# Patient Record
Sex: Male | Born: 1945 | Race: Black or African American | Hispanic: No | Smoking: Current every day smoker
Health system: Southern US, Community
[De-identification: ages and names within clinical notes are randomized; demographics above are authoritative.]

## PROBLEM LIST (undated history)

## (undated) DIAGNOSIS — E785 Hyperlipidemia, unspecified: Secondary | ICD-10-CM

## (undated) DIAGNOSIS — I10 Essential (primary) hypertension: Secondary | ICD-10-CM

## (undated) DIAGNOSIS — I509 Heart failure, unspecified: Secondary | ICD-10-CM

## (undated) DIAGNOSIS — Z992 Dependence on renal dialysis: Secondary | ICD-10-CM

## (undated) DIAGNOSIS — I4892 Unspecified atrial flutter: Secondary | ICD-10-CM

## (undated) DIAGNOSIS — M1990 Unspecified osteoarthritis, unspecified site: Secondary | ICD-10-CM

## (undated) DIAGNOSIS — N186 End stage renal disease: Secondary | ICD-10-CM

## (undated) HISTORY — PX: AV FISTULA PLACEMENT: SHX1204

## (undated) HISTORY — PX: BACK SURGERY: SHX140

---

## 2004-01-29 ENCOUNTER — Other Ambulatory Visit: Payer: Self-pay

## 2004-02-17 ENCOUNTER — Other Ambulatory Visit: Payer: Self-pay

## 2004-11-02 ENCOUNTER — Ambulatory Visit: Payer: Self-pay | Admitting: Nephrology

## 2005-01-25 ENCOUNTER — Ambulatory Visit: Payer: Self-pay | Admitting: Nephrology

## 2005-03-25 ENCOUNTER — Ambulatory Visit: Payer: Self-pay | Admitting: Nephrology

## 2005-10-27 ENCOUNTER — Emergency Department: Payer: Self-pay | Admitting: Emergency Medicine

## 2006-06-26 ENCOUNTER — Ambulatory Visit: Payer: Self-pay | Admitting: Nephrology

## 2006-07-02 ENCOUNTER — Ambulatory Visit: Payer: Self-pay | Admitting: Nephrology

## 2006-08-08 ENCOUNTER — Ambulatory Visit: Payer: Self-pay | Admitting: Nephrology

## 2006-08-11 ENCOUNTER — Ambulatory Visit: Payer: Self-pay | Admitting: Nephrology

## 2006-09-04 ENCOUNTER — Ambulatory Visit: Payer: Self-pay | Admitting: Vascular Surgery

## 2006-09-04 ENCOUNTER — Other Ambulatory Visit: Payer: Self-pay

## 2006-09-11 ENCOUNTER — Ambulatory Visit: Payer: Self-pay | Admitting: Vascular Surgery

## 2006-10-17 ENCOUNTER — Ambulatory Visit: Payer: Self-pay | Admitting: Vascular Surgery

## 2006-12-03 ENCOUNTER — Other Ambulatory Visit: Payer: Self-pay

## 2006-12-03 ENCOUNTER — Ambulatory Visit: Payer: Self-pay | Admitting: Vascular Surgery

## 2006-12-04 ENCOUNTER — Ambulatory Visit: Payer: Self-pay | Admitting: Vascular Surgery

## 2007-02-18 ENCOUNTER — Ambulatory Visit: Payer: Self-pay | Admitting: Vascular Surgery

## 2007-02-26 ENCOUNTER — Ambulatory Visit: Payer: Self-pay | Admitting: Vascular Surgery

## 2007-05-04 ENCOUNTER — Ambulatory Visit: Payer: Self-pay | Admitting: Vascular Surgery

## 2007-09-01 ENCOUNTER — Other Ambulatory Visit: Payer: Self-pay

## 2007-09-01 ENCOUNTER — Ambulatory Visit: Payer: Self-pay | Admitting: Ophthalmology

## 2007-09-08 ENCOUNTER — Ambulatory Visit: Payer: Self-pay | Admitting: Ophthalmology

## 2007-10-27 ENCOUNTER — Ambulatory Visit: Payer: Self-pay | Admitting: Ophthalmology

## 2007-11-10 ENCOUNTER — Ambulatory Visit: Payer: Self-pay | Admitting: Ophthalmology

## 2009-05-14 ENCOUNTER — Inpatient Hospital Stay: Payer: Self-pay | Admitting: Internal Medicine

## 2009-06-05 ENCOUNTER — Inpatient Hospital Stay: Payer: Self-pay | Admitting: Internal Medicine

## 2010-02-15 ENCOUNTER — Inpatient Hospital Stay: Payer: Self-pay | Admitting: Internal Medicine

## 2010-02-15 ENCOUNTER — Ambulatory Visit: Payer: Self-pay | Admitting: Cardiovascular Disease

## 2010-03-20 ENCOUNTER — Observation Stay: Payer: Self-pay | Admitting: Internal Medicine

## 2010-03-29 ENCOUNTER — Emergency Department: Payer: Self-pay | Admitting: Emergency Medicine

## 2010-07-20 ENCOUNTER — Observation Stay: Payer: Self-pay | Admitting: Internal Medicine

## 2010-08-06 ENCOUNTER — Emergency Department: Payer: Self-pay | Admitting: Emergency Medicine

## 2010-11-15 ENCOUNTER — Inpatient Hospital Stay: Payer: Self-pay | Admitting: Internal Medicine

## 2011-06-05 ENCOUNTER — Ambulatory Visit: Payer: Self-pay | Admitting: Vascular Surgery

## 2011-08-24 ENCOUNTER — Inpatient Hospital Stay: Payer: Self-pay | Admitting: Specialist

## 2011-09-05 ENCOUNTER — Ambulatory Visit: Payer: Self-pay | Admitting: Internal Medicine

## 2011-11-18 ENCOUNTER — Emergency Department: Payer: Self-pay | Admitting: Emergency Medicine

## 2012-02-17 ENCOUNTER — Emergency Department: Payer: Self-pay | Admitting: *Deleted

## 2012-02-17 LAB — CBC
HCT: 37.4 % — ABNORMAL LOW (ref 40.0–52.0)
HGB: 12.4 g/dL — ABNORMAL LOW (ref 13.0–18.0)
MCH: 31.4 pg (ref 26.0–34.0)
MCHC: 33.1 g/dL (ref 32.0–36.0)
MCV: 95 fL (ref 80–100)
RBC: 3.94 10*6/uL — ABNORMAL LOW (ref 4.40–5.90)
RDW: 15.8 % — ABNORMAL HIGH (ref 11.5–14.5)

## 2012-02-17 LAB — COMPREHENSIVE METABOLIC PANEL
Albumin: 3.9 g/dL (ref 3.4–5.0)
Alkaline Phosphatase: 65 U/L (ref 50–136)
BUN: 40 mg/dL — ABNORMAL HIGH (ref 7–18)
Bilirubin,Total: 0.6 mg/dL (ref 0.2–1.0)
Calcium, Total: 8.3 mg/dL — ABNORMAL LOW (ref 8.5–10.1)
Creatinine: 9.36 mg/dL — ABNORMAL HIGH (ref 0.60–1.30)
EGFR (African American): 7 — ABNORMAL LOW
EGFR (Non-African Amer.): 6 — ABNORMAL LOW
Glucose: 70 mg/dL (ref 65–99)
Osmolality: 286 (ref 275–301)
Potassium: 4.7 mmol/L (ref 3.5–5.1)
SGPT (ALT): 26 U/L
Sodium: 139 mmol/L (ref 136–145)

## 2012-02-17 LAB — RAPID INFLUENZA A&B ANTIGENS

## 2012-02-25 ENCOUNTER — Ambulatory Visit: Payer: Self-pay | Admitting: Vascular Surgery

## 2012-02-25 LAB — POTASSIUM: Potassium: 5.1 mmol/L (ref 3.5–5.1)

## 2013-07-07 ENCOUNTER — Ambulatory Visit: Payer: Self-pay | Admitting: Nephrology

## 2015-02-13 ENCOUNTER — Inpatient Hospital Stay: Payer: Self-pay | Admitting: Internal Medicine

## 2015-02-13 DIAGNOSIS — I361 Nonrheumatic tricuspid (valve) insufficiency: Secondary | ICD-10-CM

## 2015-04-16 NOTE — Op Note (Signed)
PATIENT NAME:  Brian Fleming, Brian Fleming MR#:  [id number] DATE OF BIRTH:  Jun 14, 1946  DATE OF PROCEDURE:  02/25/2012  PREOPERATIVE DIAGNOSES:  1. Poorly functioning dialysis access with difficulty in cannulation.  2. End-stage renal disease requiring hemodialysis.    POSTOPERATIVE DIAGNOSES: 1. Poorly functioning dialysis access with difficulty in cannulation.  2. End-stage renal disease requiring hemodialysis.   PROCEDURE PERFORMED: Contrast injection left arm radiocephalic fistula.   PROCEDURE PERFORMED BY: Katha Cabal, MD    SEDATION: Versed 2 mg plus fentanyl 50 mcg administered IV. Continuous ECG, pulse oximetry, and cardiopulmonary monitoring was performed throughout the entire procedure by the interventional radiology nurse. Total sedation time was 45 minutes.   ACCESS: 5 French micro sheath left wrist fistula near the arterial anastomosis, antegrade direction.   CONTRAST USED: Isovue 20 mL.   FLUORO TIME: 0.3 minutes.   INDICATIONS: Mr. Cooling is a 69 year old gentleman who presents to [place] referred from the dialysis center. They have had difficulties with cannulation. Risks and benefits for contrast injection were reviewed. All questions were answered. The patient has agreed to proceed.   PROCEDURE: The patient is taken to Special Procedures and placed in the supine position. After adequate sedation is achieved, he is positioned with his left arm extended palm upward and prepped and draped in a sterile fashion. 1% lidocaine is infiltrated in the soft tissues near the anastomosis and in an antegrade direction a micropuncture needle is used to access the fistula. Microwire followed by micro sheath is inserted. Stopcock is placed. Images of the fistula and the proximal venous anatomy as well as the central venous anatomy are obtained with hand injection. Compression is used to visualize the arterial anastomosis. After images are reviewed, the sheath is pulled, light pressure is held, and  there are no immediate complications.   INTERPRETATION: Images of the fistula demonstrate the fistula itself is widely patent. Arterial anastomosis and the visualized portions of the brachial aorta is widely patent. Areas of the buttonholes appear to be matured and widely patent. More proximal venous anatomy and central venous anatomy does not demonstrate any significant strictures or stenoses.   SUMMARY: Adequate AV fistula for continued dialysis.  ____________________________ Katha Cabal, MD ggs:drc D: 02/26/2012 09:08:19 ET T: 02/26/2012 09:44:29 ET JOB#: [id number]  cc: Katha Cabal, MD, <Dictator> Mikeal Hawthorne. Brynda Greathouse, MD Katha Cabal MD ELECTRONICALLY SIGNED 02/28/2012 10:48

## 2015-04-23 NOTE — Discharge Summary (Signed)
PATIENT NAME:  Brian Fleming, Brian Fleming MR#:  [id number] DATE OF BIRTH:  1946/11/14  DATE OF ADMISSION:  02/13/2015 DATE OF DISCHARGE:  02/14/2015  ADMITTING PHYSICIAN: Gladstone Lighter, MD  DISCHARGING PHYSICIAN: Gladstone Lighter, MD  PRIMARY CARE PHYSICIAN: Nicky Pugh, MD  PRIMARY NEPHROLOGIST: Dr. Johnney Ou at [facility].   CONSULTATIONS IN HOSPITAL:  Nephrology by Dr. Murlean Iba.   DISCHARGE DIAGNOSES: 1.  Acute hypoxic respiratory failure.  2.  Pulmonary edema.  3.  Congestive heart failure, likely diastolic dysfunction.  4.  Hypertension.  5.  End-stage renal disease on Monday, Wednesday, Friday hemodialysis.  6.  Gastroesophageal reflex disease.   DISCHARGE HOME MEDICATIONS:  1.  Nephro-Vite 1 tablet p.o. daily.  2.  Renvela 800 mg tablets 1 tablet twice a day as needed with snacks.  3.  Renvela 800 mg tablets 3 tablets 3 times a day with meals.  4.  Sensipar 90 mg p.o. daily.  5.  Norvasc 10 mg p.o. daily.  6.  Ramipril 10 mg p.o. daily.  7.  Omeprazole 20 mg 2 capsules p.o. daily.  8.  Tramadol 50 mg p.o. b.i.d. p.r.n. for pain.  9.  Hydroxyzine 25 mg p.o. b.i.d. p.r.n. for itching.  10.  Benzonatate capsules 200 mg b.i.d. p.r.n. for cough.   DISCHARGE DIET: Renal diet.   DISCHARGE ACTIVITY: As tolerated.  FOLLOWUP INSTRUCTIONS: 1.  Follow up for dialysis per schedule on 02/15/2015.  2.  PCP to evaluate for oxygen in the near future.  3.  PCP follow-up in 1 to 2 weeks.   DIAGNOSTIC DATA: Prior to discharge: WBC 4.3, hemoglobin 10, hematocrit 31.4, platelet count 111,000.   Sodium 140, potassium 4.2, chloride 98, bicarb 31, BUN 50, creatinine 11.3, glucose 92 and calcium 8.6. Troponins barely elevated at 0.09.   Echo Doppler showing LV ejection fraction is 55% to 60%, impaired diastolic function.   Blood cultures negative.   Chest x-ray on admission: Findings consistent with CHF.   BRIEF HOSPITAL COURSE: [name] is a 69 year old African American male  with end-stage renal disease on Monday, Wednesday and Friday hemodialysis, and hypertension, who presented to the hospital on a Monday secondary to worsening shortness of breath. The patient states his dialysis session on Friday was normal. Over the weekend his breathing got worse, however noncompliant with diet and fluid intake. Chest x-ray was consistent with pulmonary edema.   Acute pulmonary edema secondary to noncompliance, diastolic dysfunction based on echo and also dialysis patient. He was dialyzed the day of admission, on Monday, and also on Tuesday to get more fluid off of him. His breathing has improved. He required 2 liters of O2 on admission, none at the time of discharge. However, the patient did mention that during each dialysis session, even as an outpatient, he is placed on oxygen, so he is advised to follow up with his PCP to see if he would qualify for any home O2 in the near future. His other home medications are being continued without any changes. His course has been otherwise stable.   DISCHARGE CONDITION: Stable.   DISCHARGE DISPOSITION: Home.   TIME SPENT ON DISCHARGE: 40 minutes.   ____________________________ Gladstone Lighter, MD rk:sb D: 02/16/2015 16:24:10 ET T: 02/17/2015 11:28:03 ET JOB#: [id number]  cc: Gladstone Lighter, MD, <Dictator> Mikeal Hawthorne. Brynda Greathouse, MD Justin Mend, MD Gladstone Lighter MD ELECTRONICALLY SIGNED 03/09/2015 17:43

## 2015-04-23 NOTE — H&P (Signed)
PATIENT NAME:  Brian Fleming, Brian Fleming MR#:  [id number] DATE OF BIRTH:  30-Apr-1946  DATE OF ADMISSION:  02/13/2015  ADMITTING PHYSICIAN: Gladstone Lighter, M.D.   PRIMARY CARE PHYSICIAN:  Nicky Pugh, MD  PRIMARY NEPHROLOGIST: Jannifer Hick, MD at [facility].   CHIEF COMPLAINT: Difficulty breathing.   HISTORY OF PRESENT ILLNESS: Brian Fleming is a 69 year old African American male with past medical history significant for hypertension, remote history of alcohol abuse, end-stage renal disease on hemodialysis for almost 11 years now. Presents to the hospital secondary to difficulty breathing going on for 2 days but worse last night. The patient says he is on a Monday, Wednesday, Friday dialysis and had his regular dialysis treatment on this past Friday. Since Saturday morning he has been having trouble breathing. No chest pain at the time. He said he might have been drinking too much soda at home. He denies any increased salt intake. He felt by yesterday his symptoms got worse. He could not sleep for 2 days in a row.  Last night he could not sleep because of his breathing getting worse and he was also having some chest tightness and presented to the hospital. The patient is not on any home oxygen but it seems they use oxygen for him during dialysis. Currently he is hypoxic with saturations 88% on 3 liters oxygen. He is due for dialysis today anyways and nephrology has been notified. His chest x-ray indicated pulmonary edema and he is being admitted for the same.   PAST MEDICAL HISTORY: 1.  End-stage renal disease on hemodialysis for 11 years.  2.  Hypertension.  3.  Gastroesophageal reflex disease.   PAST SURGICAL HISTORY:  1.  Hemorrhoidectomy.  2.  Bilateral cataract surgery.   ALLERGIES: No known drug allergies.   HOME MEDICATIONS:  1.  Nephro-Vite 1 tablet p.o. daily.  2.  Renvela 800 mg 3 tablets p.o. 3 times a day with meals and 1 tablet with snacks once a day.  3.  Omeprazole 40 mg p.o.  daily.  4.  Ramipril 10 mg p.o. daily.  5.  Hydroxyzine 25 mg p.o. b.i.d. p.r.n.  6.  Sensipar 90 mg p.o. daily.  7.  Norvasc 10 mg p.o. at bedtime.  8.  Tramadol 50 mg p.o. b.i.d. p.r.n.   SOCIAL HISTORY: He lives at home by himself.  He is ambulatory at baseline without any assistive devices. He is not on home oxygen. He used to drink a lot of alcohol about half a gallon and a pint of gin but quit more than 15 years ago. No current alcohol use. He continues to smoke but 1 pack lasts for almost a week.    FAMILY HISTORY: Father died of natural causes at old age. Mother died from suicide when she was in her 3s.  REVIEW OF SYSTEMS: CONSTITUTIONAL: No fever, fatigue, or weakness.  EYES: No blurred vision, double vision, or inflammation.  ENT: No tinnitus, ear pain, hearing loss, epistaxis, or discharge.  RESPIRATORY: Positive for cough, difficulty breathing, and wheezing. No chronic obstructive pulmonary disease.  CARDIOVASCULAR: Some chest tightness. No arrhythmia, palpitations, or syncope.  GASTROINTESTINAL: No nausea, vomiting, diarrhea, abdominal pain, hematemesis, or melena.  GENITOURINARY: The patient is oliguric at baseline. No frequency or incontinence.  ENDOCRINE: No polyuria, nocturia, thyroid problems, heat or cold intolerance.  HEMATOLOGY: No anemia, easy bruising, or bleeding.  SKIN: No acne, rash, or lesions.  MUSCULOSKELETAL: No neck, back, shoulder pain, arthritis, or gout.  NEUROLOGIC: No numbness, weakness, CVA, transient ischemic attack,  or seizures.  PSYCHOLOGICAL: No anxiety, insomnia, or depression.   PHYSICAL EXAMINATION:  VITAL SIGNS: Temperature 98.5 degrees Fahrenheit, pulse 91, respirations 27, blood pressure 178/90, pulse of 88% on 3 liters oxygen.  GENERAL: Well-built, well-nourished man lying in bed, not in any acute distress.  HEENT: Normocephalic, atraumatic. Pupils equal, round, and reacting to light. Anicteric sclerae, extraocular movements intact.   OROPHARYNX: Clear without erythema, mass, or exudates.  NECK: Supple. No thyromegaly, JVD or carotid bruits. No lymphadenopathy.  LUNGS: Diffuse course rhonchi/crackles heard throughout the lungs. No use of accessory muscles for breathing. No wheezing.  CARDIOVASCULAR: S1, S2, regular rate and rhythm. No murmurs, rubs, or gallops.  ABDOMEN: Soft, nontender, nondistended. No hepatosplenomegaly. Normal bowel sounds.  EXTREMITIES: 2+ pedal edema present. No clubbing or cyanosis. Dorsalis pedis pulses palpable bilaterally.  SKIN: No acne, rash, or lesions.  LYMPHATICS: No cervical lymphadenopathy.  NEUROLOGIC: Cranial nerves intact. No sequelae.  No motor or sensory deficit.  PSYCHOLOGICAL: The patient is awake, alert, and oriented x3.   LABORATORY DATA: WBC 7.1, hemoglobin 10.3, hematocrit 39.4, platelet count 126,000.  Sodium 141, potassium 4.2, chloride 98, bicarbonate 30, BUN 17, creatinine 13.66, glucose 100, and calcium of 8.1. ALT 13, AST 20, alkaline phosphatase 122, total bilirubin 0.4, albumin of 3.2, troponin 0.06. Chest x-ray showing cardiomegaly and an element of pulmonary edema. No pneumothorax. No acute osseous abnormalities noted. CK, CK-MB within normal limits.  EKG showing normal sinus rhythm, heart rate of 93, no acute ST-T wave abnormalities.   ASSESSMENT AND PLAN: A 69 year old male with hypertension, endstage renal disease on Monday, Wednesday, Friday hemodialysis, comes in hypoxic respiratory failure secondary to congestive heart failure exacerbation and acute pulmonary edema.  1.  Acute hypoxic respiratory failure secondary to pulmonary edema and congestive heart failure exacerbation. He seems to be on 3 liters oxygen now.  He does not use oxygen at home though they use oxygen for him during dialysis. He might need to be qualified for oxygen at home. We will dialyze him and check his saturations when he is off the oxygen. He will go home and follow-up with [facility] for home O2 needs.  Chest x-ray showing pulmonary edema as mentioned above. We will get dialysis today. Also an echocardiogram has been ordered.  2.  End-stage renal disease on Monday, Wednesday, and Friday hemodialysis.  Last hemodialysis on Friday.  3.  Hemodialysis today per schedule. Nephrology has been noticed for the need for urgent dialysis. Continue his supplements.  4.  Hypertension. Continue home medications.  5.  Deep vein thrombosis prophylaxis with subcutaneous heparin.   CODE STATUS: Full code.   TIME SPENT ON ADMISSION: 50 minutes.   ____________________________ Gladstone Lighter, MD rk:mc D: 02/13/2015 07:55:01 ET T: 02/13/2015 08:25:03 ET JOB#: [id number]  cc: Gladstone Lighter, MD, <Dictator> Mikeal Hawthorne. Brynda Greathouse, MD Justin Mend, MD  Gladstone Lighter MD ELECTRONICALLY SIGNED 03/09/2015 17:12

## 2015-08-20 ENCOUNTER — Emergency Department: Payer: Medicare Other

## 2015-08-20 ENCOUNTER — Inpatient Hospital Stay
Admission: EM | Admit: 2015-08-20 | Discharge: 2015-08-22 | DRG: 291 | Disposition: A | Discharge status: Home / Self Care | Payer: Medicare Other | Attending: Internal Medicine | Admitting: Internal Medicine

## 2015-08-20 ENCOUNTER — Encounter: Payer: Self-pay | Admitting: Emergency Medicine

## 2015-08-20 DIAGNOSIS — I5033 Acute on chronic diastolic (congestive) heart failure: Principal | ICD-10-CM | POA: Diagnosis present

## 2015-08-20 DIAGNOSIS — M1990 Unspecified osteoarthritis, unspecified site: Secondary | ICD-10-CM | POA: Diagnosis present

## 2015-08-20 DIAGNOSIS — F17210 Nicotine dependence, cigarettes, uncomplicated: Secondary | ICD-10-CM | POA: Diagnosis present

## 2015-08-20 DIAGNOSIS — Z992 Dependence on renal dialysis: Secondary | ICD-10-CM

## 2015-08-20 DIAGNOSIS — Z79899 Other long term (current) drug therapy: Secondary | ICD-10-CM | POA: Diagnosis not present

## 2015-08-20 DIAGNOSIS — I509 Heart failure, unspecified: Secondary | ICD-10-CM

## 2015-08-20 DIAGNOSIS — N186 End stage renal disease: Secondary | ICD-10-CM | POA: Diagnosis present

## 2015-08-20 DIAGNOSIS — J810 Acute pulmonary edema: Secondary | ICD-10-CM | POA: Diagnosis present

## 2015-08-20 DIAGNOSIS — Z9889 Other specified postprocedural states: Secondary | ICD-10-CM

## 2015-08-20 DIAGNOSIS — I120 Hypertensive chronic kidney disease with stage 5 chronic kidney disease or end stage renal disease: Secondary | ICD-10-CM | POA: Diagnosis present

## 2015-08-20 DIAGNOSIS — J9601 Acute respiratory failure with hypoxia: Secondary | ICD-10-CM | POA: Diagnosis present

## 2015-08-20 DIAGNOSIS — N2581 Secondary hyperparathyroidism of renal origin: Secondary | ICD-10-CM | POA: Diagnosis present

## 2015-08-20 DIAGNOSIS — R0902 Hypoxemia: Secondary | ICD-10-CM

## 2015-08-20 DIAGNOSIS — I959 Hypotension, unspecified: Secondary | ICD-10-CM | POA: Diagnosis present

## 2015-08-20 DIAGNOSIS — K5900 Constipation, unspecified: Secondary | ICD-10-CM | POA: Diagnosis present

## 2015-08-20 DIAGNOSIS — D631 Anemia in chronic kidney disease: Secondary | ICD-10-CM | POA: Diagnosis present

## 2015-08-20 DIAGNOSIS — E876 Hypokalemia: Secondary | ICD-10-CM | POA: Diagnosis present

## 2015-08-20 DIAGNOSIS — E785 Hyperlipidemia, unspecified: Secondary | ICD-10-CM | POA: Diagnosis present

## 2015-08-20 HISTORY — DX: Dependence on renal dialysis: Z99.2

## 2015-08-20 HISTORY — DX: Unspecified osteoarthritis, unspecified site: M19.90

## 2015-08-20 HISTORY — DX: Heart failure, unspecified: I50.9

## 2015-08-20 HISTORY — DX: Dependence on renal dialysis: N18.6

## 2015-08-20 HISTORY — DX: Hyperlipidemia, unspecified: E78.5

## 2015-08-20 HISTORY — DX: Essential (primary) hypertension: I10

## 2015-08-20 LAB — CBC WITH DIFFERENTIAL/PLATELET
Basophils Absolute: 0 10*3/uL (ref 0–0.1)
Basophils Relative: 1 %
EOS ABS: 0.1 10*3/uL (ref 0–0.7)
EOS PCT: 3 %
HCT: 31.8 % — ABNORMAL LOW (ref 40.0–52.0)
Hemoglobin: 10.5 g/dL — ABNORMAL LOW (ref 13.0–18.0)
LYMPHS ABS: 0.7 10*3/uL — AB (ref 1.0–3.6)
LYMPHS PCT: 17 %
MCH: 29.8 pg (ref 26.0–34.0)
MCHC: 32.8 g/dL (ref 32.0–36.0)
MCV: 90.8 fL (ref 80.0–100.0)
MONO ABS: 0.3 10*3/uL (ref 0.2–1.0)
Monocytes Relative: 6 %
Neutro Abs: 3 10*3/uL (ref 1.4–6.5)
Neutrophils Relative %: 73 %
PLATELETS: 159 10*3/uL (ref 150–440)
RBC: 3.5 MIL/uL — AB (ref 4.40–5.90)
RDW: 15.4 % — AB (ref 11.5–14.5)
WBC: 4.2 10*3/uL (ref 3.8–10.6)

## 2015-08-20 LAB — COMPREHENSIVE METABOLIC PANEL
ALT: 8 U/L — AB (ref 17–63)
AST: 14 U/L — ABNORMAL LOW (ref 15–41)
Albumin: 3.8 g/dL (ref 3.5–5.0)
Alkaline Phosphatase: 88 U/L (ref 38–126)
Anion gap: 13 (ref 5–15)
BUN: 27 mg/dL — AB (ref 6–20)
CALCIUM: 10.1 mg/dL (ref 8.9–10.3)
CO2: 30 mmol/L (ref 22–32)
Chloride: 99 mmol/L — ABNORMAL LOW (ref 101–111)
Creatinine, Ser: 10.33 mg/dL — ABNORMAL HIGH (ref 0.61–1.24)
GFR calc non Af Amer: 5 mL/min — ABNORMAL LOW (ref >60)
GFR, EST AFRICAN AMERICAN: 5 mL/min — AB (ref >60)
GLUCOSE: 89 mg/dL (ref 65–99)
Potassium: 3.2 mmol/L — ABNORMAL LOW (ref 3.5–5.1)
Sodium: 142 mmol/L (ref 135–145)
Total Bilirubin: 0.8 mg/dL (ref 0.3–1.2)
Total Protein: 7.3 g/dL (ref 6.5–8.1)

## 2015-08-20 LAB — TROPONIN I: Troponin I: 0.04 ng/mL — ABNORMAL HIGH (ref <0.031)

## 2015-08-20 LAB — GLUCOSE, CAPILLARY: Glucose-Capillary: 105 mg/dL — ABNORMAL HIGH (ref 65–99)

## 2015-08-20 MED ORDER — ACETAMINOPHEN 650 MG RE SUPP: 650.0000 mg | Freq: Four times a day (QID) | RECTAL | Status: DC | PRN

## 2015-08-20 MED ORDER — AMLODIPINE BESYLATE 10 MG PO TABS
10.0000 mg | ORAL_TABLET | Freq: Every day | ORAL | Status: DC
  Administered 2015-08-20 – 2015-08-21 (×2): 10 mg via ORAL
  Filled 2015-08-20 (×2): qty 1

## 2015-08-20 MED ORDER — FUROSEMIDE 10 MG/ML IJ SOLN
80.0000 mg | Freq: Once | INTRAMUSCULAR | Status: AC
  Administered 2015-08-20: 80 mg via INTRAVENOUS
  Filled 2015-08-20: qty 8

## 2015-08-20 MED ORDER — ONDANSETRON HCL 4 MG PO TABS: 4.0000 mg | ORAL_TABLET | Freq: Four times a day (QID) | ORAL | Status: DC | PRN

## 2015-08-20 MED ORDER — HEPARIN SODIUM (PORCINE) 5000 UNIT/ML IJ SOLN
5000.0000 [IU] | Freq: Three times a day (TID) | INTRAMUSCULAR | Status: DC
  Administered 2015-08-20 – 2015-08-22 (×5): 5000 [IU] via SUBCUTANEOUS
  Filled 2015-08-20 (×5): qty 1

## 2015-08-20 MED ORDER — ONDANSETRON HCL 4 MG/2ML IJ SOLN: 4.0000 mg | Freq: Four times a day (QID) | INTRAMUSCULAR | Status: DC | PRN

## 2015-08-20 MED ORDER — CINACALCET HCL 30 MG PO TABS
60.0000 mg | ORAL_TABLET | Freq: Every day | ORAL | Status: DC
  Administered 2015-08-21 – 2015-08-22 (×2): 60 mg via ORAL
  Filled 2015-08-20 (×2): qty 2

## 2015-08-20 MED ORDER — SEVELAMER CARBONATE 800 MG PO TABS
2400.0000 mg | ORAL_TABLET | Freq: Three times a day (TID) | ORAL | Status: DC
  Administered 2015-08-21 – 2015-08-22 (×4): 2400 mg via ORAL
  Filled 2015-08-20 (×4): qty 3

## 2015-08-20 MED ORDER — EPOETIN ALFA 10000 UNIT/ML IJ SOLN
4000.0000 [IU] | Freq: Once | INTRAMUSCULAR | Status: AC
  Administered 2015-08-20: 4000 [IU] via INTRAVENOUS

## 2015-08-20 MED ORDER — ACETAMINOPHEN 325 MG PO TABS: 650.0000 mg | ORAL_TABLET | Freq: Four times a day (QID) | ORAL | Status: DC | PRN

## 2015-08-20 MED ORDER — TRAMADOL HCL 50 MG PO TABS: 50.0000 mg | ORAL_TABLET | Freq: Four times a day (QID) | ORAL | Status: DC | PRN

## 2015-08-20 NOTE — Progress Notes (Signed)
Pre-hd tx 

## 2015-08-20 NOTE — ED Provider Notes (Signed)
[facility] Emergency Department Provider Note  Time seen: 11:04 AM  I have reviewed the triage vital signs and the nursing notes.   HISTORY  Chief Complaint Shortness of Breath    HPI Brian Fleming is a 69 y.o. male with a past medical history of end-stage renal disease on hemodialysis Monday, Wednesday, Friday presents the emergency department with shortness of breath. According to the patient he last received dialysis on Friday, but the machine malfunctioned and he only got half of his treatment. He states today he started feeling considerably short of breath so he came to the emergency department. Denies any chest pain or discomfort. Does not wear oxygen at home. On arrival to the emergency department the patient has a 76% oxygen saturation on room air. Patient denies any cough, congestion, fever.Describes his shortness of breath is moderate, worse with exertion.     Past Medical History  Diagnosis Date  . Renal disorder     There are no active problems to display for this patient.   Past Surgical History  Procedure Laterality Date  . Av fistula placement      No current outpatient prescriptions on file.  Allergies Review of patient's allergies indicates not on file.  No family history on file.  Social History Social History  Substance Use Topics  . Smoking status: Not on file  . Smokeless tobacco: Not on file  . Alcohol Use: Not on file    Review of Systems Constitutional: Negative for fever. Cardiovascular: Negative for chest pain. Respiratory: Positive shortness of breath. Gastrointestinal: Negative for abdominal pain, vomiting and diarrhea Neurological: Negative for headache 10-point ROS otherwise negative.  ____________________________________________   PHYSICAL EXAM:  VITAL SIGNS: ED Triage Vitals  Enc Vitals Group     BP --      Pulse Rate 08/20/15 1058 94     Resp 08/20/15 1058 16     Temp --      Temp src --       SpO2 08/20/15 1058 76 %     Weight 08/20/15 1058 229 lb (103.874 kg)     Height 08/20/15 1058 5\' 8"  (1.727 m)     Head Cir --      Peak Flow --      Pain Score 08/20/15 1058 0     Pain Loc --      Pain Edu? --      Excl. in [place]? --     Constitutional: Alert and oriented. Well appearing and in no distress. No respiratory distress. Eyes: Normal exam ENT   Mouth/Throat: Mucous membranes are moist. Cardiovascular: Normal rate, regular rhythm. No murmur Respiratory: Normal respiratory effort without tachypnea nor retractions. Breath sounds are clear and equal bilaterally. No rhonchi auscultated. Gastrointestinal: Soft and nontender. No distention.   Musculoskeletal: Nontender with normal range of motion in all extremities.  Neurologic:  Normal speech and language. No gross focal neurologic deficits  Skin:  Skin is warm, dry and intact.  Psychiatric: Mood and affect are normal. Speech and behavior are normal. Patient exhibits appropriate insight and judgment.  ____________________________________________    EKG  EKG reviewed and interpreted by myself shows atrial flutter at 96 bpm, narrow QRS, normal axis, nonspecific ST changes present. Including some mild lateral ST depression, no ST elevations noted.  ____________________________________________    RADIOLOGY  Interstitial edema chest x-ray.  ____________________________________________    INITIAL IMPRESSION / ASSESSMENT AND PLAN / ED COURSE  Pertinent labs & imaging results that were  available during my care of the patient were reviewed by me and considered in my medical decision making (see chart for details).  Patient with shortness of breath, hypoxic to 76% on arrival to the emergency department. We'll check labs, chest x-ray and closely monitor in the emergency department. Patient satting well on 5 L O2. No distress, no respiratory distress. Calm and cooperative.  Interstitial edema chest x-ray, labs otherwise  close to patient's baseline, slight troponin elevation 0.04. We will admit the patient for hypoxia likely requiring dialysis.  ____________________________________________   FINAL CLINICAL IMPRESSION(S) / ED DIAGNOSES  Dyspnea Hypoxia   Harvest Dark, MD 08/20/15 1353

## 2015-08-20 NOTE — ED Notes (Signed)
Pt arrived via EMS from home, states on Friday during dialysis the machine malfunctioned and was not able to complete the entire treatment.  Usual amount off is 500, but only took off 200. Pt reports starting to feel short of breath yesterday morning. Non-dependent on oxygen at home.

## 2015-08-20 NOTE — ED Notes (Signed)
Attempt x 3 to establish IV access and draw blood work.  Unable.  Dr. Kerman Passey alerted, will evaluate patient.

## 2015-08-20 NOTE — Progress Notes (Signed)
Post hd tx 

## 2015-08-20 NOTE — Progress Notes (Signed)
Hd tx start  

## 2015-08-20 NOTE — Progress Notes (Signed)
Subjective:  Patient presents from home for shortness of breath. He is known to our service from previous admissions. He reports that he did not miss any dialysis treatments. His last dialysis treatment was Friday  he was noted to be hypoxic in the emergency room with 76% on room air. He was placed on oxygen supplementation. He denies any fevers or chills. He vomited on Friday.     Objective:   Marland Kitchen Vital signs in last 24 hours:  Temp:  [98.6 F (37 C)] 98.6 F (37 C) (08/28 1554) Pulse Rate:  [77-94] 80 (08/28 1554) Resp:  [11-24] 20 (08/28 1554) BP: (171-203)/(82-100) 203/94 mmHg (08/28 1554) SpO2:  [76 %-100 %] 91 % (08/28 1554) Weight:  [103.874 kg (229 lb)] 103.874 kg (229 lb) (08/28 1058)  Weight change:  Filed Weights   08/20/15 1058  Weight: 103.874 kg (229 lb)    Intake/Output:   No intake or output data in the 24 hours ending 08/20/15 1603   Physical Exam: General:  no acute distress, laying in the bed   HEENT  anicteric, moist mucous membranes   Neck  supple, no masses   Pulm/lungs  bilateral diffuse crackles   CVS/Heart  regular rhythm no rub or gallop   Abdomen:   soft nontender   Extremities:  trace peripheral edema   Neurologic:  alert, oriented, speech normal   Skin:  no acute rashes   Access:  left forearm AV fistula        Basic Metabolic Panel:   Recent Labs Lab 08/20/15 1247  NA 142  K 3.2*  CL 99*  CO2 30  GLUCOSE 89  BUN 27*  CREATININE 10.33*  CALCIUM 10.1     CBC:  Recent Labs Lab 08/20/15 1247  WBC 4.2  NEUTROABS 3.0  HGB 10.5*  HCT 31.8*  MCV 90.8  PLT 159      Microbiology:  No results found for this or any previous visit (from the past 720 hour(s)).  Coagulation Studies: No results for input(s): LABPROT, INR in the last 72 hours.  Urinalysis: No results for input(s): COLORURINE, LABSPEC, PHURINE, GLUCOSEU, HGBUR, BILIRUBINUR, KETONESUR, PROTEINUR, UROBILINOGEN, NITRITE, LEUKOCYTESUR in the last 72  hours.  Invalid input(s): APPERANCEUR    Imaging: Dg Chest Portable 1 View  08/20/2015   CLINICAL DATA:  Shortness of breath starting yesterday.  EXAM: PORTABLE CHEST - 1 VIEW  COMPARISON:  February 13, 2015  FINDINGS: The heart size and mediastinal contours are stable. The heart size is enlarged. There is mild interstitial pulmonary edema. There are small bilateral pleural effusions. There is no focal pneumonia appear The visualized skeletal structures are unremarkable.  IMPRESSION: Mild interstitial pulmonary edema with small bilateral pleural effusions.   Electronically Signed   By: Abelardo Diesel M.D.   On: 08/20/2015 11:29     Medications:     . amLODipine  10 mg Oral QHS  . [START ON 08/21/2015] cinacalcet  60 mg Oral QPC breakfast  . furosemide  80 mg Intravenous Once  . heparin  5,000 Units Subcutaneous 3 times per day  . sevelamer carbonate  2,400 mg Oral TID WC   acetaminophen **OR** acetaminophen, ondansetron **OR** ondansetron (ZOFRAN) IV, traMADol  Assessment/ Plan:  69 y.o. African-American male with end-stage renal disease, hypertension, hyperlipidemia, degenerative disc disease presents for acute shortness of breath and is found to be hypoxic. He is admitted for urgent dialysis  1. End-stage renal disease. [place]. Monday Wednesday Friday. [facility] physicians. Patient is  acutely short of breath and is requiring oxygen supplementation We will arrange for urgent dialysis tonight. I have contacted the dialysis nurse on call. 2. Acute pulmonary edema Urgent dialysis tonight Ultrafiltration as tolerated 3. Anemia of chronic kidney disease. Current hemoglobin 10.5 We will start low-dose Procrit 4. Hypertension Likely volume driven Reassess after dialysis 5. Secondary hyperparathyroidism Monitor phosphorus during this admission.   LOS: 0 Sahithi Ordoyne 8/28/20164:03 PM

## 2015-08-20 NOTE — ED Notes (Signed)
Respirations regular and non labored.  Decreased O2 to 2L/Rake.  Continue to monitor.

## 2015-08-20 NOTE — Progress Notes (Signed)
HD tx end  

## 2015-08-20 NOTE — H&P (Signed)
Brian Fleming at [place] NAME: Brian Fleming    MR#:  [id number]  DATE OF BIRTH:  Sep 20, 1946  DATE OF ADMISSION:  08/20/2015  PRIMARY CARE PHYSICIAN: Marden Noble, MD   REQUESTING/REFERRING PHYSICIAN: Dr. Harvest Dark  CHIEF COMPLAINT:   Chief Complaint  Patient presents with  . Shortness of Breath    HISTORY OF PRESENT ILLNESS:  Brian Fleming  is a 69 y.o. male with a known history of end-stage renal disease on hemodialysis, hypertension, history of CHF, hyperlipidemia, degenerative disc disease, who presents to the hospital due to shortness of breath and noted to be hypoxic. Patient had his scheduled dialysis on Friday but as per the patient the only removed 200 cc of fluid and normally they remove 500 cc.  Patient's dialysis was cut short as the machine wasn't functioning properly. Patient then woke up Saturday morning feeling a bit short of breath but improved as the day progressed. This morning he woke up again with worsening shortness of breath and therefore came to the ER for further evaluation. Patient was noted to be hypoxic with O2 sats in the mid 70s on room air and had chest x-ray findings suggestive of pulmonary edema and bilateral pleural effusions. He was also noted to be slightly hypotensive but systolic blood pressures in 200s to 190s. Hospitalist services were contacted for further treatment and evaluation. Patient denies any recent increased salt intake, chest pain, nausea, vomiting, fever, chills or any other associated symptoms presently.  PAST MEDICAL HISTORY:   Past Medical History  Diagnosis Date  . End stage renal disease on dialysis   . Hypertension   . CHF (congestive heart failure)   . Hyperlipidemia   . DJD (degenerative joint disease)     PAST SURGICAL HISTORY:   Past Surgical History  Procedure Laterality Date  . Av fistula placement    . Back surgery      SOCIAL HISTORY:   Social  History  Substance Use Topics  . Smoking status: Current Every Day Smoker -- 0.25 packs/day    Types: Cigarettes  . Smokeless tobacco: Not on file  . Alcohol Use: No    FAMILY HISTORY:   Family History  Problem Relation Age of Onset  . Family history unknown: Yes    DRUG ALLERGIES:  No Known Allergies  REVIEW OF SYSTEMS:   Review of Systems  Constitutional: Negative for fever and weight loss.  HENT: Negative for congestion, nosebleeds and tinnitus.   Eyes: Negative for blurred vision, double vision and redness.  Respiratory: Positive for cough and shortness of breath. Negative for hemoptysis and sputum production.   Cardiovascular: Negative for chest pain, orthopnea, leg swelling and PND.  Gastrointestinal: Negative for nausea, vomiting, abdominal pain, diarrhea and melena.  Genitourinary: Negative for dysuria, urgency and hematuria.  Musculoskeletal: Negative for joint pain and falls.  Neurological: Negative for dizziness, tingling, sensory change, focal weakness, seizures, weakness and headaches.  Endo/Heme/Allergies: Negative for polydipsia. Does not bruise/bleed easily.  Psychiatric/Behavioral: Negative for depression and memory loss. The patient is not nervous/anxious.     MEDICATIONS AT HOME:   Prior to Admission medications   Medication Sig Start Date End Date Taking? Authorizing Provider  amLODipine (NORVASC) 10 MG tablet Take 10 mg by mouth at bedtime. 04/29/14  Yes Historical Provider, MD  cinacalcet (SENSIPAR) 60 MG tablet Take 60 mg by mouth daily.   Yes Historical Provider, MD  sevelamer carbonate (RENVELA) 800 MG tablet Take  2,400 mg by mouth See admin instructions. Take 3 tablets (2400mg ) orally 3 times a day with meals and take 1 tablet orally with a snack.   Yes Historical Provider, MD  traMADol (ULTRAM) 50 MG tablet Take by mouth every 8 (eight) hours as needed for moderate pain or severe pain.   Yes Historical Provider, MD      VITAL SIGNS:  Blood  pressure 191/88, pulse 77, resp. rate 16, height 5\' 8"  (1.727 m), weight 103.874 kg (229 lb), SpO2 100 %.  PHYSICAL EXAMINATION:  Physical Exam  GENERAL:  69 y.o.-year-old patient lying in the bed with no acute distress.  EYES: Pupils equal, round, reactive to light and accommodation. No scleral icterus. Extraocular muscles intact.  HEENT: Head atraumatic, normocephalic. Oropharynx and nasopharynx clear. No oropharyngeal erythema, moist oral mucosa  NECK:  Supple, no jugular venous distention. No thyroid enlargement, no tenderness.  LUNGS: Normal breath sounds bilaterally, no wheezing, rhonchi, + bibasilar rales. No use of accessory muscles of respiration.  CARDIOVASCULAR: S1, S2 RRR. No murmurs, rubs, gallops, clicks.  ABDOMEN: Soft, nontender, nondistended. Bowel sounds present. No organomegaly or mass.  EXTREMITIES: No pedal edema, cyanosis, or clubbing. + 2 pedal & radial pulses b/l.   NEUROLOGIC: Cranial nerves II through XII are intact. No focal Motor or sensory deficits appreciated b/l PSYCHIATRIC: The patient is alert and oriented x 3. Good affect.  SKIN: No obvious rash, lesion, or ulcer.   Left upper extremity AV fistula with good bruit and thrill.  LABORATORY PANEL:   CBC  Recent Labs Lab 08/20/15 1247  WBC 4.2  HGB 10.5*  HCT 31.8*  PLT 159   ------------------------------------------------------------------------------------------------------------------  Chemistries   Recent Labs Lab 08/20/15 1247  NA 142  K 3.2*  CL 99*  CO2 30  GLUCOSE 89  BUN 27*  CREATININE 10.33*  CALCIUM 10.1  AST 14*  ALT 8*  ALKPHOS 88  BILITOT 0.8   ------------------------------------------------------------------------------------------------------------------  Cardiac Enzymes  Recent Labs Lab 08/20/15 1247  TROPONINI 0.04*   ------------------------------------------------------------------------------------------------------------------  RADIOLOGY:  Dg Chest  Portable 1 View  08/20/2015   CLINICAL DATA:  Shortness of breath starting yesterday.  EXAM: PORTABLE CHEST - 1 VIEW  COMPARISON:  February 13, 2015  FINDINGS: The heart size and mediastinal contours are stable. The heart size is enlarged. There is mild interstitial pulmonary edema. There are small bilateral pleural effusions. There is no focal pneumonia appear The visualized skeletal structures are unremarkable.  IMPRESSION: Mild interstitial pulmonary edema with small bilateral pleural effusions.   Electronically Signed   By: Abelardo Diesel M.D.   On: 08/20/2015 11:29     IMPRESSION AND PLAN:   69 year old male with past medical history of hypertension, end-stage renal disease on hemodialysis, secondary hyperparathyroidism, CHF, DJD, who presents to the hospital due to shortness of breath and noted to be in acute respiratory failure with hypoxia.   #1 acute respiratory failure with hypoxia-this is likely secondary to CHF and volume overload. -Patient will get hemodialysis and will get extra fluid removed. -Continue O2 supplementation and we'll follow clinically.  #2 CHF-likely the cause of patient's shortness of breath and hypoxic respiratory failure. -I will go ahead and give the patient 1 dose of IV Lasix and patient will get hemodialysis today. -We'll follow clinically, wean O2 as tolerated.  #3 hypertension-continue Norvasc. Blood pressure is a bit uncontrolled but should improve after hemodialysis. -If needed will start on IV hydralazine as needed  #4 secondary hyperparathyroidism-continue Sensipar, Renvela.  #5 end-stage  renal disease on hemodialysis-patient normally gets dialysis on Monday Wednesday Friday. [facility] consult nephrology and patient to have an extra dialysis today.   All the records are reviewed and case discussed with ED provider. Management plans discussed with the patient, family and they are in agreement.  CODE STATUS: Full  TOTAL TIME TAKING CARE OF THIS  PATIENT: 45 minutes.    Henreitta Leber M.D on 08/20/2015 at 2:59 PM  Between 7am to 6pm - Pager - [phone]  After 6pm go to [email or web address] - password [email or web address] [facility]  Office  [phone]  CC: Primary care physician; Marden Noble, MD

## 2015-08-21 LAB — BASIC METABOLIC PANEL
Anion gap: 11 (ref 5–15)
BUN: 17 mg/dL (ref 6–20)
CHLORIDE: 98 mmol/L — AB (ref 101–111)
CO2: 32 mmol/L (ref 22–32)
Calcium: 9.4 mg/dL (ref 8.9–10.3)
Creatinine, Ser: 7.04 mg/dL — ABNORMAL HIGH (ref 0.61–1.24)
GFR calc non Af Amer: 7 mL/min — ABNORMAL LOW (ref >60)
GFR, EST AFRICAN AMERICAN: 8 mL/min — AB (ref >60)
Glucose, Bld: 99 mg/dL (ref 65–99)
POTASSIUM: 3.2 mmol/L — AB (ref 3.5–5.1)
SODIUM: 141 mmol/L (ref 135–145)

## 2015-08-21 LAB — CBC
HEMATOCRIT: 31.2 % — AB (ref 40.0–52.0)
HEMOGLOBIN: 10.5 g/dL — AB (ref 13.0–18.0)
MCH: 30.6 pg (ref 26.0–34.0)
MCHC: 33.6 g/dL (ref 32.0–36.0)
MCV: 91 fL (ref 80.0–100.0)
Platelets: 135 10*3/uL — ABNORMAL LOW (ref 150–440)
RBC: 3.42 MIL/uL — AB (ref 4.40–5.90)
RDW: 15.5 % — ABNORMAL HIGH (ref 11.5–14.5)
WBC: 3.9 10*3/uL (ref 3.8–10.6)

## 2015-08-21 MED ORDER — EPOETIN ALFA 10000 UNIT/ML IJ SOLN: 4000.0000 [IU] | INTRAMUSCULAR | Status: DC

## 2015-08-21 MED ORDER — CALCIUM CARBONATE ANTACID 500 MG PO CHEW
400.0000 mg | CHEWABLE_TABLET | Freq: Four times a day (QID) | ORAL | Status: DC | PRN
  Administered 2015-08-21: 400 mg via ORAL
  Filled 2015-08-21: qty 2

## 2015-08-21 MED ORDER — POLYETHYLENE GLYCOL 3350 17 G PO PACK
17.0000 g | PACK | Freq: Every day | ORAL | Status: DC
  Administered 2015-08-22: 17 g via ORAL
  Filled 2015-08-21: qty 1

## 2015-08-21 NOTE — Care Management (Signed)
Notified Dialysis Coordinator of admission.  It is reported patient had dialysis on Friday but session was cut short due to equipment malfunction. He presented to ED 8/28 for shortness of breath and admitted with heart failure.  His 02 is not chronic.  Says that if he has had fluid around his heart before- no one has told him. He does have CHF listed in history and physical in the past medical history.  Uses [facility] to get to dialysis and has a friend that provides transportation when patient has errands and grocery shopping.  He denies having problems accessing medical care or paying for medications.  Lives alone

## 2015-08-21 NOTE — Progress Notes (Signed)
Initial Nutrition Assessment   INTERVENTION:   Meals and Snacks: Cater to patient preferences Medical Food Supplement Therapy: will recommend on follow if intake poor Education: Discussed low sodium nutrition therapy with pt who reports not using salt on a regular basis. Will follow-up with more education on follow as pt asking for hot dog when RD left room and pt headed to HD.    NUTRITION DIAGNOSIS:   Inadequate oral intake related to poor appetite as evidenced by per patient/family report.  GOAL:   Patient will meet greater than or equal to 90% of their needs  MONITOR:    (Energy Intake, Electrolyte and Renal Profile, Anthropometrics)  REASON FOR ASSESSMENT:   Diagnosis    ASSESSMENT:   Pt admitted with SOB with h/o CHF and ESRD on HD. Per MD note, pt unable to finish HD session on Friday d/t malfunctioning equipment.  Past Medical History  Diagnosis Date  . End stage renal disease on dialysis   . Hypertension   . CHF (congestive heart failure)   . Hyperlipidemia   . DJD (degenerative joint disease)     Diet Order:  Diet renal with fluid restriction Fluid restriction:: 1200 mL Fluid; Room service appropriate?: Yes; Fluid consistency:: Thin    Current Nutrition: Pt reports eating 100% of hamburger for lunch. Recorded po intake 100% of meals since admission, pt reports eating toast and chicken for breakfast and noodles for dinner last night.   Food/Nutrition-Related History: Pt reports poor po intake for the past 2 weeks PTA feeling that he would vomit if he ate. Pt reports not drinking supplement PTA.    Medications: Sensipar, Miralax, Renvela  Electrolyte/Renal Profile and Glucose Profile:   Recent Labs Lab 08/20/15 1247 08/21/15 0459  NA 142 141  K 3.2* 3.2*  CL 99* 98*  CO2 30 32  BUN 27* 17  CREATININE 10.33* 7.04*  CALCIUM 10.1 9.4  GLUCOSE 89 99   Protein Profile:   Recent Labs Lab 08/20/15 1247  ALBUMIN 3.8    Gastrointestinal  Profile: Last BM:  08/19/2015   Nutrition-Focused Physical Exam Findings: Nutrition-Focused physical exam completed. Findings are WDL for fat depletion, muscle depletion, and edema.    Weight Change: Pt reports weight loss but does not know how much or his usual dry weight.    Skin:  Reviewed, no issues   Height:   Ht Readings from Last 1 Encounters:  08/20/15 5\' 8"  (1.727 m)    Weight:   Wt Readings from Last 1 Encounters:  08/20/15 205 lb 4 oz (93.1 kg)    Ideal Body Weight:  70 kg  BMI:  Body mass index is 31.22 kg/(m^2).  Estimated Nutritional Needs:   Kcal:  BEE: 1450kcals, TEE: (IF 1.2-1.4)(AF 1.2) [id number]  Protein:  84-105g protein (1.2-1.5g/kg)  Fluid:  UOP+1L  EDUCATION NEEDS:    Will review again on follow-up   [place], RD, LDN Pager [phone]

## 2015-08-21 NOTE — Progress Notes (Signed)
Spoke with dr. Vianne Bulls to make aware patient does not have an order for telemetry  And patients LBM was on 08/16/15, patient currently has nothing ordered. Per md she will place order for telemetry and assess patient. No new orders received [employer]

## 2015-08-21 NOTE — Care Management Note (Addendum)
Patient is active at [facility] on 1st shift.  Admission records have been sent and I will update with additional discharge records when that occurs.  Iran Sizer  Dialysis Liaison   [phone]

## 2015-08-21 NOTE — Progress Notes (Signed)
[facility] [facility] at [place] NAME: Brian Fleming    MR#:  [id number]  DATE OF BIRTH:  Dec 20, 1946  SUBJECTIVE: Admitted for shortness of breath and the acute hypoxia and O2 sats in mid 70s, uncontrolled hypertension. Patient received emergency hemodialysis yesterday. He feels better he says no shortness of breath no chest pain no cough. Last bowel movement was last Saturday.   CHIEF COMPLAINT:   Chief Complaint  Patient presents with  . Shortness of Breath    REVIEW OF SYSTEMS:    Review of Systems  Constitutional: Negative for fever and chills.  HENT: Negative for hearing loss.   Eyes: Negative for blurred vision, double vision and photophobia.  Respiratory: Negative for cough, hemoptysis and shortness of breath.   Cardiovascular: Negative for palpitations, orthopnea and leg swelling.  Gastrointestinal: Negative for vomiting, abdominal pain and diarrhea.  Genitourinary: Negative for dysuria and urgency.  Musculoskeletal: Negative for myalgias and neck pain.  Skin: Negative for rash.  Neurological: Negative for dizziness, focal weakness, seizures, weakness and headaches.  Psychiatric/Behavioral: Negative for memory loss. The patient does not have insomnia.     Nutrition:  Tolerating Diet: Tolerating PT:      DRUG ALLERGIES:  No Known Allergies  VITALS:  Blood pressure 175/90, pulse 80, temperature 97.9 F (36.6 C), temperature source Oral, resp. rate 19, height 5\' 8"  (1.727 m), weight 93.1 kg (205 lb 4 oz), SpO2 100 %.  PHYSICAL EXAMINATION:   Physical Exam  GENERAL:  69 y.o.-year-old patient lying in the bed with no acute distress.  EYES: Pupils equal, round, reactive to light and accommodation. No scleral icterus. Extraocular muscles intact.  HEENT: Head atraumatic, normocephalic. Oropharynx and nasopharynx clear.  NECK:  Supple, no jugular venous distention. No thyroid enlargement, no tenderness.  LUNGS: Normal breath  sounds bilaterally, no wheezing, rales,rhonchi or crepitation. No use of accessory muscles of respiration.  CARDIOVASCULAR: S1, S2 normal. No murmurs, rubs, or gallops.  ABDOMEN: Soft, nontender, nondistended. Bowel sounds present. No organomegaly or mass.  EXTREMITIES: No pedal edema, cyanosis, or clubbing.  NEUROLOGIC: Cranial nerves II through XII are intact. Muscle strength 5/5 in all extremities. Sensation intact. Gait not checked.  PSYCHIATRIC: The patient is alert and oriented x 3.  SKIN: No obvious rash, lesion, or ulcer.    LABORATORY PANEL:   CBC  Recent Labs Lab 08/21/15 0459  WBC 3.9  HGB 10.5*  HCT 31.2*  PLT 135*   ------------------------------------------------------------------------------------------------------------------  Chemistries   Recent Labs Lab 08/20/15 1247 08/21/15 0459  NA 142 141  K 3.2* 3.2*  CL 99* 98*  CO2 30 32  GLUCOSE 89 99  BUN 27* 17  CREATININE 10.33* 7.04*  CALCIUM 10.1 9.4  AST 14*  --   ALT 8*  --   ALKPHOS 88  --   BILITOT 0.8  --    ------------------------------------------------------------------------------------------------------------------  Cardiac Enzymes  Recent Labs Lab 08/20/15 1247  TROPONINI 0.04*   ------------------------------------------------------------------------------------------------------------------  RADIOLOGY:  Dg Chest Portable 1 View  08/20/2015   CLINICAL DATA:  Shortness of breath starting yesterday.  EXAM: PORTABLE CHEST - 1 VIEW  COMPARISON:  February 13, 2015  FINDINGS: The heart size and mediastinal contours are stable. The heart size is enlarged. There is mild interstitial pulmonary edema. There are small bilateral pleural effusions. There is no focal pneumonia appear The visualized skeletal structures are unremarkable.  IMPRESSION: Mild interstitial pulmonary edema with small bilateral pleural effusions.   Electronically Signed  By: Abelardo Diesel M.D.   On: 08/20/2015 11:29      ASSESSMENT AND PLAN:   Active Problems:   CHF (congestive heart failure)   1. Acute respiratory failure secondary to hypoxia: Secondary to inadequate hemodialysis. Had urgent HD last night,scheduled for his routine HD today,Hypoxia resolved.  #2 ESRD on hemodialysis Monday Wednesday Friday continue that. #3 uncontrolled hypertension and continue amlodipine and hydralazine for BP control and, monitored on telemetry. #4 constipation use of MiraLAX as needed patient last BM was Saturday.  Mild hypokalemia;K bath with HD today  All the records are reviewed and case discussed with Care Management/Social Workerr. Management plans discussed with the patient, family and they are in agreement.  CODE STATUS: full  TOTAL TIME TAKING CARE OF THIS PATIENT: 55 minutes.   POSSIBLE D/C IN 1-2 DAYS, DEPENDING ON CLINICAL CONDITION.   Epifanio Lesches M.D on 08/21/2015 at 11:19 AM  Between 7am to 6pm - Pager - [phone]  After 6pm go to [email or web address] - password [email or web address] [facility]  Office  [phone]  CC: Primary care physician; Marden Noble, MD

## 2015-08-21 NOTE — Progress Notes (Signed)
Subjective:  Had HD yesterday. Breathing comfortably this AM. Denies active shortness of breath. Due for HD again today per usual schedule.  Objective:  Vital signs in last 24 hours:  Temp:  [97.8 F (36.6 C)-98.7 F (37.1 C)] 97.9 F (36.6 C) (08/29 1111) Pulse Rate:  [77-95] 80 (08/29 1047) Resp:  [13-28] 19 (08/29 1047) BP: (156-214)/(81-100) 175/90 mmHg (08/29 1047) SpO2:  [91 %-100 %] 100 % (08/29 1047) Weight:  [93.1 kg (205 lb 4 oz)-96 kg (211 lb 10.3 oz)] 93.1 kg (205 lb 4 oz) (08/28 2156)  Weight change:  Filed Weights   08/20/15 1730 08/20/15 2123 08/20/15 2156  Weight: 96 kg (211 lb 10.3 oz) 93.2 kg (205 lb 7.5 oz) 93.1 kg (205 lb 4 oz)    Intake/Output:    Intake/Output Summary (Last 24 hours) at 08/21/15 1233 Last data filed at 08/21/15 0900  Gross per 24 hour  Intake    480 ml  Output   3000 ml  Net  -2520 ml     Physical Exam: General:  no acute distress, laying in the bed   HEENT  anicteric, moist mucous membranes   Neck  supple, no masses   Pulm/lungs  mild basilar rales normal effort  CVS/Heart  regular rhythm no rub or gallop   Abdomen:   soft nontender BS present  Extremities:  trace peripheral edema   Neurologic:  alert, oriented, speech normal   Skin:  no acute rashes   Access:  left forearm AV fistula        Basic Metabolic Panel:   Recent Labs Lab 08/20/15 1247 08/21/15 0459  NA 142 141  K 3.2* 3.2*  CL 99* 98*  CO2 30 32  GLUCOSE 89 99  BUN 27* 17  CREATININE 10.33* 7.04*  CALCIUM 10.1 9.4     CBC:  Recent Labs Lab 08/20/15 1247 08/21/15 0459  WBC 4.2 3.9  NEUTROABS 3.0  --   HGB 10.5* 10.5*  HCT 31.8* 31.2*  MCV 90.8 91.0  PLT 159 135*      Microbiology:  No results found for this or any previous visit (from the past 720 hour(s)).  Coagulation Studies: No results for input(s): LABPROT, INR in the last 72 hours.  Urinalysis: No results for input(s): COLORURINE, LABSPEC, PHURINE, GLUCOSEU, HGBUR,  BILIRUBINUR, KETONESUR, PROTEINUR, UROBILINOGEN, NITRITE, LEUKOCYTESUR in the last 72 hours.  Invalid input(s): APPERANCEUR    Imaging: Dg Chest Portable 1 View  08/20/2015   CLINICAL DATA:  Shortness of breath starting yesterday.  EXAM: PORTABLE CHEST - 1 VIEW  COMPARISON:  February 13, 2015  FINDINGS: The heart size and mediastinal contours are stable. The heart size is enlarged. There is mild interstitial pulmonary edema. There are small bilateral pleural effusions. There is no focal pneumonia appear The visualized skeletal structures are unremarkable.  IMPRESSION: Mild interstitial pulmonary edema with small bilateral pleural effusions.   Electronically Signed   By: Abelardo Diesel M.D.   On: 08/20/2015 11:29     Medications:     . amLODipine  10 mg Oral QHS  . cinacalcet  60 mg Oral QPC breakfast  . heparin  5,000 Units Subcutaneous 3 times per day  . polyethylene glycol  17 g Oral Daily  . sevelamer carbonate  2,400 mg Oral TID WC   acetaminophen **OR** acetaminophen, ondansetron **OR** ondansetron (ZOFRAN) IV, traMADol  Assessment/ Plan:  69 y.o. African-American male with end-stage renal disease, hypertension, hyperlipidemia, degenerative disc disease presents for acute shortness of  breath and is found to be hypoxic. He is admitted for urgent dialysis  1. End-stage renal disease. [employer]. Monday Wednesday Friday. [facility] physicians. Patient had HD yesterday, quite improved, will plan for HD again today per usual schedule.   2. Acute pulmonary edema Had urgent HD yesterday, tolerated well, will plan for additional HD as above today.  3. Anemia of chronic kidney disease. Hgb stable at 10.5, add epogen 4000 units IV with HD.   4. Hypertension BP still high today at 175/90, on amlodipine 10mg  po daily, reasses post HD again today.  5. Secondary hyperparathyroidism Continue renvela 2400mg  po TID/WM.   LOS: 1 Brian Fleming 8/29/201612:33 PM

## 2015-08-22 LAB — HEPATITIS B SURFACE ANTIGEN: HEP B S AG: NEGATIVE

## 2015-08-22 LAB — PLATELET COUNT: PLATELETS: 109 10*3/uL — AB (ref 150–440)

## 2015-08-22 MED ORDER — CALCIUM CARBONATE ANTACID 500 MG PO CHEW: 400.0000 mg | CHEWABLE_TABLET | Freq: Three times a day (TID) | ORAL | Status: DC

## 2015-08-22 MED ORDER — HYDRALAZINE HCL 25 MG PO TABS: 25.0000 mg | ORAL_TABLET | Freq: Three times a day (TID) | ORAL | Status: DC

## 2015-08-22 NOTE — Care Management Important Message (Signed)
Important Message  Patient Details  Name: Brian Fleming MRN: [id number] Date of Birth: 11/01/1946   Medicare Important Message Given:  Yes-second notification given    [name] 08/22/2015, 9:38 AM

## 2015-08-22 NOTE — Care Management (Signed)
Patient to discharge home today.  Will not require home 02.  Dialysis coordinator informed

## 2015-08-22 NOTE — Progress Notes (Signed)
Discharge instructions along with home medication list and follow up gone over with patient and girlfriend. Both verbalized that they understood instructions. Made patient aware prescriptions were electronically submitted. Iv and telemetry removed. Patient on RA. No distress noted. Patient to be discharged home. Will be taken off the floor via wheelchair by volunteer staff [employer]

## 2015-08-26 NOTE — Discharge Summary (Signed)
Brian Fleming, is a 69 y.o. male  DOB September 09, 1946  MRN [id number].  Admission date:  08/20/2015  Admitting Physician  Henreitta Leber, MD  Discharge Date:  08/22/2015   Primary MD  Marden Noble, MD  Recommendations for primary care physician for things to follow:   Follow-up primary doctor in 1 week   Admission Diagnosis  Acute pulmonary edema [J81.0] Hypoxia [R09.02]   Discharge Diagnosis  Acute pulmonary edema [J81.0] Hypoxia [R09.02]   Active Problems:   CHF (congestive heart failure)      Past Medical History  Diagnosis Date  . End stage renal disease on dialysis   . Hypertension   . CHF (congestive heart failure)   . Hyperlipidemia   . DJD (degenerative joint disease)     Past Surgical History  Procedure Laterality Date  . Av fistula placement    . Back surgery         History of present illness and  Hospital Course:     Kindly see H&P for history of present illness and admission details, please review complete Labs, Consult reports and Test reports for all details in brief  HPI  from the history and physical done on the day of admission  69 year old male with history of ESRD on hemodialysis, hypertension, hyperlipidemia came because of shortness of breath. Patient progressed on this Monday ,Wednesday ,Friday but the the Friday, August 26 he did not have further dialysis because machine was not functioning properly. Patient noted to have worsening shortness of breath on Saturday came to the emergency room and found to have hypoxia with O2 sat 70% on room air,Chest x-ray showing congestive heart failure. Patient appears also elevated systolic was [id number]. Admitted to hospitalist service secondary to acute hypoxic respiratory failure and malignant hypertension.    Hospital Course   #1 acute hypoxic  respiratory failure secondary to pulmonary edema in the contest of incomplete hemodialysis. Patient was seen by nephrology, had urgent hemodialysis on that today on August 28. And monitored on telemetry. Patient had regular hemodialysis again on Monday. Discharge home on now Tuesday, August 30. And the patient advised to rresume  his daily hemodialysis again on Wednesday. Oxygen saturations improved and he was 98% on room air when we discharged him.  2. Malignant hypertension improved with dialysis. Regime his home medications amlodipine, hydrallazine. #3 ESRD N hemanalysis Monday Wednesday Friday.    Discharge Condition:    Follow UP  Follow-up Information    Follow up with Marden Noble, MD. Go on 09/07/2015.   Specialty:  Internal Medicine   Why:  Time: 2:45 p.m.   Contact information:   [place] [address] [phone]       Follow up with [facility] for HD.   Why:  Please follow up at the dialysis center as scheduled        Discharge Instructions  and  Discharge Medications       Medication List    TAKE these medications        amLODipine 10 MG tablet  Commonly known as:  NORVASC  Take 10 mg by mouth at bedtime.     calcium carbonate 500 MG chewable tablet  Commonly known as:  TUMS - dosed in mg elemental calcium  Chew 2 tablets (400 mg of elemental calcium total) by mouth 3 (three) times daily.     cinacalcet 60 MG tablet  Commonly known as:  SENSIPAR  Take 60 mg by mouth  daily.     hydrALAZINE 25 MG tablet  Commonly known as:  APRESOLINE  Take 1 tablet (25 mg total) by mouth 3 (three) times daily.     sevelamer carbonate 800 MG tablet  Commonly known as:  RENVELA  Take 2,400 mg by mouth See admin instructions. Take 3 tablets (2400mg ) orally 3 times a day with meals and take 1 tablet orally with a snack.     traMADol 50 MG tablet  Commonly known as:  ULTRAM  Take by mouth every 8 (eight) hours as needed for moderate pain or severe pain.           Diet and Activity recommendation: See Discharge Instructions above   Consults obtained - nephrology   Major procedures and Radiology Reports - PLEASE review detailed and final reports for all details, in brief -      Dg Chest Portable 1 View  08/20/2015   CLINICAL DATA:  Shortness of breath starting yesterday.  EXAM: PORTABLE CHEST - 1 VIEW  COMPARISON:  February 13, 2015  FINDINGS: The heart size and mediastinal contours are stable. The heart size is enlarged. There is mild interstitial pulmonary edema. There are small bilateral pleural effusions. There is no focal pneumonia appear The visualized skeletal structures are unremarkable.  IMPRESSION: Mild interstitial pulmonary edema with small bilateral pleural effusions.   Electronically Signed   By: Abelardo Diesel M.D.   On: 08/20/2015 11:29    Micro Results     No results found for this or any previous visit (from the past 240 hour(s)).     Today   Subjective:   Brian Fleming today has no headache,no chest abdominal pain,no new weakness tingling or numbness, feels much better wants to go home today.   Objective:   Blood pressure 166/82, pulse 82, temperature 98.4 F (36.9 C), temperature source Oral, resp. rate 18, height 5\' 8"  (1.727 m), weight 90.3 kg (199 lb 1.2 oz), SpO2 99 %.  No intake or output data in the 24 hours ending 08/26/15 1210  Exam Awake Alert, Oriented x 3, No new F.N deficits, Normal affect [place].AT,PERRAL Supple Neck,No JVD, No cervical lymphadenopathy appriciated.  Symmetrical Chest wall movement, Good air movement bilaterally, CTAB RRR,No Gallops,Rubs or new Murmurs, No Parasternal Heave +ve B.Sounds, Abd Soft, Non tender, No organomegaly appriciated, No rebound -guarding or rigidity. No Cyanosis, Clubbing or edema, No new Rash or bruise  Data Review   CBC w Diff:  Lab Results  Component Value Date   WBC 3.9 08/21/2015   WBC 5.0 02/17/2012   HGB 10.5* 08/21/2015   HGB 12.4*  02/17/2012   HCT 31.2* 08/21/2015   HCT 37.4* 02/17/2012   PLT 109* 08/22/2015   PLT 71* 02/17/2012   LYMPHOPCT 17 08/20/2015   MONOPCT 6 08/20/2015   EOSPCT 3 08/20/2015   BASOPCT 1 08/20/2015    CMP:  Lab Results  Component Value Date   NA 141 08/21/2015   NA 139 02/17/2012   K 3.2* 08/21/2015   K 5.1 02/25/2012   CL 98* 08/21/2015   CL 98 02/17/2012   CO2 32 08/21/2015   CO2 22 02/17/2012   BUN 17 08/21/2015   BUN 40* 02/17/2012   CREATININE 7.04* 08/21/2015   CREATININE 9.36* 02/17/2012   PROT 7.3 08/20/2015   PROT 8.2 02/17/2012   ALBUMIN 3.8 08/20/2015   ALBUMIN 3.9 02/17/2012   BILITOT 0.8 08/20/2015   BILITOT 0.6 02/17/2012   ALKPHOS 88 08/20/2015   ALKPHOS 65 02/17/2012  AST 14* 08/20/2015   AST 31 02/17/2012   ALT 8* 08/20/2015   ALT 26 02/17/2012  .   Total Time in preparing paper work, data evaluation and todays exam - 37 minutes  Etha Stambaugh M.D on 08/22/2015 at 12:10 PM

## 2016-04-11 ENCOUNTER — Emergency Department: Payer: Medicare Other

## 2016-04-11 ENCOUNTER — Encounter: Payer: Self-pay | Admitting: Emergency Medicine

## 2016-04-11 ENCOUNTER — Observation Stay
Admission: EM | Admit: 2016-04-11 | Discharge: 2016-04-12 | Disposition: A | Discharge status: Home / Self Care | Payer: Medicare Other | Attending: Internal Medicine | Admitting: Internal Medicine

## 2016-04-11 DIAGNOSIS — R262 Difficulty in walking, not elsewhere classified: Secondary | ICD-10-CM | POA: Diagnosis not present

## 2016-04-11 DIAGNOSIS — N186 End stage renal disease: Secondary | ICD-10-CM | POA: Diagnosis not present

## 2016-04-11 DIAGNOSIS — Z79899 Other long term (current) drug therapy: Secondary | ICD-10-CM | POA: Diagnosis not present

## 2016-04-11 DIAGNOSIS — F17210 Nicotine dependence, cigarettes, uncomplicated: Secondary | ICD-10-CM | POA: Insufficient documentation

## 2016-04-11 DIAGNOSIS — I639 Cerebral infarction, unspecified: Secondary | ICD-10-CM

## 2016-04-11 DIAGNOSIS — J069 Acute upper respiratory infection, unspecified: Secondary | ICD-10-CM | POA: Diagnosis present

## 2016-04-11 DIAGNOSIS — R531 Weakness: Principal | ICD-10-CM

## 2016-04-11 DIAGNOSIS — Z9889 Other specified postprocedural states: Secondary | ICD-10-CM | POA: Diagnosis not present

## 2016-04-11 DIAGNOSIS — Z8249 Family history of ischemic heart disease and other diseases of the circulatory system: Secondary | ICD-10-CM | POA: Insufficient documentation

## 2016-04-11 DIAGNOSIS — R55 Syncope and collapse: Secondary | ICD-10-CM | POA: Diagnosis not present

## 2016-04-11 DIAGNOSIS — R471 Dysarthria and anarthria: Secondary | ICD-10-CM

## 2016-04-11 DIAGNOSIS — R4781 Slurred speech: Secondary | ICD-10-CM | POA: Diagnosis not present

## 2016-04-11 DIAGNOSIS — E785 Hyperlipidemia, unspecified: Secondary | ICD-10-CM | POA: Insufficient documentation

## 2016-04-11 DIAGNOSIS — W19XXXA Unspecified fall, initial encounter: Secondary | ICD-10-CM | POA: Insufficient documentation

## 2016-04-11 DIAGNOSIS — Z992 Dependence on renal dialysis: Secondary | ICD-10-CM | POA: Insufficient documentation

## 2016-04-11 DIAGNOSIS — I509 Heart failure, unspecified: Secondary | ICD-10-CM | POA: Insufficient documentation

## 2016-04-11 DIAGNOSIS — N2581 Secondary hyperparathyroidism of renal origin: Secondary | ICD-10-CM | POA: Insufficient documentation

## 2016-04-11 DIAGNOSIS — I120 Hypertensive chronic kidney disease with stage 5 chronic kidney disease or end stage renal disease: Secondary | ICD-10-CM | POA: Insufficient documentation

## 2016-04-11 DIAGNOSIS — I6523 Occlusion and stenosis of bilateral carotid arteries: Secondary | ICD-10-CM | POA: Insufficient documentation

## 2016-04-11 DIAGNOSIS — R739 Hyperglycemia, unspecified: Secondary | ICD-10-CM | POA: Insufficient documentation

## 2016-04-11 DIAGNOSIS — D631 Anemia in chronic kidney disease: Secondary | ICD-10-CM | POA: Diagnosis not present

## 2016-04-11 DIAGNOSIS — R569 Unspecified convulsions: Secondary | ICD-10-CM | POA: Insufficient documentation

## 2016-04-11 LAB — CBC WITH DIFFERENTIAL/PLATELET
BASOS ABS: 0 10*3/uL (ref 0–0.1)
Basophils Relative: 1 %
EOS PCT: 5 %
Eosinophils Absolute: 0.2 10*3/uL (ref 0–0.7)
HEMATOCRIT: 37.3 % — AB (ref 40.0–52.0)
Hemoglobin: 12.4 g/dL — ABNORMAL LOW (ref 13.0–18.0)
LYMPHS ABS: 1.1 10*3/uL (ref 1.0–3.6)
LYMPHS PCT: 29 %
MCH: 31.2 pg (ref 26.0–34.0)
MCHC: 33.1 g/dL (ref 32.0–36.0)
MCV: 94.2 fL (ref 80.0–100.0)
Monocytes Absolute: 0.6 10*3/uL (ref 0.2–1.0)
Monocytes Relative: 16 %
NEUTROS ABS: 2 10*3/uL (ref 1.4–6.5)
Neutrophils Relative %: 49 %
PLATELETS: 134 10*3/uL — AB (ref 150–440)
RBC: 3.96 MIL/uL — AB (ref 4.40–5.90)
RDW: 13.8 % (ref 11.5–14.5)
WBC: 4 10*3/uL (ref 3.8–10.6)

## 2016-04-11 LAB — COMPREHENSIVE METABOLIC PANEL
ALBUMIN: 3.5 g/dL (ref 3.5–5.0)
ALT: 15 U/L — AB (ref 17–63)
AST: 22 U/L (ref 15–41)
Alkaline Phosphatase: 91 U/L (ref 38–126)
Anion gap: 14 (ref 5–15)
BUN: 45 mg/dL — AB (ref 6–20)
CHLORIDE: 93 mmol/L — AB (ref 101–111)
CO2: 29 mmol/L (ref 22–32)
CREATININE: 9.82 mg/dL — AB (ref 0.61–1.24)
Calcium: 8.3 mg/dL — ABNORMAL LOW (ref 8.9–10.3)
GFR calc Af Amer: 6 mL/min — ABNORMAL LOW (ref >60)
GFR calc non Af Amer: 5 mL/min — ABNORMAL LOW (ref >60)
GLUCOSE: 87 mg/dL (ref 65–99)
Potassium: 4.4 mmol/L (ref 3.5–5.1)
SODIUM: 136 mmol/L (ref 135–145)
Total Bilirubin: 0.6 mg/dL (ref 0.3–1.2)
Total Protein: 7.5 g/dL (ref 6.5–8.1)

## 2016-04-11 LAB — LIPID PANEL
CHOL/HDL RATIO: 6 ratio
Cholesterol: 157 mg/dL (ref 0–200)
HDL: 26 mg/dL — AB (ref >40)
LDL CALC: 110 mg/dL — AB (ref 0–99)
Triglycerides: 105 mg/dL (ref <150)
VLDL: 21 mg/dL (ref 0–40)

## 2016-04-11 LAB — TSH: TSH: 1.04 u[IU]/mL (ref 0.350–4.500)

## 2016-04-11 LAB — TROPONIN I: TROPONIN I: 0.04 ng/mL — AB (ref <0.031)

## 2016-04-11 MED ORDER — CALCIUM CARBONATE ANTACID 500 MG PO CHEW
400.0000 mg | CHEWABLE_TABLET | Freq: Three times a day (TID) | ORAL | Status: DC
  Administered 2016-04-11 – 2016-04-12 (×2): 400 mg via ORAL
  Filled 2016-04-11 (×2): qty 2

## 2016-04-11 MED ORDER — HYDRALAZINE HCL 25 MG PO TABS
25.0000 mg | ORAL_TABLET | Freq: Three times a day (TID) | ORAL | Status: DC
  Administered 2016-04-11 – 2016-04-12 (×2): 25 mg via ORAL
  Filled 2016-04-11 (×2): qty 1

## 2016-04-11 MED ORDER — MORPHINE SULFATE (PF) 2 MG/ML IV SOLN
2.0000 mg | Freq: Once | INTRAVENOUS | Status: AC
  Administered 2016-04-11: 2 mg via INTRAVENOUS

## 2016-04-11 MED ORDER — AMLODIPINE BESYLATE 10 MG PO TABS
10.0000 mg | ORAL_TABLET | Freq: Every day | ORAL | Status: DC
  Administered 2016-04-11: 10 mg via ORAL
  Filled 2016-04-11: qty 1

## 2016-04-11 MED ORDER — TRAMADOL HCL 50 MG PO TABS
100.0000 mg | ORAL_TABLET | Freq: Four times a day (QID) | ORAL | Status: DC
  Administered 2016-04-11 – 2016-04-12 (×3): 100 mg via ORAL
  Filled 2016-04-11 (×3): qty 2

## 2016-04-11 MED ORDER — HYDROXYZINE HCL 10 MG PO TABS
10.0000 mg | ORAL_TABLET | Freq: Every day | ORAL | Status: DC
  Administered 2016-04-12: 10 mg via ORAL
  Filled 2016-04-11: qty 1

## 2016-04-11 MED ORDER — HEPARIN SODIUM (PORCINE) 5000 UNIT/ML IJ SOLN
5000.0000 [IU] | Freq: Three times a day (TID) | INTRAMUSCULAR | Status: DC
  Administered 2016-04-11 – 2016-04-12 (×3): 5000 [IU] via SUBCUTANEOUS
  Filled 2016-04-11 (×3): qty 1

## 2016-04-11 MED ORDER — MORPHINE SULFATE (PF) 2 MG/ML IV SOLN
INTRAVENOUS | Status: AC
  Administered 2016-04-11: 2 mg via INTRAVENOUS
  Filled 2016-04-11: qty 1

## 2016-04-11 MED ORDER — GABAPENTIN 100 MG PO CAPS
100.0000 mg | ORAL_CAPSULE | Freq: Four times a day (QID) | ORAL | Status: DC
  Administered 2016-04-11 – 2016-04-12 (×3): 100 mg via ORAL
  Filled 2016-04-11 (×3): qty 1

## 2016-04-11 NOTE — ED Notes (Signed)
Pt arrived via from home. EMS got called out for stroke like symptoms by his caregiver. EMS did a stroke screen which was negative. Per caregiver and pt he was unable to walk this morning; pt reports that his legs were too weak to walk this morning. Caregiver reported to EMS that he had an episode of shaking and slurred speech 2 weeks ago. Unclear whether or not pt was seen for that. Vital signs WNL for EMS: BP 148/83, pulse 81, 100% on RA, FSBS 116. Pt has hx of diabetes and is dialysis pt with fistula in left arm, last dialysis treatment was yesterday.

## 2016-04-11 NOTE — H&P (Signed)
[facility] NAME: Brian Fleming    MR#:  [id number]  DATE OF BIRTH:  1946/12/22  DATE OF ADMISSION:  04/11/2016  PRIMARY CARE PHYSICIAN: Marden Noble, MD   REQUESTING/REFERRING PHYSICIAN: Clearnce Hasten  CHIEF COMPLAINT:   Chief Complaint  Patient presents with  . Weakness    HISTORY OF PRESENT ILLNESS: Brian Fleming  is a 70 y.o. male with a known history of ESRD on HD, Htn, CHF, HLP, Seizures for last 2 weeks- Started on some medication by primary care physician. Since then he is feeling overall weak. Today morning he woke up and wanted to get out of the bed he could barely do it and then he fell down while trying to walk his legs were feeling very weak, so he called EMS and came to emergency room. He denies any cough or fever, but he said that he had some chills. He does not produce any urine as he is on dialysis for almost 20 years now.  PAST MEDICAL HISTORY:   Past Medical History  Diagnosis Date  . End stage renal disease on dialysis ([place])   . Hypertension   . CHF (congestive heart failure) ([place])   . Hyperlipidemia   . DJD (degenerative joint disease)   . Seizures ([place])     PAST SURGICAL HISTORY: Past Surgical History  Procedure Laterality Date  . Av fistula placement    . Back surgery      SOCIAL HISTORY:  Social History  Substance Use Topics  . Smoking status: Current Every Day Smoker -- 0.25 packs/day    Types: Cigarettes  . Smokeless tobacco: Not on file  . Alcohol Use: No    FAMILY HISTORY:  Family History  Problem Relation Age of Onset  . Hypertension Brother     DRUG ALLERGIES: No Known Allergies  REVIEW OF SYSTEMS:   CONSTITUTIONAL: No fever, Positive for fatigue and generalized weakness.  EYES: No blurred or double vision.  EARS, NOSE, AND THROAT: No tinnitus or ear pain.  RESPIRATORY: No cough, shortness of breath, wheezing or hemoptysis.  CARDIOVASCULAR: No chest pain, orthopnea,  edema.  GASTROINTESTINAL: No nausea, vomiting, diarrhea or abdominal pain.  GENITOURINARY: No dysuria, hematuria.  ENDOCRINE: No polyuria, nocturia,  HEMATOLOGY: No anemia, easy bruising or bleeding SKIN: No rash or lesion. MUSCULOSKELETAL: No joint pain or arthritis.   NEUROLOGIC: No tingling, numbness, weakness.  PSYCHIATRY: No anxiety or depression.   MEDICATIONS AT HOME:  Prior to Admission medications   Medication Sig Start Date End Date Taking? Authorizing Provider  amLODipine (NORVASC) 10 MG tablet Take 10 mg by mouth at bedtime. 04/29/14  Yes Historical Provider, MD  calcium carbonate (TUMS - DOSED IN MG ELEMENTAL CALCIUM) 500 MG chewable tablet Chew 2 tablets (400 mg of elemental calcium total) by mouth 3 (three) times daily. 08/22/15  Yes Epifanio Lesches, MD  gabapentin (NEURONTIN) 100 MG capsule Take 100 mg by mouth 4 (four) times daily.   Yes Historical Provider, MD  hydrALAZINE (APRESOLINE) 25 MG tablet Take 1 tablet (25 mg total) by mouth 3 (three) times daily. 08/22/15  Yes Epifanio Lesches, MD  hydrOXYzine (ATARAX/VISTARIL) 10 MG tablet Take 10 mg by mouth daily.   Yes Historical Provider, MD  traMADol (ULTRAM) 50 MG tablet Take 100 mg by mouth 4 (four) times daily.    Yes Historical Provider, MD      PHYSICAL EXAMINATION:   VITAL SIGNS: Blood pressure 185/99, pulse 77, temperature 98.3  F (36.8 C), temperature source Oral, resp. rate 20, height 5\' 8"  (1.727 m), weight 93.441 kg (206 lb), SpO2 97 %.  GENERAL:  70 y.o.-year-old patient lying in the bed with no acute distress.  EYES: Pupils equal, round, reactive to light and accommodation. No scleral icterus. Extraocular muscles intact.  HEENT: Head atraumatic, normocephalic. Oropharynx and nasopharynx clear.  NECK:  Supple, no jugular venous distention. No thyroid enlargement, no tenderness.  LUNGS: Normal breath sounds bilaterally, no wheezing, rales,rhonchi or crepitation. No use of accessory muscles of  respiration.  CARDIOVASCULAR: S1, S2 normal. No murmurs, rubs, or gallops.  ABDOMEN: Soft, nontender, nondistended. Bowel sounds present. No organomegaly or mass.  EXTREMITIES: No pedal edema, cyanosis, or clubbing.  NEUROLOGIC: Cranial nerves II through XII are intact. Muscle strength 4/5 in Upper extremities and 3-4 out of 5 in lower extremity. Sensation intact. Gait not checked.  PSYCHIATRIC: The patient is alert and oriented x 3.  SKIN: No obvious rash, lesion, or ulcer.   LABORATORY PANEL:   CBC  Recent Labs Lab 04/11/16 1553  WBC 4.0  HGB 12.4*  HCT 37.3*  PLT 134*  MCV 94.2  MCH 31.2  MCHC 33.1  RDW 13.8  LYMPHSABS 1.1  MONOABS 0.6  EOSABS 0.2  BASOSABS 0.0   ------------------------------------------------------------------------------------------------------------------  Chemistries   Recent Labs Lab 04/11/16 1553  NA 136  K 4.4  CL 93*  CO2 29  GLUCOSE 87  BUN 45*  CREATININE 9.82*  CALCIUM 8.3*  AST 22  ALT 15*  ALKPHOS 91  BILITOT 0.6   ------------------------------------------------------------------------------------------------------------------ estimated creatinine clearance is 8.1 mL/min (by C-G formula based on Cr of 9.82). ------------------------------------------------------------------------------------------------------------------  Recent Labs  04/11/16 1553  TSH 1.040     Coagulation profile No results for input(s): INR, PROTIME in the last 168 hours. ------------------------------------------------------------------------------------------------------------------- No results for input(s): DDIMER in the last 72 hours. -------------------------------------------------------------------------------------------------------------------  Cardiac Enzymes  Recent Labs Lab 04/11/16 1553  TROPONINI 0.04*   ------------------------------------------------------------------------------------------------------------------ Invalid  input(s): POCBNP  ---------------------------------------------------------------------------------------------------------------  Urinalysis No results found for: COLORURINE, APPEARANCEUR, LABSPEC, PHURINE, GLUCOSEU, HGBUR, BILIRUBINUR, KETONESUR, PROTEINUR, UROBILINOGEN, NITRITE, LEUKOCYTESUR   RADIOLOGY: Dg Chest 2 View  04/11/2016  CLINICAL DATA:  Shaking and slurred speech 2 weeks ago. The patient became unable to walk today. Initial encounter. EXAM: CHEST  2 VIEW COMPARISON:  PA and lateral chest 02/13/2015. FINDINGS: There is cardiomegaly without edema. The lungs are clear. No pneumothorax or pleural effusion. No focal bony abnormality. IMPRESSION: Cardiomegaly without acute disease. Electronically Signed   By: Inge Rise M.D.   On: 04/11/2016 16:46   Ct Head Wo Contrast  04/11/2016  CLINICAL DATA:  Weakness, stroke symptoms, difficulty ambulating, slurred speech EXAM: CT HEAD WITHOUT CONTRAST TECHNIQUE: Contiguous axial images were obtained from the base of the skull through the vertex without contrast. COMPARISON:  None available FINDINGS: Limited with some motion artifact. Diffuse brain atrophy evident with minor chronic white matter microvascular ischemic changes about the ventricles. No acute intracranial hemorrhage, mass lesion, definite infarction, midline shift, herniation, or extra-axial fluid collection. No focal mass effect or edema. Normal gray-white matter differentiation. Cisterns are patent. Cerebellar atrophy as well. Atherosclerosis of the intracranial vessels. No skull abnormality. Mastoids and sinuses remain clear. IMPRESSION: Brain atrophy and chronic white matter microvascular changes. No acute process by noncontrast imaging. Electronically Signed   By: Jerilynn Mages.  Shick M.D.   On: 04/11/2016 16:36    EKG: Orders placed or performed during the hospital encounter of 08/20/15  . EKG 12-Lead  .  EKG 12-Lead  . EKG    IMPRESSION AND PLAN:  * Generalized weakness   CT of  the head is negative, chest x-ray is clear.   Patient does not have any fever or elevated white cell count.   I'm not very sure about the reason for his generalized weakness.   Possibly it may be a stroke.   Patient is also seen for last 2 weeks he is having some partial seizures frequently, and he is started on medication by his primary care physician for that.   I will get an MRI of the brain and get neurology consult for further help.   He will need physical therapy evaluation.  * End-stage renal disease on hemodialysis   : Nephrology consult for his routine hemodialysis.  * Hypertension   Continue home medications.  * Smoking  Counseled to quit smoking for 4 minutes.     All the records are reviewed and case discussed with ED provider. Management plans discussed with the patient, family and they are in agreement.  CODE STATUS:. Code  Code Status History    Date Active Date Inactive Code Status Order ID Comments User Context   08/20/2015  3:53 PM 08/22/2015  4:21 PM Full Code [id number]  Henreitta Leber, MD Inpatient       TOTAL TIME TAKING CARE OF THIS PATIENT: 50  minutes.    Vaughan Basta M.D on 04/11/2016   Between 7am to 6pm - Pager - [phone]  After 6pm go to [email or web address] - password [email or web address] [facility]  Office  [phone]  CC: Primary care physician; Marden Noble, MD   Note: This dictation was prepared with Dragon dictation along with smaller phrase technology. Any transcriptional errors that result from this process are unintentional.

## 2016-04-11 NOTE — ED Provider Notes (Signed)
[facility] Emergency Department Provider Note  ____________________________________________  Time seen: Seen upon arrival to the emergency department  I have reviewed the triage vital signs and the nursing notes.   HISTORY  Chief Complaint Weakness   HPI Brian Fleming is a 70 y.o. male with a history of end-stage renal disease on dialysis as well as CHF who is presenting to the emergency department today with diffuse weakness and inability to walk that started this morning. EMS reports that the patient's caregiver called them today because the patient awoke this morning and was unable to walk. The patient says that he feels weak in his legs but also all over his body. Apparently there is also an episode about a week and half ago with questionable seizure activity. None of this was noted today. However, ever since his episode the patient has had an abnormality to his speech which has worsened over the past week. Per EMS she was able to walk last night but woke up this morning at about 9 AM with this weakness and inability tablet. The patient is denying any pain. Says he does not make any urine. Says that his last dialysis session was yesterday and had a full session. Says that he has had a cough and runny nose lately. Denies any fever. Denies any nausea or vomiting or abdominal pain.   Past Medical History  Diagnosis Date  . End stage renal disease on dialysis ([place])   . Hypertension   . CHF (congestive heart failure) ([place])   . Hyperlipidemia   . DJD (degenerative joint disease)     Patient Active Problem List   Diagnosis Date Noted  . CHF (congestive heart failure) ([place]) 08/20/2015    Past Surgical History  Procedure Laterality Date  . Av fistula placement    . Back surgery      Current Outpatient Rx  Name  Route  Sig  Dispense  Refill  . amLODipine (NORVASC) 10 MG tablet   Oral   Take 10 mg by mouth at bedtime.         . calcium carbonate  (TUMS - DOSED IN MG ELEMENTAL CALCIUM) 500 MG chewable tablet   Oral   Chew 2 tablets (400 mg of elemental calcium total) by mouth 3 (three) times daily.   40 tablet   0   . gabapentin (NEURONTIN) 100 MG capsule   Oral   Take 100 mg by mouth 4 (four) times daily.         . hydrALAZINE (APRESOLINE) 25 MG tablet   Oral   Take 1 tablet (25 mg total) by mouth 3 (three) times daily.   60 tablet   0   . hydrOXYzine (ATARAX/VISTARIL) 10 MG tablet   Oral   Take 10 mg by mouth daily.         . traMADol (ULTRAM) 50 MG tablet   Oral   Take 100 mg by mouth 4 (four) times daily.          . cinacalcet (SENSIPAR) 60 MG tablet   Oral   Take 60 mg by mouth daily.         . sevelamer carbonate (RENVELA) 800 MG tablet   Oral   Take 2,400 mg by mouth See admin instructions. Take 3 tablets (2400mg ) orally 3 times a day with meals and take 1 tablet orally with a snack.           Allergies Review of patient's allergies indicates no known allergies.  Family History  Problem Relation Age of Onset  . Family history unknown: Yes    Social History Social History  Substance Use Topics  . Smoking status: Current Every Day Smoker -- 0.25 packs/day    Types: Cigarettes  . Smokeless tobacco: None  . Alcohol Use: No    Review of Systems Constitutional: No fever/chills Eyes: No visual changes. ENT: No sore throat. Cardiovascular: Denies chest pain. Respiratory: Denies shortness of breath. Gastrointestinal: No abdominal pain.  No nausea, no vomiting.  No diarrhea.  No constipation. Genitourinary: Negative for dysuria. Musculoskeletal: Negative for back pain. Skin: Negative for rash. Neurological: Negative for headaches, focal weakness or numbness.  10-point ROS otherwise negative.  ____________________________________________   PHYSICAL EXAM:  VITAL SIGNS: ED Triage Vitals  Enc Vitals Group     BP 04/11/16 1544 146/94 mmHg     Pulse Rate 04/11/16 1544 79     Resp  04/11/16 1544 19     Temp 04/11/16 1544 98.3 F (36.8 C)     Temp Source 04/11/16 1544 Oral     SpO2 04/11/16 1544 96 %     Weight 04/11/16 1544 206 lb (93.441 kg)     Height 04/11/16 1544 5\' 8"  (1.727 m)     Head Cir --      Peak Flow --      Pain Score 04/11/16 1546 0     Pain Loc --      Pain Edu? --      Excl. in [place]? --     Constitutional: Alert and oriented. Well appearing and in no acute distress. Eyes: Conjunctivae are normal. PERRL. EOMI. Head: Atraumatic. Nose: No congestion/rhinnorhea. Mouth/Throat: Mucous membranes are moist.  Nonerythematous oropharynx. Neck: No stridor.   Cardiovascular: Normal rate, regular rhythm. Grossly normal heart sounds.  Good peripheral circulation. Palpable thrill to left upper extremity dialysis access. Respiratory: Normal respiratory effort.  No retractions. Lungs CTAB. Gastrointestinal: Soft and nontender. No distention. No abdominal bruits. No CVA tenderness. Musculoskeletal: No lower extremity tenderness nor edema.  No joint effusions. Neurologic:  Normal speech and language. No focal neurologic deficits are appreciated however is a 4 out of 5 strength throughout. No facial asymmetry. Skin:  Skin is warm, dry and intact. No rash noted. Psychiatric: Mood and affect are normal. Speech and behavior are normal.  ____________________________________________   LABS (all labs ordered are listed, but only abnormal results are displayed)  Labs Reviewed  CBC WITH DIFFERENTIAL/PLATELET - Abnormal; Notable for the following:    RBC 3.96 (*)    Hemoglobin 12.4 (*)    HCT 37.3 (*)    Platelets 134 (*)    All other components within normal limits  COMPREHENSIVE METABOLIC PANEL - Abnormal; Notable for the following:    Chloride 93 (*)    BUN 45 (*)    Creatinine, Ser 9.82 (*)    Calcium 8.3 (*)    ALT 15 (*)    GFR calc non Af Amer 5 (*)    GFR calc Af Amer 6 (*)    All other components within normal limits  TROPONIN I - Abnormal; Notable  for the following:    Troponin I 0.04 (*)    All other components within normal limits  TSH   ____________________________________________  EKG  ED ECG REPORT I, Doran Stabler, the attending physician, personally viewed and interpreted this ECG.   Date: 04/11/2016  EKG Time: 1537  Rate: 80  Rhythm: Normal sinus rhythm  Axis: Normal  axis  Intervals:Prolonged QT interval.  ST&T Change: No ST segment elevation or depression. T-wave inversions in 2, 3 and aVF as well as biphasic T waves in V5 and V6. Similar morphology to multiple EKGs on the record.  ____________________________________________  RADIOLOGY      DG Chest 2 View (Final result) Result time: 04/11/16 16:46:44   Final result by Rad Results In Interface (04/11/16 16:46:44)   Narrative:   CLINICAL DATA: Shaking and slurred speech 2 weeks ago. The patient became unable to walk today. Initial encounter.  EXAM: CHEST 2 VIEW  COMPARISON: PA and lateral chest 02/13/2015.  FINDINGS: There is cardiomegaly without edema. The lungs are clear. No pneumothorax or pleural effusion. No focal bony abnormality.  IMPRESSION: Cardiomegaly without acute disease.   Electronically Signed By: Inge Rise M.D. On: 04/11/2016 16:46          CT Head Wo Contrast (Final result) Result time: 04/11/16 16:36:45   Final result by Rad Results In Interface (04/11/16 16:36:45)   Narrative:   CLINICAL DATA: Weakness, stroke symptoms, difficulty ambulating, slurred speech  EXAM: CT HEAD WITHOUT CONTRAST  TECHNIQUE: Contiguous axial images were obtained from the base of the skull through the vertex without contrast.  COMPARISON: None available  FINDINGS: Limited with some motion artifact. Diffuse brain atrophy evident with minor chronic white matter microvascular ischemic changes about the ventricles. No acute intracranial hemorrhage, mass lesion, definite infarction, midline shift, herniation, or  extra-axial fluid collection. No focal mass effect or edema. Normal gray-white matter differentiation. Cisterns are patent. Cerebellar atrophy as well. Atherosclerosis of the intracranial vessels. No skull abnormality. Mastoids and sinuses remain clear.  IMPRESSION: Brain atrophy and chronic white matter microvascular changes.  No acute process by noncontrast imaging.   Electronically Signed By: Jerilynn Mages. Shick M.D. On: 04/11/2016 16:36       ____________________________________________   PROCEDURES   ____________________________________________   INITIAL IMPRESSION / ASSESSMENT AND PLAN / ED COURSE  Pertinent labs & imaging results that were available during my care of the patient were reviewed by me and considered in my medical decision making (see chart for details).  ----------------------------------------- 6:32 PM on 04/11/2016 -----------------------------------------  Patient resting comfortably without any acute distress. His nurses now the bedside and says the patient only has nursing support for half the day and is alone by himself at night. She says that the family is not in the picture and she does not even have the contact information for his children. She says the patient has only been a little walk a few steps and then has become wobbly and seems like he will fall. She is also saying the patient has had a cough with runny nose all week and thinks that the patient may be getting weaker as the week is going on. The nurses also saying that she thinks the patient's speech is slurred starting this morning. I suspect the patient is deconditioned but it seems that the patient is not safe for discharge to home because of lack of support. We will admit to the hospital. Signed out to Dr. Anselm Jungling. Initially this plan and the patient as well as his nurses the bedside and they're understanding and willing to comply. ____________________________________________   FINAL  CLINICAL IMPRESSION(S) / ED DIAGNOSES  Weakness. Dysarthria. URI.    Orbie Pyo, MD 04/11/16 [phone]

## 2016-04-12 ENCOUNTER — Observation Stay: Payer: Medicare Other

## 2016-04-12 ENCOUNTER — Observation Stay: Admit: 2016-04-12 | Payer: Medicare Other

## 2016-04-12 DIAGNOSIS — R531 Weakness: Secondary | ICD-10-CM | POA: Diagnosis not present

## 2016-04-12 LAB — BASIC METABOLIC PANEL
ANION GAP: 12 (ref 5–15)
BUN: 51 mg/dL — ABNORMAL HIGH (ref 6–20)
CALCIUM: 7.8 mg/dL — AB (ref 8.9–10.3)
CHLORIDE: 95 mmol/L — AB (ref 101–111)
CO2: 30 mmol/L (ref 22–32)
Creatinine, Ser: 10.71 mg/dL — ABNORMAL HIGH (ref 0.61–1.24)
GFR calc non Af Amer: 4 mL/min — ABNORMAL LOW (ref >60)
GFR, EST AFRICAN AMERICAN: 5 mL/min — AB (ref >60)
GLUCOSE: 87 mg/dL (ref 65–99)
POTASSIUM: 4.1 mmol/L (ref 3.5–5.1)
Sodium: 137 mmol/L (ref 135–145)

## 2016-04-12 LAB — CBC
HEMATOCRIT: 34.6 % — AB (ref 40.0–52.0)
HEMOGLOBIN: 11.4 g/dL — AB (ref 13.0–18.0)
MCH: 30.7 pg (ref 26.0–34.0)
MCHC: 33 g/dL (ref 32.0–36.0)
MCV: 93.1 fL (ref 80.0–100.0)
Platelets: 133 10*3/uL — ABNORMAL LOW (ref 150–440)
RBC: 3.72 MIL/uL — AB (ref 4.40–5.90)
RDW: 14 % (ref 11.5–14.5)
WBC: 3.5 10*3/uL — ABNORMAL LOW (ref 3.8–10.6)

## 2016-04-12 LAB — MRSA PCR SCREENING: MRSA BY PCR: NEGATIVE

## 2016-04-12 MED ORDER — ATORVASTATIN CALCIUM 20 MG PO TABS: 40.0000 mg | ORAL_TABLET | Freq: Every day | ORAL | Status: DC

## 2016-04-12 MED ORDER — ASPIRIN 81 MG PO CHEW
81.0000 mg | CHEWABLE_TABLET | Freq: Every day | ORAL | Status: DC
  Administered 2016-04-12: 81 mg via ORAL
  Filled 2016-04-12: qty 1

## 2016-04-12 MED ORDER — ASPIRIN EC 81 MG PO TBEC: 81.0000 mg | DELAYED_RELEASE_TABLET | Freq: Every day | ORAL | Status: DC

## 2016-04-12 MED ORDER — SEVELAMER CARBONATE 800 MG PO TABS
2400.0000 mg | ORAL_TABLET | Freq: Three times a day (TID) | ORAL | Status: DC
  Filled 2016-04-12 (×2): qty 3

## 2016-04-12 MED ORDER — ATORVASTATIN CALCIUM 40 MG PO TABS: 40.0000 mg | ORAL_TABLET | Freq: Every day | ORAL | Status: DC

## 2016-04-12 NOTE — Care Management (Signed)
Admitted to [facility] under observation status. Ex Wife is Lisabeth Pick [phone]). Established dialysis patient at [facility] on [address], Monday-Wednesday and Friday. Uses [facility] for transportation. Dr. Brynda Greathouse is listed as primary care physician., Caregiver in the home. Gone to dialysis. Physical therapy evaluation completed. No follow-up needs.  Shelbie Ammons RN MSN CCM Care Management [phone]

## 2016-04-12 NOTE — Progress Notes (Signed)
[facility]  ROUNDING NOTE   Subjective:   Admitted for Weakness [R53.1] URI (upper respiratory infection) [J06.9] Dysarthria [R47.1]   Patient complains of presyncope.   Seen and examined on hemodialysis. Tolerating treatment well.   Objective:  Vital signs in last 24 hours:  Temp:  [97.5 F (36.4 C)-98.3 F (36.8 C)] 97.7 F (36.5 C) (04/21 1115) Pulse Rate:  [75-83] 80 (04/21 1330) Resp:  [12-21] 16 (04/21 1115) BP: (130-189)/(64-104) 162/100 mmHg (04/21 1115) SpO2:  [92 %-100 %] 99 % (04/21 1115) Weight:  [93.441 kg (206 lb)-96.616 kg (213 lb)] 96.616 kg (213 lb) (04/21 0430)  Weight change:  Filed Weights   04/11/16 1544 04/12/16 0430  Weight: 93.441 kg (206 lb) 96.616 kg (213 lb)    Intake/Output:     Intake/Output this shift:  Total I/O In: 240 [P.O.:240] Out: -   Physical Exam: General: NAD  Head: Normocephalic, atraumatic. Moist oral mucosal membranes  Eyes: Anicteric, PERRL  Neck: Supple, trachea midline  Lungs:  Clear to auscultation  Heart: Regular rate and rhythm  Abdomen:  Soft, nontender,   Extremities: no peripheral edema.  Neurologic: Nonfocal, moving all four extremities  Skin: No lesions  Access: Left radiocephalic AVF    Basic Metabolic Panel:  Recent Labs Lab 04/11/16 1553 04/12/16 0525  NA 136 137  K 4.4 4.1  CL 93* 95*  CO2 29 30  GLUCOSE 87 87  BUN 45* 51*  CREATININE 9.82* 10.71*  CALCIUM 8.3* 7.8*    Liver Function Tests:  Recent Labs Lab 04/11/16 1553  AST 22  ALT 15*  ALKPHOS 91  BILITOT 0.6  PROT 7.5  ALBUMIN 3.5   No results for input(s): LIPASE, AMYLASE in the last 168 hours. No results for input(s): AMMONIA in the last 168 hours.  CBC:  Recent Labs Lab 04/11/16 1553 04/12/16 0525  WBC 4.0 3.5*  NEUTROABS 2.0  --   HGB 12.4* 11.4*  HCT 37.3* 34.6*  MCV 94.2 93.1  PLT 134* 133*    Cardiac Enzymes:  Recent Labs Lab 04/11/16 1553  TROPONINI 0.04*    BNP: Invalid  input(s): POCBNP  CBG: No results for input(s): GLUCAP in the last 168 hours.  Microbiology: Results for orders placed or performed during the hospital encounter of 04/11/16  MRSA PCR Screening     Status: None   Collection Time: 04/12/16  5:32 AM  Result Value Ref Range Status   MRSA by PCR NEGATIVE NEGATIVE Final    Comment:        The GeneXpert MRSA Assay (FDA approved for NASAL specimens only), is one component of a comprehensive MRSA colonization surveillance program. It is not intended to diagnose MRSA infection nor to guide or monitor treatment for MRSA infections.     Coagulation Studies: No results for input(s): LABPROT, INR in the last 72 hours.  Urinalysis: No results for input(s): COLORURINE, LABSPEC, PHURINE, GLUCOSEU, HGBUR, BILIRUBINUR, KETONESUR, PROTEINUR, UROBILINOGEN, NITRITE, LEUKOCYTESUR in the last 72 hours.  Invalid input(s): APPERANCEUR    Imaging: Dg Chest 2 View  04/11/2016  CLINICAL DATA:  Shaking and slurred speech 2 weeks ago. The patient became unable to walk today. Initial encounter. EXAM: CHEST  2 VIEW COMPARISON:  PA and lateral chest 02/13/2015. FINDINGS: There is cardiomegaly without edema. The lungs are clear. No pneumothorax or pleural effusion. No focal bony abnormality. IMPRESSION: Cardiomegaly without acute disease. Electronically Signed   By: Inge Rise M.D.   On: 04/11/2016 16:46   Ct Head  Wo Contrast  04/11/2016  CLINICAL DATA:  Weakness, stroke symptoms, difficulty ambulating, slurred speech EXAM: CT HEAD WITHOUT CONTRAST TECHNIQUE: Contiguous axial images were obtained from the base of the skull through the vertex without contrast. COMPARISON:  None available FINDINGS: Limited with some motion artifact. Diffuse brain atrophy evident with minor chronic white matter microvascular ischemic changes about the ventricles. No acute intracranial hemorrhage, mass lesion, definite infarction, midline shift, herniation, or extra-axial  fluid collection. No focal mass effect or edema. Normal gray-white matter differentiation. Cisterns are patent. Cerebellar atrophy as well. Atherosclerosis of the intracranial vessels. No skull abnormality. Mastoids and sinuses remain clear. IMPRESSION: Brain atrophy and chronic white matter microvascular changes. No acute process by noncontrast imaging. Electronically Signed   By: Jerilynn Mages.  Shick M.D.   On: 04/11/2016 16:36   Mr Brain Wo Contrast  04/12/2016  CLINICAL DATA:  Weakness. Seizures for the past 2 weeks. End-stage renal disease. EXAM: MRI HEAD WITHOUT CONTRAST TECHNIQUE: Multiplanar, multiecho pulse sequences of the brain and surrounding structures were obtained without intravenous contrast. COMPARISON:  Head CT from yesterday FINDINGS: Calvarium and upper cervical spine: There is advanced degenerative change at the atlantodental joint with upper canal narrowing that is overestimated when compared to axial CT images. There is borderline basilar impression. Orbits: Bilateral cataract resection. Sinuses and Mastoids: Clear. Brain: No acute abnormality such as acute infarct, hemorrhage, hydrocephalus, or mass lesion. No evidence of large vessel occlusion. 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 Remote small focus of cortical gliosis in the posterior left frontal lobe best seen on FLAIR imaging. No other cortical finding to explain seizure history. Widening of the sylvian fissures. Brain volume is overall within limits for age. IMPRESSION: 1. No acute finding. 2. Small focus of left posterior frontal cortical gliosis. No other explanation for history of seizures. 3. Remote bilateral cerebellar small vessel infarcts. Electronically Signed   By: Monte Fantasia M.D.   On: 04/12/2016 10:58   US Carotid Bilateral  04/12/2016  CLINICAL DATA:  Stroke EXAM: BILATERAL CAROTID DUPLEX ULTRASOUND TECHNIQUE: Pearline Cables scale imaging, color Doppler and duplex ultrasound were  performed of bilateral carotid and vertebral arteries in the neck. COMPARISON:  None. FINDINGS: Criteria: Quantification of carotid stenosis is based on velocity parameters that correlate the residual internal carotid diameter with NASCET-based stenosis levels, using the diameter of the distal internal carotid lumen as the denominator for stenosis measurement. The following velocity measurements were obtained: RIGHT ICA:  59 cm/sec CCA:  72 cm/sec SYSTOLIC ICA/CCA RATIO:  0.8 DIASTOLIC ICA/CCA RATIO:  0.9 ECA:  82 cm/sec LEFT ICA:  73 cm/sec CCA:  58 cm/sec SYSTOLIC ICA/CCA RATIO:  1.3 DIASTOLIC ICA/CCA RATIO:  4.5 ECA:  60 cm/sec RIGHT CAROTID ARTERY: Mild calcified plaque in the bulb. Low resistance internal carotid Doppler pattern. RIGHT VERTEBRAL ARTERY:  Antegrade with a normal Doppler pattern. LEFT CAROTID ARTERY: Moderate calcified plaque in the left bulb with some shadowing. Low resistance internal carotid Doppler pattern. LEFT VERTEBRAL ARTERY:  Antegrade with a normal Doppler pattern. IMPRESSION: Less than 50% stenosis in the right and left internal carotid arteries. Calcified plaque is noted in both bulbs. Electronically Signed   By: Marybelle Killings M.D.   On: 04/12/2016 10:14     Medications:     . amLODipine  10 mg Oral QHS  . aspirin  81 mg Oral Daily  . atorvastatin  40 mg Oral q1800  . calcium carbonate  400 mg of elemental calcium Oral TID  .  gabapentin  100 mg Oral QID  . heparin  5,000 Units Subcutaneous Q8H  . hydrALAZINE  25 mg Oral TID  . hydrOXYzine  10 mg Oral Daily  . traMADol  100 mg Oral QID     Assessment/ Plan:  [name] is a 70 y.o. black male with end-stage renal disease, hypertension, hyperlipidemia, degenerative disc disease admitted with Weakness [R53.1] URI (upper respiratory infection) [J06.9] Dysarthria [R47.1]  [facility]rology MWF [address]  1. End-stage renal disease on hemodialysis with left radiocephalic AVF: seen and examined on  hemodialysis. Tolerating treatment well. UF goal of 1.5 litres.   2. Anemia of chronic kidney disease: hemoglobin 11.4 - no indication for epo  3. Hypertension: elevated blood pressure during admission.  - bp meds given before treatment: hydralazine, amlodipine  4. Secondary hyperparathyroidism - restart sevelamr 2400mg  po TID/WM.   LOSJuleen China, Lurena Nida 4/21/20171:34 PM

## 2016-04-12 NOTE — Progress Notes (Signed)
   04/12/16 1420  Clinical Encounter Type  Visited With Patient not available  Visit Type Follow-up  Referral From Nurse  Consult/Referral To Chaplain  Spiritual Encounters  Spiritual Needs Literature  Stress Factors  Patient Stress Factors Exhausted;Health changes  Family Stress Factors Not reviewed  Visited patient who appeared asleep. Did not disturb him. Will return later. Chap. Ric Rosenberg G. [place]

## 2016-04-12 NOTE — Discharge Instructions (Signed)
Heart healthy and renal diet. Activity as tolerated.

## 2016-04-12 NOTE — Evaluation (Signed)
Physical Therapy Evaluation Patient Details Name: Brian Fleming MRN: [id number] DOB: Feb 21, 1946 Today's Date: 04/12/2016   History of Present Illness  70 y.o. male with a known history of ESRD on HD, Htn, CHF, HLP, Seizures for last 2 weeks- Started on some medication by primary care physician. Since then he is feeling overall weak. Today morning he woke up and wanted to get out of the bed he could barely do it and then he fell down while trying to walk his legs were feeling very weak, so he called EMS and came to emergency room.Pt here with generalized weakness, when asked about seizure like activity he essentially denied this.  Clinical Impression  Pt is able to ambulate with limp, but reports being baseline, around the nurses' station w/o AD.  He reports that he is feeling better and closer to his normal with mobility/ambulation.   He does well and feels confident going home with his aide.      Follow Up Recommendations No PT follow up    Equipment Recommendations       Recommendations for Other Services       Precautions / Restrictions Precautions Precautions: Fall Restrictions Weight Bearing Restrictions: No      Mobility  Bed Mobility Overal bed mobility: Independent                Transfers Overall transfer level: Independent               General transfer comment: Pt intially a little unsteady on getting up but quickly gets his balance and shows relative safety.  Ambulation/Gait Ambulation/Gait assistance: Modified independent (Device/Increase time) Ambulation Distance (Feet): 200 Feet Assistive device: None       General Gait Details: Pt with a limp on L foot (2/2 bunion) but was able to maintain consistent speed and show no real safey issues with prolonged ambulation.   Stairs            Wheelchair Mobility    Modified Rankin (Stroke Patients Only)       Balance Overall balance assessment: Modified Independent                                            Pertinent Vitals/Pain Pain Assessment:  (reports bunion pain)    Home Living Family/patient expects to be discharged to:: Private residence Living Arrangements: Alone Available Help at Discharge: Personal care attendant (QD)           Home Equipment: Gilford Rile - 2 wheels;Cane - single point      Prior Function Level of Independence: Independent               Hand Dominance        Extremity/Trunk Assessment   Upper Extremity Assessment: Overall WFL for tasks assessed           Lower Extremity Assessment: Overall WFL for tasks assessed         Communication   Communication: No difficulties  Cognition Arousal/Alertness: Awake/alert Behavior During Therapy: WFL for tasks assessed/performed Overall Cognitive Status: Within Functional Limits for tasks assessed                      General Comments      Exercises        Assessment/Plan    PT Assessment Patent does not need any further PT services  PT Diagnosis Difficulty walking   PT Problem List    PT Treatment Interventions     PT Goals (Current goals can be found in the Care Plan section) Acute Rehab PT Goals Patient Stated Goal: go home PT Goal Formulation: With patient Time For Goal Achievement: 04/26/16 Potential to Achieve Goals: Good    Frequency     Barriers to discharge        Co-evaluation               End of Session Equipment Utilized During Treatment: Gait belt Activity Tolerance: Patient tolerated treatment well Patient left: with nursing/sitter in room;in bed Nurse Communication: Mobility status    Functional Assessment Tool Used: clinical judgement Functional Limitation: Mobility: Walking and moving around Mobility: Walking and Moving Around Current Status [id number]): At least 1 percent but less than 20 percent impaired, limited or restricted Mobility: Walking and Moving Around Goal Status [phone]): At least 1 percent  but less than 20 percent impaired, limited or restricted Mobility: Walking and Moving Around Discharge Status [phone]): At least 1 percent but less than 20 percent impaired, limited or restricted    Time: 0849-0905 PT Time Calculation (min) (ACUTE ONLY): 16 min   Charges:   PT Evaluation $PT Eval Low Complexity: 1 Procedure     PT G Codes:   PT G-Codes **NOT FOR INPATIENT CLASS** Functional Assessment Tool Used: clinical judgement Functional Limitation: Mobility: Walking and moving around Mobility: Walking and Moving Around Current Status [id number]): At least 1 percent but less than 20 percent impaired, limited or restricted Mobility: Walking and Moving Around Goal Status [phone]): At least 1 percent but less than 20 percent impaired, limited or restricted Mobility: Walking and Moving Around Discharge Status [phone]): At least 1 percent but less than 20 percent impaired, limited or restricted   Wayne Both, PT, DPT [phone]  Brian Fleming 04/12/2016, 10:48 AM

## 2016-04-12 NOTE — Discharge Summary (Signed)
[facility] NAME: Brian Fleming    MR#:  [id number]  DATE OF BIRTH:  1946/08/29  DATE OF ADMISSION:  04/11/2016 ADMITTING PHYSICIAN: Vaughan Basta, MD  DATE OF DISCHARGE:  04/12/2016 PRIMARY CARE PHYSICIAN: Marden Noble, MD    ADMISSION DIAGNOSIS:  Weakness [R53.1] URI (upper respiratory infection) [J06.9] Dysarthria [R47.1]   DISCHARGE DIAGNOSIS:   Generalized weakness SECONDARY DIAGNOSIS:   Past Medical History  Diagnosis Date  . End stage renal disease on dialysis ([place])   . Hypertension   . CHF (congestive heart failure) ([place])   . Hyperlipidemia   . DJD (degenerative joint disease)   . Seizures [facility])     HOSPITAL COURSE:  * Generalized weakness  CT of the head is negative, chest x-ray is clear.  Patient does not have any fever or elevated white cell count.  I'm not very sure about the reason for his generalized weakness.  Possibly it may be a stroke.  Patient is also seen for last 2 weeks he is having some partial seizures frequently, and he is started on medication by his primary care physician for that.  No acute infarct per MRI of the brain. No need of home PT per physical therapy evaluation. Started aspirin and Lipitor.  * End-stage renal disease on hemodialysis  Continue his routine hemodialysis.  * Hypertension  Continue home medications.  Hyperglycemia. Start aspirin and Lipitor.  * Smoking Counseled to quit smoking.  DISCHARGE CONDITIONS:   Stable, discharge to home today.  CONSULTS OBTAINED:  Treatment Team:  Alexis Goodell, MD Lavonia Dana, MD  DRUG ALLERGIES:  No Known Allergies  DISCHARGE MEDICATIONS:   Current Discharge Medication List    START taking these medications   Details  aspirin EC 81 MG tablet Take 1 tablet (81 mg total) by mouth daily. Qty: 30 tablet, Refills: 2    atorvastatin (LIPITOR) 40 MG tablet Take 1 tablet (40 mg total) by  mouth daily at 6 PM. Qty: 30 tablet, Refills: 0      CONTINUE these medications which have NOT CHANGED   Details  amLODipine (NORVASC) 10 MG tablet Take 10 mg by mouth at bedtime.    calcium carbonate (TUMS - DOSED IN MG ELEMENTAL CALCIUM) 500 MG chewable tablet Chew 2 tablets (400 mg of elemental calcium total) by mouth 3 (three) times daily. Qty: 40 tablet, Refills: 0    gabapentin (NEURONTIN) 100 MG capsule Take 100 mg by mouth 4 (four) times daily.    hydrALAZINE (APRESOLINE) 25 MG tablet Take 1 tablet (25 mg total) by mouth 3 (three) times daily. Qty: 60 tablet, Refills: 0    hydrOXYzine (ATARAX/VISTARIL) 10 MG tablet Take 10 mg by mouth daily.    traMADol (ULTRAM) 50 MG tablet Take 100 mg by mouth 4 (four) times daily.          DISCHARGE INSTRUCTIONS:    If you experience worsening of your admission symptoms, develop shortness of breath, life threatening emergency, suicidal or homicidal thoughts you must seek medical attention immediately by calling 911 or calling your MD immediately  if symptoms less severe.  You Must read complete instructions/literature along with all the possible adverse reactions/side effects for all the Medicines you take and that have been prescribed to you. Take any new Medicines after you have completely understood and accept all the possible adverse reactions/side effects.   Please note  You were cared for by a hospitalist during your hospital  stay. If you have any questions about your discharge medications or the care you received while you were in the hospital after you are discharged, you can call the unit and asked to speak with the hospitalist on call if the hospitalist that took care of you is not available. Once you are discharged, your primary care physician will handle any further medical issues. Please note that NO REFILLS for any discharge medications will be authorized once you are discharged, as it is imperative that you return to your  primary care physician (or establish a relationship with a primary care physician if you do not have one) for your aftercare needs so that they can reassess your need for medications and monitor your lab values.    Today   SUBJECTIVE   No complaint. On HD.   VITAL SIGNS:  Blood pressure 162/100, pulse 82, temperature 97.7 F (36.5 C), temperature source Oral, resp. rate 16, height 5\' 8"  (1.727 m), weight 96.616 kg (213 lb), SpO2 99 %.  I/O:   Intake/Output Summary (Last 24 hours) at 04/12/16 1424 Last data filed at 04/12/16 0758  Gross per 24 hour  Intake    240 ml  Output      0 ml  Net    240 ml    PHYSICAL EXAMINATION:  GENERAL:  70 y.o.-year-old patient lying in the bed with no acute distress.  EYES: Pupils equal, round, reactive to light and accommodation. No scleral icterus. Extraocular muscles intact.  HEENT: Head atraumatic, normocephalic. Oropharynx and nasopharynx clear.  NECK:  Supple, no jugular venous distention. No thyroid enlargement, no tenderness.  LUNGS: Normal breath sounds bilaterally, no wheezing, rales,rhonchi or crepitation. No use of accessory muscles of respiration.  CARDIOVASCULAR: S1, S2 normal. No murmurs, rubs, or gallops.  ABDOMEN: Soft, non-tender, non-distended. Bowel sounds present. No organomegaly or mass.  EXTREMITIES: No pedal edema, cyanosis, or clubbing.  NEUROLOGIC: Cranial nerves II through XII are intact. Muscle strength 5/5 in all extremities. Sensation intact. Gait not checked.  PSYCHIATRIC: The patient is alert and oriented x 3.  SKIN: No obvious rash, lesion, or ulcer.   DATA REVIEW:   CBC  Recent Labs Lab 04/12/16 0525  WBC 3.5*  HGB 11.4*  HCT 34.6*  PLT 133*    Chemistries   Recent Labs Lab 04/11/16 1553 04/12/16 0525  NA 136 137  K 4.4 4.1  CL 93* 95*  CO2 29 30  GLUCOSE 87 87  BUN 45* 51*  CREATININE 9.82* 10.71*  CALCIUM 8.3* 7.8*  AST 22  --   ALT 15*  --   ALKPHOS 91  --   BILITOT 0.6  --      Cardiac Enzymes  Recent Labs Lab 04/11/16 1553  TROPONINI 0.04*    Microbiology Results  Results for orders placed or performed during the hospital encounter of 04/11/16  MRSA PCR Screening     Status: None   Collection Time: 04/12/16  5:32 AM  Result Value Ref Range Status   MRSA by PCR NEGATIVE NEGATIVE Final    Comment:        The GeneXpert MRSA Assay (FDA approved for NASAL specimens only), is one component of a comprehensive MRSA colonization surveillance program. It is not intended to diagnose MRSA infection nor to guide or monitor treatment for MRSA infections.     RADIOLOGY:  Dg Chest 2 View  04/11/2016  CLINICAL DATA:  Shaking and slurred speech 2 weeks ago. The patient became unable to walk today. Initial  encounter. EXAM: CHEST  2 VIEW COMPARISON:  PA and lateral chest 02/13/2015. FINDINGS: There is cardiomegaly without edema. The lungs are clear. No pneumothorax or pleural effusion. No focal bony abnormality. IMPRESSION: Cardiomegaly without acute disease. Electronically Signed   By: Inge Rise M.D.   On: 04/11/2016 16:46   Ct Head Wo Contrast  04/11/2016  CLINICAL DATA:  Weakness, stroke symptoms, difficulty ambulating, slurred speech EXAM: CT HEAD WITHOUT CONTRAST TECHNIQUE: Contiguous axial images were obtained from the base of the skull through the vertex without contrast. COMPARISON:  None available FINDINGS: Limited with some motion artifact. Diffuse brain atrophy evident with minor chronic white matter microvascular ischemic changes about the ventricles. No acute intracranial hemorrhage, mass lesion, definite infarction, midline shift, herniation, or extra-axial fluid collection. No focal mass effect or edema. Normal gray-white matter differentiation. Cisterns are patent. Cerebellar atrophy as well. Atherosclerosis of the intracranial vessels. No skull abnormality. Mastoids and sinuses remain clear. IMPRESSION: Brain atrophy and chronic white matter  microvascular changes. No acute process by noncontrast imaging. Electronically Signed   By: Jerilynn Mages.  Shick M.D.   On: 04/11/2016 16:36   Mr Brain Wo Contrast  04/12/2016  CLINICAL DATA:  Weakness. Seizures for the past 2 weeks. End-stage renal disease. EXAM: MRI HEAD WITHOUT CONTRAST TECHNIQUE: Multiplanar, multiecho pulse sequences of the brain and surrounding structures were obtained without intravenous contrast. COMPARISON:  Head CT from yesterday FINDINGS: Calvarium and upper cervical spine: There is advanced degenerative change at the atlantodental joint with upper canal narrowing that is overestimated when compared to axial CT images. There is borderline basilar impression. Orbits: Bilateral cataract resection. Sinuses and Mastoids: Clear. Brain: No acute abnormality such as acute infarct, hemorrhage, hydrocephalus, or mass lesion. No evidence of large vessel occlusion. 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 Remote small focus of cortical gliosis in the posterior left frontal lobe best seen on FLAIR imaging. No other cortical finding to explain seizure history. Widening of the sylvian fissures. Brain volume is overall within limits for age. IMPRESSION: 1. No acute finding. 2. Small focus of left posterior frontal cortical gliosis. No other explanation for history of seizures. 3. Remote bilateral cerebellar small vessel infarcts. Electronically Signed   By: Monte Fantasia M.D.   On: 04/12/2016 10:58   US Carotid Bilateral  04/12/2016  CLINICAL DATA:  Stroke EXAM: BILATERAL CAROTID DUPLEX ULTRASOUND TECHNIQUE: Pearline Cables scale imaging, color Doppler and duplex ultrasound were performed of bilateral carotid and vertebral arteries in the neck. COMPARISON:  None. FINDINGS: Criteria: Quantification of carotid stenosis is based on velocity parameters that correlate the residual internal carotid diameter with NASCET-based stenosis levels, using the diameter of the distal  internal carotid lumen as the denominator for stenosis measurement. The following velocity measurements were obtained: RIGHT ICA:  59 cm/sec CCA:  72 cm/sec SYSTOLIC ICA/CCA RATIO:  0.8 DIASTOLIC ICA/CCA RATIO:  0.9 ECA:  82 cm/sec LEFT ICA:  73 cm/sec CCA:  58 cm/sec SYSTOLIC ICA/CCA RATIO:  1.3 DIASTOLIC ICA/CCA RATIO:  4.5 ECA:  60 cm/sec RIGHT CAROTID ARTERY: Mild calcified plaque in the bulb. Low resistance internal carotid Doppler pattern. RIGHT VERTEBRAL ARTERY:  Antegrade with a normal Doppler pattern. LEFT CAROTID ARTERY: Moderate calcified plaque in the left bulb with some shadowing. Low resistance internal carotid Doppler pattern. LEFT VERTEBRAL ARTERY:  Antegrade with a normal Doppler pattern. IMPRESSION: Less than 50% stenosis in the right and left internal carotid arteries. Calcified plaque is noted in both bulbs. Electronically Signed  By: Marybelle Killings M.D.   On: 04/12/2016 10:14        Management plans discussed with the patient, family and they are in agreement.  CODE STATUS:     Code Status Orders        Start     Ordered   04/11/16 2033  Full code   Continuous     04/11/16 2032    Code Status History    Date Active Date Inactive Code Status Order ID Comments User Context   08/20/2015  3:53 PM 08/22/2015  4:21 PM Full Code [id number]  Henreitta Leber, MD Inpatient      TOTAL TIME TAKING CARE OF THIS PATIENT: 25 minutes.    Demetrios Loll M.D on 04/12/2016 at 2:24 PM  Between 7am to 6pm - Pager - [phone]  After 6pm go to [email or web address] - password [email or web address] [facility]  Office  [phone]  CC: Primary care physician; Marden Noble, MD

## 2016-04-12 NOTE — Clinical Social Work Note (Signed)
Clinical Social Worker consulted for [employer]. CSW discussed pt with RN, RNCM, and Agricultural consultant. Pt is Medicare OBS and does not need PT follow up per PT. Pt has care givers in the home. Pt is ready for discharge discharge. CSW is signing off as no further needs identified.   Darden Dates, MSW, LCSW  Clinical Social Worker [phone]

## 2016-04-12 NOTE — Progress Notes (Signed)
PRE TX

## 2016-04-12 NOTE — Progress Notes (Signed)
   04/12/16 1300  Clinical Encounter Type  Visited With Family  Visit Type Follow-up  Referral From Nurse  Consult/Referral To Chaplain  Spiritual Encounters  Spiritual Needs Literature  Stress Factors  Family Stress Factors Major life changes  Advance Directives (For Healthcare)  Does patient have an advance directive? No  Would patient like information on creating an advanced directive? Yes - Educational materials given  Met w/family members while Sharron was out his room for unknown. Provided education for spouse and advised her to ask nursing staff to page the on-call chaplain when they were prepared to proceed. We would coordinate notary & witnesses. Chap. Danny G. Nobles, ext. [phone] 

## 2016-04-12 NOTE — Progress Notes (Signed)
   04/12/16 0930  Clinical Encounter Type  Visited With Patient not available  Visit Type Initial  Referral From Nurse  Consult/Referral To Chaplain  Spiritual Encounters  Spiritual Needs Literature  Stress Factors  Patient Stress Factors Health changes  Family Stress Factors Not reviewed  Advance Directives (For Healthcare)  Does patient have an advance directive? No  Would patient like information on creating an advanced directive? Yes - Educational materials given  Patient was not in his room. Left Advance Directive materials on his food tray with note to ask nurse staff to page on-call chaplain when he was ready to proceed. Chap. Sanay Belmar G. [place]

## 2016-04-12 NOTE — Progress Notes (Signed)
   04/12/16 1500  Clinical Encounter Type  Visited With Patient  Visit Type Follow-up  Referral From Nurse  Consult/Referral To Chaplain  Spiritual Encounters  Spiritual Needs Literature  Stress Factors  Patient Stress Factors Exhausted;Health changes  Family Stress Factors Major life changes  Advance Directives (For Healthcare)  Does patient have an advance directive? No  Would patient like information on creating an advanced directive? Yes - Educational materials given  Patient had returned to room. He appeared extremely fatigued. Advised that we would return when family members visit in order to proceed with Advance Directive if he so wished. Chap. Miquela Costabile G. [place]

## 2016-04-12 NOTE — Care Management Obs Status (Signed)
[facility] NOTIFICATION   Patient Details  Name: Brian Fleming MRN: [id number] Date of Birth: 07/28/1946   Medicare Observation Status Notification Given:  Yes    Shelbie Ammons, RN 04/12/2016, 12:49 PM

## 2016-12-12 ENCOUNTER — Encounter (INDEPENDENT_AMBULATORY_CARE_PROVIDER_SITE_OTHER): Payer: Self-pay

## 2016-12-13 ENCOUNTER — Other Ambulatory Visit (INDEPENDENT_AMBULATORY_CARE_PROVIDER_SITE_OTHER): Payer: Self-pay | Admitting: Vascular Surgery

## 2016-12-16 MED ORDER — DEXTROSE 5 % IV SOLN: 1.5000 g | INTRAVENOUS | Status: DC

## 2016-12-17 ENCOUNTER — Ambulatory Visit: Admission: RE | Admit: 2016-12-17 | Payer: Medicare Other | Source: Ambulatory Visit | Admitting: Vascular Surgery

## 2016-12-17 ENCOUNTER — Encounter: Admission: RE | Payer: Self-pay | Source: Ambulatory Visit

## 2016-12-17 SURGERY — A/V FISTULAGRAM
Anesthesia: Moderate Sedation | Laterality: Left

## 2016-12-17 MED ORDER — FAMOTIDINE 20 MG PO TABS: 40.0000 mg | ORAL_TABLET | ORAL | Status: DC | PRN

## 2016-12-17 MED ORDER — CEFAZOLIN IN D5W 1 GM/50ML IV SOLN: 1.0000 g | Freq: Once | INTRAVENOUS | Status: DC

## 2016-12-17 MED ORDER — HYDROMORPHONE HCL 1 MG/ML IJ SOLN: 1.0000 mg | Freq: Once | INTRAMUSCULAR | Status: DC

## 2016-12-17 MED ORDER — SODIUM CHLORIDE 0.9 % IV SOLN: INTRAVENOUS | Status: DC

## 2016-12-17 MED ORDER — ONDANSETRON HCL 4 MG/2ML IJ SOLN: 4.0000 mg | Freq: Four times a day (QID) | INTRAMUSCULAR | Status: DC | PRN

## 2016-12-17 MED ORDER — METHYLPREDNISOLONE SODIUM SUCC 125 MG IJ SOLR: 125.0000 mg | INTRAMUSCULAR | Status: DC | PRN

## 2016-12-20 ENCOUNTER — Encounter (INDEPENDENT_AMBULATORY_CARE_PROVIDER_SITE_OTHER): Payer: Self-pay

## 2016-12-24 ENCOUNTER — Telehealth (INDEPENDENT_AMBULATORY_CARE_PROVIDER_SITE_OTHER): Payer: Self-pay

## 2016-12-24 ENCOUNTER — Encounter: Admission: RE | Payer: Self-pay | Source: Ambulatory Visit

## 2016-12-24 ENCOUNTER — Ambulatory Visit: Admission: RE | Admit: 2016-12-24 | Payer: Medicare Other | Source: Ambulatory Visit | Admitting: Vascular Surgery

## 2016-12-24 SURGERY — A/V FISTULAGRAM
Anesthesia: Moderate Sedation | Laterality: Left

## 2016-12-24 NOTE — Telephone Encounter (Signed)
Becca from [facility] called to cancel the patient's procedure and she will call back to reschedule. This was done.

## 2016-12-24 NOTE — Telephone Encounter (Signed)
-----   Message from Pauline Good sent at 12/20/2016  4:33 PM EST ----- Regarding: Cx pt's procedure Per Selena Batten cancel pt's procedure and she will call back to r/s

## 2017-01-26 ENCOUNTER — Encounter: Payer: Self-pay | Admitting: Emergency Medicine

## 2017-01-26 ENCOUNTER — Emergency Department
Admission: EM | Admit: 2017-01-26 | Discharge: 2017-01-27 | Disposition: A | Discharge status: Home / Self Care | Payer: Medicare Other | Attending: Emergency Medicine | Admitting: Emergency Medicine

## 2017-01-26 DIAGNOSIS — Z7982 Long term (current) use of aspirin: Secondary | ICD-10-CM | POA: Diagnosis not present

## 2017-01-26 DIAGNOSIS — F17210 Nicotine dependence, cigarettes, uncomplicated: Secondary | ICD-10-CM | POA: Insufficient documentation

## 2017-01-26 DIAGNOSIS — Z79899 Other long term (current) drug therapy: Secondary | ICD-10-CM | POA: Diagnosis not present

## 2017-01-26 DIAGNOSIS — Z992 Dependence on renal dialysis: Secondary | ICD-10-CM | POA: Insufficient documentation

## 2017-01-26 DIAGNOSIS — N186 End stage renal disease: Secondary | ICD-10-CM | POA: Insufficient documentation

## 2017-01-26 DIAGNOSIS — K8020 Calculus of gallbladder without cholecystitis without obstruction: Secondary | ICD-10-CM | POA: Diagnosis not present

## 2017-01-26 DIAGNOSIS — I509 Heart failure, unspecified: Secondary | ICD-10-CM | POA: Insufficient documentation

## 2017-01-26 DIAGNOSIS — I132 Hypertensive heart and chronic kidney disease with heart failure and with stage 5 chronic kidney disease, or end stage renal disease: Secondary | ICD-10-CM | POA: Insufficient documentation

## 2017-01-26 DIAGNOSIS — R109 Unspecified abdominal pain: Secondary | ICD-10-CM

## 2017-01-26 LAB — CBC
HCT: 39.1 % — ABNORMAL LOW (ref 40.0–52.0)
Hemoglobin: 12.9 g/dL — ABNORMAL LOW (ref 13.0–18.0)
MCH: 30.8 pg (ref 26.0–34.0)
MCHC: 33 g/dL (ref 32.0–36.0)
MCV: 93.3 fL (ref 80.0–100.0)
PLATELETS: 131 10*3/uL — AB (ref 150–440)
RBC: 4.19 MIL/uL — AB (ref 4.40–5.90)
RDW: 14.8 % — AB (ref 11.5–14.5)
WBC: 4.5 10*3/uL (ref 3.8–10.6)

## 2017-01-26 NOTE — ED Notes (Addendum)
Pt receives dialysis M,W,F. Pt stating that his back started hurting last week that he used some arthritis cream that helped.

## 2017-01-26 NOTE — ED Triage Notes (Signed)
Pt was sitting at home watching the football game when he started with a sharp right lower back pain. Pt stating that the pain is shooting up his right flank. Pt denies any injuries. Pt stating pain 15 at this time. Pt is a dialysis pt that is anuric. 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 Pt coming from home. Pt had surgery on his back 3 years ago.

## 2017-01-27 ENCOUNTER — Emergency Department: Payer: Medicare Other

## 2017-01-27 ENCOUNTER — Encounter: Payer: Self-pay | Admitting: Radiology

## 2017-01-27 DIAGNOSIS — K8020 Calculus of gallbladder without cholecystitis without obstruction: Secondary | ICD-10-CM | POA: Diagnosis not present

## 2017-01-27 LAB — COMPREHENSIVE METABOLIC PANEL
ALK PHOS: 73 U/L (ref 38–126)
ALT: 21 U/L (ref 17–63)
ANION GAP: 16 — AB (ref 5–15)
AST: 24 U/L (ref 15–41)
Albumin: 3.6 g/dL (ref 3.5–5.0)
BUN: 67 mg/dL — ABNORMAL HIGH (ref 6–20)
CALCIUM: 9.7 mg/dL (ref 8.9–10.3)
CHLORIDE: 100 mmol/L — AB (ref 101–111)
CO2: 24 mmol/L (ref 22–32)
CREATININE: 12.98 mg/dL — AB (ref 0.61–1.24)
GFR, EST AFRICAN AMERICAN: 4 mL/min — AB (ref >60)
GFR, EST NON AFRICAN AMERICAN: 3 mL/min — AB (ref >60)
Glucose, Bld: 103 mg/dL — ABNORMAL HIGH (ref 65–99)
Potassium: 4.9 mmol/L (ref 3.5–5.1)
SODIUM: 140 mmol/L (ref 135–145)
Total Bilirubin: 1.3 mg/dL — ABNORMAL HIGH (ref 0.3–1.2)
Total Protein: 7.3 g/dL (ref 6.5–8.1)

## 2017-01-27 MED ORDER — IOPAMIDOL (ISOVUE-370) INJECTION 76%
100.0000 mL | Freq: Once | INTRAVENOUS | Status: AC | PRN
  Administered 2017-01-27: 100 mL via INTRAVENOUS

## 2017-01-27 MED ORDER — LIDOCAINE 5 % EX PTCH
1.0000 | MEDICATED_PATCH | CUTANEOUS | Status: DC
  Administered 2017-01-27: 1 via TRANSDERMAL
  Filled 2017-01-27: qty 1

## 2017-01-27 MED ORDER — ONDANSETRON HCL 4 MG/2ML IJ SOLN
4.0000 mg | Freq: Once | INTRAMUSCULAR | Status: AC
  Administered 2017-01-27: 4 mg via INTRAVENOUS
  Filled 2017-01-27: qty 2

## 2017-01-27 MED ORDER — ETODOLAC 200 MG PO CAPS: 200.0000 mg | ORAL_CAPSULE | Freq: Three times a day (TID) | ORAL | 0 refills | Status: DC

## 2017-01-27 MED ORDER — MORPHINE SULFATE (PF) 2 MG/ML IV SOLN
2.0000 mg | Freq: Once | INTRAVENOUS | Status: AC
  Administered 2017-01-27: 2 mg via INTRAVENOUS
  Filled 2017-01-27: qty 1

## 2017-01-27 MED ORDER — LIDOCAINE 5 % EX PTCH: 1.0000 | MEDICATED_PATCH | Freq: Two times a day (BID) | CUTANEOUS | 0 refills | Status: AC

## 2017-01-27 NOTE — ED Notes (Signed)
Patient transported to CT 

## 2017-01-27 NOTE — ED Notes (Signed)

## 2017-01-27 NOTE — ED Provider Notes (Signed)
[facility] Emergency Department Provider Note   ____________________________________________   First MD Initiated Contact with Patient 01/26/17 2329     (approximate)  I have reviewed the triage vital signs and the nursing notes.   HISTORY  Chief Complaint Back Pain    HPI Brian Fleming is a 71 y.o. male who comes into the hospital today with some right-sided back pain. He reports it is all the way up the side from his low back up to the ribs. He reports it started this evening. He took a shower and got dressed and he reports he was watching the football game when it started hurting. He reports he had this pain a very long time ago and had surgery on his back as his spine was pushing in his right side. The patient rates his pain 8 out of 10 in intensity. He didn't take any medications for pain at home. The patient denies any nausea or vomiting. He says he still thirsty. He has end-stage renal disease patient on dialysis Monday Wednesday and Friday. His last dialysis was on Friday. The patient denies any chest pain, shortness of breath, abdominal pain. He is here today for evaluation.   Past Medical History:  Diagnosis Date  . CHF (congestive heart failure) ([place])   . DJD (degenerative joint disease)   . End stage renal disease on dialysis ([place])   . Hyperlipidemia   . Hypertension     Patient Active Problem List   Diagnosis Date Noted  . Generalized weakness 04/11/2016  . CHF (congestive heart failure) ([place]) 08/20/2015    Past Surgical History:  Procedure Laterality Date  . AV FISTULA PLACEMENT    . BACK SURGERY      Prior to Admission medications   Medication Sig Start Date End Date Taking? Authorizing Provider  amLODipine (NORVASC) 10 MG tablet Take 10 mg by mouth at bedtime. 04/29/14   Historical Provider, MD  aspirin EC 81 MG tablet Take 1 tablet (81 mg total) by mouth daily. 04/12/16   Demetrios Loll, MD  atorvastatin (LIPITOR) 40 MG tablet  Take 1 tablet (40 mg total) by mouth daily at 6 PM. 04/12/16   Demetrios Loll, MD  calcium carbonate (TUMS - DOSED IN MG ELEMENTAL CALCIUM) 500 MG chewable tablet Chew 2 tablets (400 mg of elemental calcium total) by mouth 3 (three) times daily. 08/22/15   Epifanio Lesches, MD  etodolac (LODINE) 200 MG capsule Take 1 capsule (200 mg total) by mouth every 8 (eight) hours. 01/27/17   Loney Hering, MD  gabapentin (NEURONTIN) 100 MG capsule Take 100 mg by mouth 4 (four) times daily.    Historical Provider, MD  hydrALAZINE (APRESOLINE) 25 MG tablet Take 1 tablet (25 mg total) by mouth 3 (three) times daily. 08/22/15   Epifanio Lesches, MD  hydrOXYzine (ATARAX/VISTARIL) 10 MG tablet Take 10 mg by mouth daily.    Historical Provider, MD  lidocaine (LIDODERM) 5 % Place 1 patch onto the skin every 12 (twelve) hours. Remove & Discard patch within 12 hours or as directed by MD 01/27/17 01/27/18  Loney Hering, MD  traMADol (ULTRAM) 50 MG tablet Take 100 mg by mouth 4 (four) times daily.     Historical Provider, MD    Allergies Patient has no known allergies.  Family History  Problem Relation Age of Onset  . Hypertension Brother     Social History Social History  Substance Use Topics  . Smoking status: Current Every Day Smoker  Packs/day: 0.25    Types: Cigarettes  . Smokeless tobacco: Not on file  . Alcohol use No    Review of Systems Constitutional: No fever/chills Eyes: No visual changes. ENT: No sore throat. Cardiovascular: Denies chest pain. Respiratory: Denies shortness of breath. Gastrointestinal: No abdominal pain.  No nausea, no vomiting.  No diarrhea.  No constipation. Genitourinary: Negative for dysuria. Musculoskeletal: Right sided back pain  Skin: Negative for rash. Neurological: Negative for headaches, focal weakness or numbness.  10-point ROS otherwise negative.  ____________________________________________   PHYSICAL EXAM:  VITAL SIGNS: ED Triage Vitals  Enc  Vitals Group     BP 01/26/17 2243 (!) 230/113     Pulse Rate 01/26/17 2243 84     Resp 01/26/17 2243 20     Temp 01/26/17 2243 97.6 F (36.4 C)     Temp Source 01/26/17 2243 Oral     SpO2 01/26/17 2243 100 %     Weight 01/26/17 2245 222 lb (100.7 kg)     Height 01/26/17 2245 5\' 8"  (1.727 m)     Head Circumference --      Peak Flow --      Pain Score 01/26/17 2245 10     Pain Loc --      Pain Edu? --      Excl. in [place]? --     Constitutional: Alert and oriented. Well appearing and in Moderate distress. Eyes: Conjunctivae are normal. PERRL. EOMI. Head: Atraumatic. Nose: No congestion/rhinnorhea. Mouth/Throat: Mucous membranes are moist.  Oropharynx non-erythematous. Cardiovascular: Normal rate, regular rhythm. Grossly normal heart sounds.  Good peripheral circulation. Respiratory: Normal respiratory effort.  No retractions. Lungs CTAB. Gastrointestinal: Soft and nontender. No distention. Positive bowel sounds Musculoskeletal: No lower extremity tenderness nor edema.  Neurologic:  Normal speech and language.  Skin:  Skin is warm, dry and intact.  Psychiatric: Mood and affect are normal.   ____________________________________________   LABS (all labs ordered are listed, but only abnormal results are displayed)  Labs Reviewed  CBC - Abnormal; Notable for the following:       Result Value   RBC 4.19 (*)    Hemoglobin 12.9 (*)    HCT 39.1 (*)    RDW 14.8 (*)    Platelets 131 (*)    All other components within normal limits  COMPREHENSIVE METABOLIC PANEL - Abnormal; Notable for the following:    Chloride 100 (*)    Glucose, Bld 103 (*)    BUN 67 (*)    Creatinine, Ser 12.98 (*)    Total Bilirubin 1.3 (*)    GFR calc non Af Amer 3 (*)    GFR calc Af Amer 4 (*)    Anion gap 16 (*)    All other components within normal limits   ____________________________________________  EKG  ED ECG REPORT I, Loney Hering, the attending physician, personally viewed and  interpreted this ECG.   Date: 01/26/2017  EKG Time: 2247  Rate: 85  Rhythm: normal sinus rhythm  Axis: normal  Intervals:none  ST&T Change: none  ____________________________________________  RADIOLOGY  CT angio abd and pelvis ____________________________________________   PROCEDURES  Procedure(s) performed: None  Procedures  Critical Care performed: No  ____________________________________________   INITIAL IMPRESSION / ASSESSMENT AND PLAN / ED COURSE  Pertinent labs & imaging results that were available during my care of the patient were reviewed by me and considered in my medical decision making (see chart for details).  This is a 71 year old male who  comes into the hospital today with right-sided flank pain. The patient's blood pressure is elevated significantly here and he has a dialysis patient. He reports that he doesn't have this pain often so I am unable to state that this is chronic back pain. Given the patient's blood pressure and asthma history my concern is a possible aneurysm versus dissection. I will perform a CT angios on the patient's right flank. He received 2 mg of morphine as well as some Zofran. The patient's blood work is unremarkable. I will reassess the patient once I received his imaging results.  Clinical Course as of Jan 28 332  Mon Jan 27, 2017  2956 VASCULAR  Aortic atherosclerosis with diffuse calcifications throughout the aorta and major branch vessels. No evidence of aneurysm, dissection, or focal occlusion.  NON-VASCULAR  Polycystic renal disease. Bone sclerosis consistent with renal osteodystrophy. Cholelithiasis. Diverticulosis of the colon without evidence of diverticulitis. Prostate enlargement.   CT Angio Abd/Pel W and/or Wo Contrast [AW]    Clinical Course User Index [AW] Loney Hering, MD   The patient's CT scan is unremarkable. He will be discharged home to follow-up with his primary care physician. The patient  does have dialysis this morning. He should return with any worsening symptoms. His blood pressure is improved.  ____________________________________________   FINAL CLINICAL IMPRESSION(S) / ED DIAGNOSES  Final diagnoses:  Flank pain  Calculus of gallbladder without cholecystitis without obstruction      NEW MEDICATIONS STARTED DURING THIS VISIT:  New Prescriptions   ETODOLAC (LODINE) 200 MG CAPSULE    Take 1 capsule (200 mg total) by mouth every 8 (eight) hours.   LIDOCAINE (LIDODERM) 5 %    Place 1 patch onto the skin every 12 (twelve) hours. Remove & Discard patch within 12 hours or as directed by MD     Note:  This document was prepared using Dragon voice recognition software and may include unintentional dictation errors.    Loney Hering, MD 01/27/17 [phone]

## 2017-01-27 NOTE — ED Notes (Signed)
Pt repositioned for comfort and covered with warm blanket; had requested something to drink but understands waiting until CT results are back; side rails up x 2 with callbell in reach

## 2017-01-27 NOTE — ED Notes (Signed)
In to attempt 20 gauge IV for CT angio; unsuccessful; pt tolerated well; Iris, RN will attempt IV

## 2017-01-27 NOTE — ED Notes (Signed)
Pt back from CT

## 2017-02-22 ENCOUNTER — Emergency Department
Admission: EM | Admit: 2017-02-22 | Discharge: 2017-02-22 | Disposition: A | Discharge status: Home / Self Care | Payer: Medicare Other | Attending: Emergency Medicine | Admitting: Emergency Medicine

## 2017-02-22 ENCOUNTER — Emergency Department: Payer: Medicare Other

## 2017-02-22 DIAGNOSIS — F17210 Nicotine dependence, cigarettes, uncomplicated: Secondary | ICD-10-CM | POA: Diagnosis not present

## 2017-02-22 DIAGNOSIS — I132 Hypertensive heart and chronic kidney disease with heart failure and with stage 5 chronic kidney disease, or end stage renal disease: Secondary | ICD-10-CM | POA: Insufficient documentation

## 2017-02-22 DIAGNOSIS — Z7982 Long term (current) use of aspirin: Secondary | ICD-10-CM | POA: Diagnosis not present

## 2017-02-22 DIAGNOSIS — R51 Headache: Secondary | ICD-10-CM | POA: Insufficient documentation

## 2017-02-22 DIAGNOSIS — R197 Diarrhea, unspecified: Secondary | ICD-10-CM | POA: Diagnosis not present

## 2017-02-22 DIAGNOSIS — Z79899 Other long term (current) drug therapy: Secondary | ICD-10-CM | POA: Insufficient documentation

## 2017-02-22 DIAGNOSIS — N186 End stage renal disease: Secondary | ICD-10-CM | POA: Insufficient documentation

## 2017-02-22 DIAGNOSIS — Z992 Dependence on renal dialysis: Secondary | ICD-10-CM | POA: Diagnosis not present

## 2017-02-22 DIAGNOSIS — R519 Headache, unspecified: Secondary | ICD-10-CM

## 2017-02-22 DIAGNOSIS — I509 Heart failure, unspecified: Secondary | ICD-10-CM | POA: Diagnosis not present

## 2017-02-22 LAB — COMPREHENSIVE METABOLIC PANEL
ALT: 13 U/L — ABNORMAL LOW (ref 17–63)
AST: 15 U/L (ref 15–41)
Albumin: 3.4 g/dL — ABNORMAL LOW (ref 3.5–5.0)
Alkaline Phosphatase: 74 U/L (ref 38–126)
Anion gap: 16 — ABNORMAL HIGH (ref 5–15)
BUN: 53 mg/dL — ABNORMAL HIGH (ref 6–20)
CO2: 25 mmol/L (ref 22–32)
Calcium: 8.6 mg/dL — ABNORMAL LOW (ref 8.9–10.3)
Chloride: 96 mmol/L — ABNORMAL LOW (ref 101–111)
Creatinine, Ser: 8.89 mg/dL — ABNORMAL HIGH (ref 0.61–1.24)
GFR calc Af Amer: 6 mL/min — ABNORMAL LOW (ref >60)
GFR calc non Af Amer: 5 mL/min — ABNORMAL LOW (ref >60)
Glucose, Bld: 75 mg/dL (ref 65–99)
Potassium: 4.9 mmol/L (ref 3.5–5.1)
Sodium: 137 mmol/L (ref 135–145)
Total Bilirubin: 2.2 mg/dL — ABNORMAL HIGH (ref 0.3–1.2)
Total Protein: 8.2 g/dL — ABNORMAL HIGH (ref 6.5–8.1)

## 2017-02-22 LAB — CBC WITH DIFFERENTIAL/PLATELET
BASOS ABS: 0.1 10*3/uL (ref 0–0.1)
Basophils Relative: 1 %
Eosinophils Absolute: 0.5 10*3/uL (ref 0–0.7)
Eosinophils Relative: 6 %
HCT: 40.6 % (ref 40.0–52.0)
Hemoglobin: 13.4 g/dL (ref 13.0–18.0)
Lymphocytes Relative: 19 %
Lymphs Abs: 1.4 10*3/uL (ref 1.0–3.6)
MCH: 30.7 pg (ref 26.0–34.0)
MCHC: 32.8 g/dL (ref 32.0–36.0)
MCV: 93.4 fL (ref 80.0–100.0)
MONOS PCT: 12 %
Monocytes Absolute: 0.9 10*3/uL (ref 0.2–1.0)
NEUTROS PCT: 62 %
Neutro Abs: 4.7 10*3/uL (ref 1.4–6.5)
Platelets: 162 10*3/uL (ref 150–440)
RBC: 4.35 MIL/uL — AB (ref 4.40–5.90)
RDW: 14.9 % — ABNORMAL HIGH (ref 11.5–14.5)
WBC: 7.6 10*3/uL (ref 3.8–10.6)

## 2017-02-22 LAB — TROPONIN I
TROPONIN I: 0.05 ng/mL — AB (ref <0.03)
TROPONIN I: 0.05 ng/mL — AB (ref <0.03)

## 2017-02-22 LAB — LIPASE, BLOOD: Lipase: 20 U/L (ref 11–51)

## 2017-02-22 MED ORDER — SODIUM CHLORIDE 0.9 % IV BOLUS (SEPSIS)
500.0000 mL | Freq: Once | INTRAVENOUS | Status: AC
  Administered 2017-02-22: 500 mL via INTRAVENOUS

## 2017-02-22 NOTE — Discharge Instructions (Signed)
Please follow-up with your primary care physician as needed. Return to the emergency department for any new or worsening symptoms such as worsening headache, fevers, chills, or for any other concerns.  It was a pleasure to take care of you today, and thank you for coming to our emergency department.  If you have any questions or concerns before leaving please ask the nurse to grab me and I'm more than happy to go through your aftercare instructions again.  If you were prescribed any opioid pain medication today such as Norco, Vicodin, Percocet, morphine, hydrocodone, or oxycodone please make sure you do not drive when you are taking this medication as it can alter your ability to drive safely.  If you have any concerns once you are home that you are not improving or are in fact getting worse before you can make it to your follow-up appointment, please do not hesitate to call 911 and come back for further evaluation.  Darel Hong MD  Results for orders placed or performed during the hospital encounter of 02/22/17  Comprehensive metabolic panel  Result Value Ref Range   Sodium 137 135 - 145 mmol/L   Potassium 4.9 3.5 - 5.1 mmol/L   Chloride 96 (L) 101 - 111 mmol/L   CO2 25 22 - 32 mmol/L   Glucose, Bld 75 65 - 99 mg/dL   BUN 53 (H) 6 - 20 mg/dL   Creatinine, Ser 8.89 (H) 0.61 - 1.24 mg/dL   Calcium 8.6 (L) 8.9 - 10.3 mg/dL   Total Protein 8.2 (H) 6.5 - 8.1 g/dL   Albumin 3.4 (L) 3.5 - 5.0 g/dL   AST 15 15 - 41 U/L   ALT 13 (L) 17 - 63 U/L   Alkaline Phosphatase 74 38 - 126 U/L   Total Bilirubin 2.2 (H) 0.3 - 1.2 mg/dL   GFR calc non Af Amer 5 (L) >60 mL/min   GFR calc Af Amer 6 (L) >60 mL/min   Anion gap 16 (H) 5 - 15  CBC with Differential  Result Value Ref Range   WBC 7.6 3.8 - 10.6 K/uL   RBC 4.35 (L) 4.40 - 5.90 MIL/uL   Hemoglobin 13.4 13.0 - 18.0 g/dL   HCT 40.6 40.0 - 52.0 %   MCV 93.4 80.0 - 100.0 fL   MCH 30.7 26.0 - 34.0 pg   MCHC 32.8 32.0 - 36.0 g/dL   RDW 14.9 (H)  11.5 - 14.5 %   Platelets 162 150 - 440 K/uL   Neutrophils Relative % 62 %   Lymphocytes Relative 19 %   Monocytes Relative 12 %   Eosinophils Relative 6 %   Basophils Relative 1 %   Neutro Abs 4.7 1.4 - 6.5 K/uL   Lymphs Abs 1.4 1.0 - 3.6 K/uL   Monocytes Absolute 0.9 0.2 - 1.0 K/uL   Eosinophils Absolute 0.5 0 - 0.7 K/uL   Basophils Absolute 0.1 0 - 0.1 K/uL  Troponin I  Result Value Ref Range   Troponin I 0.05 (HH) <0.03 ng/mL  Lipase, blood  Result Value Ref Range   Lipase 20 11 - 51 U/L  Troponin I  Result Value Ref Range   Troponin I 0.05 (HH) <0.03 ng/mL   Dg Chest 2 View  Result Date: 02/22/2017 CLINICAL DATA:  Weakness. EXAM: CHEST  2 VIEW COMPARISON:  Radiographs of April 11, 2016. FINDINGS: The heart size and mediastinal contours are within normal limits. Both lungs are clear. No pneumothorax or pleural effusion is  noted. Atherosclerosis of thoracic aorta is noted. The visualized skeletal structures are unremarkable. IMPRESSION: No active cardiopulmonary disease.  Aortic atherosclerosis. Electronically Signed   By: Marijo Conception, M.D.   On: 02/22/2017 13:22   Ct Head Wo Contrast  Result Date: 02/22/2017 CLINICAL DATA:  Weakness.  Headache and nausea. EXAM: CT HEAD WITHOUT CONTRAST TECHNIQUE: Contiguous axial images were obtained from the base of the skull through the vertex without intravenous contrast. COMPARISON:  04/11/2016 FINDINGS: Brain: Stable cerebral atrophy. No evidence for acute hemorrhage, mass lesion, midline shift, hydrocephalus or large infarct. Vascular: Atherosclerotic calcifications. Skull: Normal. Negative for fracture or focal lesion. Sinuses/Orbits: Mild disease in posterior left ethmoid air cells. Other: Soft tissue calcifications throughout the scalp. IMPRESSION: No acute intracranial abnormality. Stable atrophy. Electronically Signed   By: Markus Daft M.D.   On: 02/22/2017 13:43   Ct Angio Abd/pel W And/or Wo Contrast  Result Date: 01/27/2017 CLINICAL  DATA:  Pt was sitting at home watching the football game when he started with a sharp right lower back pain. Pt stating that the pain is shooting up his right flank. Pt denies any injuries. Pt stating pain 15 at this time. Dialysis patient. EXAM: CTA ABDOMEN AND PELVIS wITHOUT AND WITH CONTRAST TECHNIQUE: Multidetector CT imaging of the abdomen and pelvis was performed using the standard protocol during bolus administration of intravenous contrast. Multiplanar reconstructed images and MIPs were obtained and reviewed to evaluate the vascular anatomy. CONTRAST:  100 mL Isovue 370 COMPARISON:  07/20/2010 FINDINGS: VASCULAR Aorta: Normal caliber aorta without aneurysm, dissection, vasculitis or significant stenosis. Diffuse aortic calcification. Celiac: Patent without evidence of aneurysm, dissection, vasculitis or significant stenosis. Diffuse calcification. SMA: Patent without evidence of aneurysm, dissection, vasculitis or significant stenosis. Diffuse calcification. Renals: Both renal arteries are patent without evidence of aneurysm, dissection, vasculitis, fibromuscular dysplasia or significant stenosis. Vascular calcifications are present. IMA: Patent without evidence of aneurysm, dissection, vasculitis or significant stenosis. Diffuse calcification. Inflow: Patent without evidence of aneurysm, dissection, vasculitis or significant stenosis. Diffuse calcification. Proximal Outflow: Bilateral common femoral and visualized portions of the superficial and profunda femoral arteries are patent without evidence of aneurysm, dissection, vasculitis or significant stenosis. Diffuse calcification. Veins: No obvious venous abnormality within the limitations of this arterial phase study. Review of the MIP images confirms the above findings. NON-VASCULAR Lower chest: Mild dependent changes in the lung bases. Mild cardiac enlargement. Coronary artery calcifications. Hepatobiliary: Cholelithiasis. No gallbladder wall thickening  or inflammation. No bile duct dilatation. Homogeneous appearance of the liver. Pancreas: Unremarkable. No pancreatic ductal dilatation or surrounding inflammatory changes. Spleen: Normal in size without focal abnormality. Adrenals/Urinary Tract: No adrenal gland nodules. Multiple cysts fill the renal parenchyma suggesting polycystic renal disease. No hydronephrosis. Minimal renal nephrograms are symmetrical. Hemorrhagic cyst in the lower pole right kidney. Bladder is decompressed. Bladder wall may be thickened but evaluation is limited due to nondistention. Stomach/Bowel: Stomach is within normal limits. Appendix appears normal. No evidence of bowel wall thickening, distention, or inflammatory changes. Colonic diverticulosis. Lymphatic: No significant vascular findings are present. No enlarged abdominal or pelvic lymph nodes. Reproductive: Prostate gland is diffusely enlarged. Other: No abdominal wall hernia or abnormality. No abdominopelvic ascites. Musculoskeletal: Prominent degenerative changes throughout the lumbar spine. Bone encroachment upon the neural foramina bilaterally at L3-4, L4-5 and L5-S1. 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 No destructive bone lesions. IMPRESSION: VASCULAR Aortic atherosclerosis with diffuse calcifications throughout the aorta and major branch vessels. No evidence of aneurysm, dissection, or focal occlusion. NON-VASCULAR  Polycystic renal disease. Bone sclerosis consistent with renal osteodystrophy. Cholelithiasis. Diverticulosis of the colon without evidence of diverticulitis. Prostate enlargement. Electronically Signed   By: Lucienne Capers M.D.   On: 01/27/2017 02:54

## 2017-02-22 NOTE — ED Provider Notes (Signed)
Care signed over from Dr. Burlene Arnt pending second troponin. Second troponin is stable. The patient has no pain would like to go home. He is medically stable for outpatient management.   Darel Hong, MD 02/22/17 1816

## 2017-02-22 NOTE — ED Triage Notes (Addendum)
Patient out paying bills with nurse aid and felt weak. Nurse aid called EMS due to patient feeling worse. Patient brought in via [facility].  Patient complained of headache, nausea, diarrhea.  Patient denies vomiting.  Hemodialysis patient with left upper arm access.  Hemodialysis completed yesterday.

## 2017-02-22 NOTE — ED Notes (Signed)
Pt verbalized that he wanted to leave. RN informed pt that a cardiac enzyme was elevated and that a repeat test needed to be done. RN collected 2nd troponin.

## 2017-02-22 NOTE — ED Notes (Signed)
Pt verbalized understanding of discharge instructions. NAD at this time. 

## 2017-02-22 NOTE — ED Provider Notes (Addendum)
[facility] Emergency Department Provider Note  ____________________________________________   I have reviewed the triage vital signs and the nursing notes.   HISTORY  Chief Complaint Headache    HPI Brian Fleming is a 71 y.o. male who presents today complaining of diarrhea and feeling weak. He also has a slight headache. No worsening of life. Headache has been there for 3 days. Gradual in onset. He's had watery nonbloody diarrhea as well for last few days. Denies any vomiting and has some mild nausea. Denies abdominal pain. He is end-stage renal. He did have his last dialysis as scheduled yesterday. He denies any chest pain or shortness of breath. Denies any recent antibiotics.  Denies any focal weakness he just feels generally weak.   Past Medical History:  Diagnosis Date  . CHF (congestive heart failure) ([place])   . DJD (degenerative joint disease)   . End stage renal disease on dialysis ([place])   . Hyperlipidemia   . Hypertension     Patient Active Problem List   Diagnosis Date Noted  . Generalized weakness 04/11/2016  . CHF (congestive heart failure) ([place]) 08/20/2015    Past Surgical History:  Procedure Laterality Date  . AV FISTULA PLACEMENT    . BACK SURGERY      Prior to Admission medications   Medication Sig Start Date End Date Taking? Authorizing Provider  amLODipine (NORVASC) 10 MG tablet Take 10 mg by mouth at bedtime. 04/29/14   Historical Provider, MD  aspirin EC 81 MG tablet Take 1 tablet (81 mg total) by mouth daily. 04/12/16   Demetrios Loll, MD  atorvastatin (LIPITOR) 40 MG tablet Take 1 tablet (40 mg total) by mouth daily at 6 PM. 04/12/16   Demetrios Loll, MD  calcium carbonate (TUMS - DOSED IN MG ELEMENTAL CALCIUM) 500 MG chewable tablet Chew 2 tablets (400 mg of elemental calcium total) by mouth 3 (three) times daily. 08/22/15   Epifanio Lesches, MD  etodolac (LODINE) 200 MG capsule Take 1 capsule (200 mg total) by mouth every 8 (eight)  hours. 01/27/17   Loney Hering, MD  gabapentin (NEURONTIN) 100 MG capsule Take 100 mg by mouth 4 (four) times daily.    Historical Provider, MD  hydrALAZINE (APRESOLINE) 25 MG tablet Take 1 tablet (25 mg total) by mouth 3 (three) times daily. 08/22/15   Epifanio Lesches, MD  hydrOXYzine (ATARAX/VISTARIL) 10 MG tablet Take 10 mg by mouth daily.    Historical Provider, MD  lidocaine (LIDODERM) 5 % Place 1 patch onto the skin every 12 (twelve) hours. Remove & Discard patch within 12 hours or as directed by MD 01/27/17 01/27/18  Loney Hering, MD  traMADol (ULTRAM) 50 MG tablet Take 100 mg by mouth 4 (four) times daily.     Historical Provider, MD    Allergies Patient has no known allergies.  Family History  Problem Relation Age of Onset  . Hypertension Brother     Social History Social History  Substance Use Topics  . Smoking status: Current Every Day Smoker    Packs/day: 0.25    Types: Cigarettes  . Smokeless tobacco: Not on file  . Alcohol use No    Review of Systems Constitutional: No fever/chills Eyes: No visual changes. ENT: No sore throat. No stiff neck no neck pain Cardiovascular: Denies chest pain. Respiratory: Denies shortness of breath. Gastrointestinal:   no vomiting.  Positive diarrhea.  No constipation. Genitourinary: Negative for dysuria. Musculoskeletal: Negative lower extremity swelling Skin: Negative for rash. Neurological:  Negative for severe headaches, focal weakness or numbness. 10-point ROS otherwise negative.  ____________________________________________   PHYSICAL EXAM:  VITAL SIGNS: ED Triage Vitals  Enc Vitals Group     BP 02/22/17 1222 101/65     Pulse Rate 02/22/17 1222 89     Resp 02/22/17 1222 18     Temp 02/22/17 1222 98.1 F (36.7 C)     Temp Source 02/22/17 1222 Oral     SpO2 02/22/17 1222 93 %     Weight 02/22/17 1223 210 lb (95.3 kg)     Height 02/22/17 1223 5\' 8"  (1.727 m)     Head Circumference --      Peak Flow --       Pain Score 02/22/17 1224 4     Pain Loc --      Pain Edu? --      Excl. in [place]? --     Constitutional: Alert and oriented. Well appearing and in no acute distress. Eyes: Conjunctivae are normal. PERRL. EOMI. Head: Atraumatic. Nose: No congestion/rhinnorhea. Mouth/Throat: Mucous membranes are moist.  Oropharynx non-erythematous. Neck: No stridor.   Nontender with no meningismus Cardiovascular: Normal rate, regular rhythm. Grossly normal heart sounds.  Good peripheral circulation. Respiratory: Normal respiratory effort.  No retractions. Lungs CTAB. Abdominal: Soft and nontender. No distention. No guarding no rebound Back:  There is no focal tenderness or step off.  there is no midline tenderness there are no lesions noted. there is no CVA tenderness Musculoskeletal: No lower extremity tenderness, no upper extremity tenderness. No joint effusions, no DVT signs strong distal pulses no edema Neurologic:  Normal speech and language. No gross focal neurologic deficits are appreciated.  Skin:  Skin is warm, dry and intact. No rash noted. Psychiatric: Mood and affect are normal. Speech and behavior are normal.  ____________________________________________   LABS (all labs ordered are listed, but only abnormal results are displayed)  Labs Reviewed  COMPREHENSIVE METABOLIC PANEL - Abnormal; Notable for the following:       Result Value   Chloride 96 (*)    BUN 53 (*)    Creatinine, Ser 8.89 (*)    Calcium 8.6 (*)    Total Protein 8.2 (*)    Albumin 3.4 (*)    ALT 13 (*)    Total Bilirubin 2.2 (*)    GFR calc non Af Amer 5 (*)    GFR calc Af Amer 6 (*)    Anion gap 16 (*)    All other components within normal limits  CBC WITH DIFFERENTIAL/PLATELET - Abnormal; Notable for the following:    RBC 4.35 (*)    RDW 14.9 (*)    All other components within normal limits  TROPONIN I - Abnormal; Notable for the following:    Troponin I 0.05 (*)    All other components within normal limits   LIPASE, BLOOD  URINALYSIS, COMPLETE (UACMP) WITH MICROSCOPIC   ____________________________________________  EKG  I personally interpreted any EKGs ordered by me or triage Sinus rhythm rate 89 bpm no acute ST elevation or depression nonspecific ST changes including lateral strain pattern appears to be not significantly changed from prior ____________________________________________  RADIOLOGY  I reviewed any imaging ordered by me or triage that were performed during my shift and, if possible, patient and/or family made aware of any abnormal findings. ____________________________________________   PROCEDURES  Procedure(s) performed: None  Procedures  Critical Care performed: None  ____________________________________________   INITIAL IMPRESSION / ASSESSMENT AND PLAN / ED COURSE  Pertinent labs & imaging results that were available during my care of the patient were reviewed by me and considered in my medical decision making (see chart for details).  He is very well-appearing however at baseline he does have poor health. We have given him 500 cc of fluid keeping in mind his history of CHF and renal failure. Patient feels 100% better at this time he would like to eat and drink. There is no focality to his exam but I did do a CT of his head for a CT which is negative I don't that he has meningitis. His abdominal exam is completely benign and he has had some loose stools. No recent antibiotics or recent have clostridium difficile. He has most likely a viral process. Blood work is reassuring. His only complaint now is hungry and we will feed him. Patient otherwise well appearing. Troponin is borderline but he is an end-stage renal patient we will send a second troponin a precaution.   ----------------------------------------- 3:32 PM on 02/22/2017 ----------------------------------------- Patient is in no acute distress, his only complaint is that he is hungry as noted above. We  are feeding him. We will check orthostatics and he is signed out at the end of my shift for repeat troponin pending. Dr. Mable Paris assumes care of pt at this time     ____________________________________________   FINAL CLINICAL IMPRESSION(S) / ED DIAGNOSES  Final diagnoses:  None      This chart was dictated using voice recognition software.  Despite best efforts to proofread,  errors can occur which can change meaning.      Schuyler Amor, MD 02/22/17 1452    Schuyler Amor, MD 02/22/17 [phone]

## 2017-04-18 ENCOUNTER — Encounter: Payer: Medicare Other | Admitting: Podiatry

## 2017-04-20 NOTE — Progress Notes (Signed)
This encounter was created in error - please disregard.

## 2017-07-04 ENCOUNTER — Ambulatory Visit (INDEPENDENT_AMBULATORY_CARE_PROVIDER_SITE_OTHER): Payer: Medicare Other | Admitting: Podiatry

## 2017-07-04 DIAGNOSIS — L84 Corns and callosities: Secondary | ICD-10-CM | POA: Diagnosis not present

## 2017-07-04 DIAGNOSIS — M79676 Pain in unspecified toe(s): Secondary | ICD-10-CM

## 2017-07-04 DIAGNOSIS — E0842 Diabetes mellitus due to underlying condition with diabetic polyneuropathy: Secondary | ICD-10-CM | POA: Diagnosis not present

## 2017-07-04 DIAGNOSIS — B351 Tinea unguium: Secondary | ICD-10-CM | POA: Diagnosis not present

## 2017-07-13 NOTE — Progress Notes (Signed)
   SUBJECTIVE Patient with a history of diabetes mellitus presents to office today complaining of elongated, thickened nails. Pain while ambulating in shoes. Patient is unable to trim their own nails.   OBJECTIVE General Patient is awake, alert, and oriented x 3 and in no acute distress. Derm hyperkeratotic pre-ulcerative lesions also noted to the bilateral feet 2 Skin is dry and supple bilateral. Negative open lesions or macerations. Remaining integument unremarkable. Nails are tender, long, thickened and dystrophic with subungual debris, consistent with onychomycosis, 1-5 bilateral. No signs of infection noted. Vasc  DP and PT pedal pulses palpable bilaterally. Temperature gradient within normal limits.  Neuro Epicritic and protective threshold sensation diminished bilaterally.  Musculoskeletal Exam No symptomatic pedal deformities noted bilateral. Muscular strength within normal limits.  ASSESSMENT 1. Diabetes Mellitus w/ peripheral neuropathy 2. Onychomycosis of nail due to dermatophyte bilateral 3. Pre-ulcerative callous lesions bilateral feet 2  PLAN OF CARE 1. Patient evaluated today. 2. Instructed to maintain good pedal hygiene and foot care. Stressed importance of controlling blood sugar.  3. Mechanical debridement of nails 1-5 bilaterally performed using a nail nipper. Filed with dremel without incident.  4. Excisional debridement of the pre-ulcerative callus lesions was performed using a chisel blade without incident or bleeding  5. Return to clinic in 3 mos.     Edrick Kins, DPM [facility]  Dr. Edrick Kins, [address]                                        [place], [place] [address]                Office [phone]  Fax [phone]

## 2017-09-26 ENCOUNTER — Encounter: Payer: Medicare Other | Admitting: Podiatry

## 2017-09-26 ENCOUNTER — Ambulatory Visit: Payer: Medicare Other

## 2017-09-26 DIAGNOSIS — M79673 Pain in unspecified foot: Secondary | ICD-10-CM

## 2017-10-02 ENCOUNTER — Encounter (INDEPENDENT_AMBULATORY_CARE_PROVIDER_SITE_OTHER): Payer: Medicare Other | Admitting: Vascular Surgery

## 2017-10-02 ENCOUNTER — Encounter (INDEPENDENT_AMBULATORY_CARE_PROVIDER_SITE_OTHER): Payer: Medicare Other

## 2017-10-10 ENCOUNTER — Ambulatory Visit: Payer: Medicare Other | Admitting: Podiatry

## 2017-10-16 NOTE — Progress Notes (Signed)
This encounter was created in error - please disregard.

## 2017-10-28 ENCOUNTER — Other Ambulatory Visit (INDEPENDENT_AMBULATORY_CARE_PROVIDER_SITE_OTHER): Payer: Self-pay | Admitting: Vascular Surgery

## 2017-10-28 DIAGNOSIS — N186 End stage renal disease: Secondary | ICD-10-CM

## 2017-10-28 DIAGNOSIS — Z992 Dependence on renal dialysis: Principal | ICD-10-CM

## 2017-10-30 ENCOUNTER — Encounter (INDEPENDENT_AMBULATORY_CARE_PROVIDER_SITE_OTHER): Payer: Medicare Other

## 2017-10-30 ENCOUNTER — Encounter (INDEPENDENT_AMBULATORY_CARE_PROVIDER_SITE_OTHER): Payer: Medicare Other | Admitting: Vascular Surgery

## 2017-11-11 ENCOUNTER — Ambulatory Visit (INDEPENDENT_AMBULATORY_CARE_PROVIDER_SITE_OTHER): Payer: Medicare Other | Admitting: Podiatry

## 2017-11-11 DIAGNOSIS — E0842 Diabetes mellitus due to underlying condition with diabetic polyneuropathy: Secondary | ICD-10-CM

## 2017-11-11 DIAGNOSIS — M79676 Pain in unspecified toe(s): Secondary | ICD-10-CM | POA: Diagnosis not present

## 2017-11-11 DIAGNOSIS — L989 Disorder of the skin and subcutaneous tissue, unspecified: Secondary | ICD-10-CM

## 2017-11-11 DIAGNOSIS — B351 Tinea unguium: Secondary | ICD-10-CM | POA: Diagnosis not present

## 2017-11-16 NOTE — Progress Notes (Signed)
   SUBJECTIVE Patient with a history of diabetes mellitus presents to office today complaining of elongated, thickened nails. Pain while ambulating in shoes. Patient is unable to trim their own nails. She reports painful callus lesions of bilateral great toes as well.    Past Medical History:  Diagnosis Date  . CHF (congestive heart failure) ([place])   . DJD (degenerative joint disease)   . End stage renal disease on dialysis ([place])   . Hyperlipidemia   . Hypertension      OBJECTIVE General Patient is awake, alert, and oriented x 3 and in no acute distress. Derm hyperkeratotic pre-ulcerative lesions also noted to the bilateral feet 2 Skin is dry and supple bilateral. Negative open lesions or macerations. Remaining integument unremarkable. Nails are tender, long, thickened and dystrophic with subungual debris, consistent with onychomycosis, 1-5 bilateral. No signs of infection noted. Vasc  DP and PT pedal pulses palpable bilaterally. Temperature gradient within normal limits.  Neuro Epicritic and protective threshold sensation diminished bilaterally.  Musculoskeletal Exam No symptomatic pedal deformities noted bilateral. Muscular strength within normal limits.  ASSESSMENT 1. Diabetes Mellitus w/ peripheral neuropathy 2. Onychomycosis of nail due to dermatophyte bilateral 3. Pre-ulcerative callous lesions bilateral feet 2  PLAN OF CARE 1. Patient evaluated today. 2. Instructed to maintain good pedal hygiene and foot care. Stressed importance of controlling blood sugar.  3. Mechanical debridement of nails 1-5 bilaterally performed using a nail nipper. Filed with dremel without incident.  4. Excisional debridement of the pre-ulcerative callus lesions was performed using a chisel blade without incident or bleeding  5. Return to clinic in 3 mos.     Edrick Kins, DPM [facility]  Dr. Edrick Kins, [address]                                          [place], [place] [address]                Office [phone]  Fax [phone]

## 2017-12-04 ENCOUNTER — Encounter (INDEPENDENT_AMBULATORY_CARE_PROVIDER_SITE_OTHER): Payer: Medicare Other

## 2017-12-04 ENCOUNTER — Encounter (INDEPENDENT_AMBULATORY_CARE_PROVIDER_SITE_OTHER): Payer: Medicare Other | Admitting: Vascular Surgery

## 2017-12-04 DIAGNOSIS — E785 Hyperlipidemia, unspecified: Secondary | ICD-10-CM | POA: Insufficient documentation

## 2017-12-04 DIAGNOSIS — N186 End stage renal disease: Secondary | ICD-10-CM | POA: Insufficient documentation

## 2017-12-04 DIAGNOSIS — I10 Essential (primary) hypertension: Secondary | ICD-10-CM | POA: Insufficient documentation

## 2017-12-04 NOTE — Progress Notes (Deleted)
MRN : [id number]  Brian Fleming is a 71 y.o. (1946/06/16) male who presents with chief complaint of No chief complaint on file. Marland Kitchen  History of Present Illness: ***  No outpatient medications have been marked as taking for the 12/04/17 encounter (Appointment) with Delana Meyer, Dolores Lory, MD.    Past Medical History:  Diagnosis Date  . CHF (congestive heart failure) ([place])   . DJD (degenerative joint disease)   . End stage renal disease on dialysis ([place])   . Hyperlipidemia   . Hypertension     Past Surgical History:  Procedure Laterality Date  . AV FISTULA PLACEMENT    . BACK SURGERY      Social History Social History   Tobacco Use  . Smoking status: Current Every Day Smoker    Packs/day: 0.25    Types: Cigarettes  Substance Use Topics  . Alcohol use: No  . Drug use: No    Family History Family History  Problem Relation Age of Onset  . Hypertension Brother   No family history of bleeding/clotting disorders, porphyria or autoimmune disease   No Known Allergies   REVIEW OF SYSTEMS (Negative unless checked)  Constitutional: [] Weight loss  [] Fever  [] Chills Cardiac: [] Chest pain   [] Chest pressure   [] Palpitations   [] Shortness of breath when laying flat   [] Shortness of breath with exertion. Vascular:  [] Pain in legs with walking   [] Pain in legs at rest  [] History of DVT   [] Phlebitis   [] Swelling in legs   [] Varicose veins   [] Non-healing ulcers Pulmonary:   [] Uses home oxygen   [] Productive cough   [] Hemoptysis   [] Wheeze  [] COPD   [] Asthma Neurologic:  [] Dizziness   [] Seizures   [] History of stroke   [] History of TIA  [] Aphasia   [] Vissual changes   [] Weakness or numbness in arm   [] Weakness or numbness in leg Musculoskeletal:   [] Joint swelling   [] Joint pain   [] Low back pain Hematologic:  [] Easy bruising  [] Easy bleeding   [] Hypercoagulable state   [] Anemic Gastrointestinal:  [] Diarrhea   [] Vomiting  [] Gastroesophageal reflux/heartburn   [] Difficulty  swallowing. Genitourinary:  [] Chronic kidney disease   [] Difficult urination  [] Frequent urination   [] Blood in urine Skin:  [] Rashes   [] Ulcers  Psychological:  [] History of anxiety   []  History of major depression.  Physical Examination  There were no vitals filed for this visit. There is no height or weight on file to calculate BMI. Gen: WD/WN, NAD Head: [place]/AT, No temporalis wasting.  Ear/Nose/Throat: Hearing grossly intact, nares w/o erythema or drainage, poor dentition Eyes: PER, EOMI, sclera nonicteric.  Neck: Supple, no masses.  No bruit or JVD.  Pulmonary:  Good air movement, clear to auscultation bilaterally, no use of accessory muscles.  Cardiac: RRR, normal S1, S2, no Murmurs. Vascular: *** Vessel Right Left  Radial Palpable Palpable  Ulnar Palpable Palpable  Brachial Palpable Palpable  Carotid Palpable Palpable  Femoral Palpable Palpable  Popliteal Palpable Palpable  PT Palpable Palpable  DP Palpable Palpable   Gastrointestinal: soft, non-distended. No guarding/no peritoneal signs.  Musculoskeletal: M/S 5/5 throughout.  No deformity or atrophy.  Neurologic: CN 2-12 intact. Pain and light touch intact in extremities.  Symmetrical.  Speech is fluent. Motor exam as listed above. Psychiatric: Judgment intact, Mood & affect appropriate for pt's clinical situation. Dermatologic: No rashes or ulcers noted.  No changes consistent with cellulitis. Lymph : No Cervical lymphadenopathy, no lichenification or skin changes of chronic lymphedema.  CBC Lab Results  Component Value Date   WBC 7.6 02/22/2017   HGB 13.4 02/22/2017   HCT 40.6 02/22/2017   MCV 93.4 02/22/2017   PLT 162 02/22/2017    BMET    Component Value Date/Time   NA 137 02/22/2017 1236   NA 139 02/17/2012 2149   K 4.9 02/22/2017 1236   K 5.1 02/25/2012 1118   CL 96 (L) 02/22/2017 1236   CL 98 02/17/2012 2149   CO2 25 02/22/2017 1236   CO2 22 02/17/2012 2149   GLUCOSE 75 02/22/2017 1236   GLUCOSE 70  02/17/2012 2149   BUN 53 (H) 02/22/2017 1236   BUN 40 (H) 02/17/2012 2149   CREATININE 8.89 (H) 02/22/2017 1236   CREATININE 9.36 (H) 02/17/2012 2149   CALCIUM 8.6 (L) 02/22/2017 1236   CALCIUM 8.3 (L) 02/17/2012 2149   GFRNONAA 5 (L) 02/22/2017 1236   GFRNONAA 6 (L) 02/17/2012 2149   GFRAA 6 (L) 02/22/2017 1236   GFRAA 7 (L) 02/17/2012 2149   CrCl cannot be calculated (Patient's most recent lab result is older than the maximum 21 days allowed.).  COAG No results found for: INR, PROTIME  Radiology No results found.  Outside Studies/Documentation *** pages of outside documents were reviewed.  They showed ***.  Assessment/Plan 1. End stage renal disease (HCC) ***  2. Essential hypertension ***  3. Mixed hyperlipidemia ***  4. Congestive heart failure, unspecified HF chronicity, unspecified heart failure type ([place]) ***    Hortencia Pilar, MD  12/04/2017 1:55 PM

## 2018-04-07 ENCOUNTER — Ambulatory Visit: Payer: Medicare Other | Admitting: Podiatry

## 2018-04-21 ENCOUNTER — Encounter: Payer: Medicare Other | Admitting: Podiatry

## 2018-04-27 NOTE — Progress Notes (Signed)
This encounter was created in error - please disregard.

## 2018-05-08 ENCOUNTER — Ambulatory Visit: Payer: Medicare Other | Admitting: Podiatry

## 2018-05-08 ENCOUNTER — Ambulatory Visit (INDEPENDENT_AMBULATORY_CARE_PROVIDER_SITE_OTHER): Payer: Medicare Other | Admitting: Podiatry

## 2018-05-08 DIAGNOSIS — E0842 Diabetes mellitus due to underlying condition with diabetic polyneuropathy: Secondary | ICD-10-CM

## 2018-05-08 DIAGNOSIS — M79676 Pain in unspecified toe(s): Secondary | ICD-10-CM | POA: Diagnosis not present

## 2018-05-08 DIAGNOSIS — L84 Corns and callosities: Secondary | ICD-10-CM | POA: Diagnosis not present

## 2018-05-08 DIAGNOSIS — B351 Tinea unguium: Secondary | ICD-10-CM

## 2018-05-11 NOTE — Progress Notes (Signed)
   SUBJECTIVE 72 year old male presenting today with a chief complaint of painful callus lesions to the bilateral feet that have been present for several months. Bearing weight increases the pain. He has not done anything for treatment.  He also complains of elongated, thickened nails of bilateral feet that cause pain while ambulating in shoes. He is unable to trim his own nails. Patient is here for further evaluation and treatment.   Past Medical History:  Diagnosis Date  . CHF (congestive heart failure) ([place])   . DJD (degenerative joint disease)   . End stage renal disease on dialysis ([place])   . Hyperlipidemia   . Hypertension      OBJECTIVE General Patient is awake, alert, and oriented x 3 and in no acute distress. Derm hyperkeratotic pre-ulcerative lesions also noted to the bilateral feet  3. Skin is dry and supple bilateral. Negative open lesions or macerations. Remaining integument unremarkable. Nails are tender, long, thickened and dystrophic with subungual debris, consistent with onychomycosis, 1-5 bilateral. No signs of infection noted. Vasc  DP and PT pedal pulses palpable bilaterally. Temperature gradient within normal limits.  Neuro Epicritic and protective threshold sensation diminished bilaterally.  Musculoskeletal Exam No symptomatic pedal deformities noted bilateral. Muscular strength within normal limits.  ASSESSMENT 1. Diabetes Mellitus w/ peripheral neuropathy 2. Onychomycosis of nail due to dermatophyte bilateral 3. Pre-ulcerative callous lesions bilateral feet  3  PLAN OF CARE 1. Patient evaluated today. 2. Instructed to maintain good pedal hygiene and foot care. Stressed importance of controlling blood sugar.  3. Mechanical debridement of nails 1-5 bilaterally performed using a nail nipper. Filed with dremel without incident.  4. Excisional debridement of the pre-ulcerative callus lesions was performed using a chisel blade without incident or bleeding  5.  Return to clinic in 3 mos.     Edrick Kins, DPM [facility]  Dr. Edrick Kins, [place]                                        [place], [place] [address]                Office [phone]  Fax [phone]

## 2018-08-03 ENCOUNTER — Ambulatory Visit: Payer: Self-pay | Admitting: Nurse Practitioner

## 2018-08-18 ENCOUNTER — Encounter: Payer: Medicare Other | Admitting: Podiatry

## 2018-08-21 NOTE — Progress Notes (Signed)
This encounter was created in error - please disregard.

## 2018-10-30 ENCOUNTER — Ambulatory Visit: Payer: Medicare Other | Admitting: Podiatry

## 2018-11-02 ENCOUNTER — Ambulatory Visit: Payer: Medicare Other | Admitting: Podiatry

## 2019-01-25 ENCOUNTER — Encounter: Payer: Self-pay | Admitting: Emergency Medicine

## 2019-01-25 ENCOUNTER — Inpatient Hospital Stay
Admission: EM | Admit: 2019-01-25 | Discharge: 2019-01-27 | DRG: 193 | Disposition: A | Discharge status: Home / Self Care | Payer: Medicare Other | Attending: Internal Medicine | Admitting: Internal Medicine

## 2019-01-25 ENCOUNTER — Emergency Department: Payer: Medicare Other

## 2019-01-25 ENCOUNTER — Other Ambulatory Visit: Payer: Self-pay

## 2019-01-25 DIAGNOSIS — I132 Hypertensive heart and chronic kidney disease with heart failure and with stage 5 chronic kidney disease, or end stage renal disease: Secondary | ICD-10-CM | POA: Diagnosis present

## 2019-01-25 DIAGNOSIS — N2581 Secondary hyperparathyroidism of renal origin: Secondary | ICD-10-CM | POA: Diagnosis present

## 2019-01-25 DIAGNOSIS — Z992 Dependence on renal dialysis: Secondary | ICD-10-CM

## 2019-01-25 DIAGNOSIS — M1990 Unspecified osteoarthritis, unspecified site: Secondary | ICD-10-CM | POA: Diagnosis present

## 2019-01-25 DIAGNOSIS — I509 Heart failure, unspecified: Secondary | ICD-10-CM | POA: Diagnosis present

## 2019-01-25 DIAGNOSIS — J440 Chronic obstructive pulmonary disease with acute lower respiratory infection: Secondary | ICD-10-CM | POA: Diagnosis present

## 2019-01-25 DIAGNOSIS — N186 End stage renal disease: Secondary | ICD-10-CM | POA: Diagnosis present

## 2019-01-25 DIAGNOSIS — E785 Hyperlipidemia, unspecified: Secondary | ICD-10-CM | POA: Diagnosis present

## 2019-01-25 DIAGNOSIS — E1122 Type 2 diabetes mellitus with diabetic chronic kidney disease: Secondary | ICD-10-CM | POA: Diagnosis present

## 2019-01-25 DIAGNOSIS — J189 Pneumonia, unspecified organism: Secondary | ICD-10-CM

## 2019-01-25 DIAGNOSIS — J159 Unspecified bacterial pneumonia: Secondary | ICD-10-CM | POA: Diagnosis not present

## 2019-01-25 DIAGNOSIS — D631 Anemia in chronic kidney disease: Secondary | ICD-10-CM | POA: Diagnosis present

## 2019-01-25 DIAGNOSIS — Z79899 Other long term (current) drug therapy: Secondary | ICD-10-CM

## 2019-01-25 DIAGNOSIS — Z8249 Family history of ischemic heart disease and other diseases of the circulatory system: Secondary | ICD-10-CM

## 2019-01-25 DIAGNOSIS — Y95 Nosocomial condition: Secondary | ICD-10-CM | POA: Diagnosis present

## 2019-01-25 DIAGNOSIS — F17210 Nicotine dependence, cigarettes, uncomplicated: Secondary | ICD-10-CM | POA: Diagnosis present

## 2019-01-25 DIAGNOSIS — Z79891 Long term (current) use of opiate analgesic: Secondary | ICD-10-CM

## 2019-01-25 DIAGNOSIS — J9601 Acute respiratory failure with hypoxia: Secondary | ICD-10-CM | POA: Diagnosis present

## 2019-01-25 DIAGNOSIS — Z7982 Long term (current) use of aspirin: Secondary | ICD-10-CM

## 2019-01-25 DIAGNOSIS — Z716 Tobacco abuse counseling: Secondary | ICD-10-CM

## 2019-01-25 HISTORY — DX: Dependence on renal dialysis: Z99.2

## 2019-01-25 LAB — COMPREHENSIVE METABOLIC PANEL
ALT: 19 U/L (ref 0–44)
ANION GAP: 12 (ref 5–15)
AST: 23 U/L (ref 15–41)
Albumin: 4.2 g/dL (ref 3.5–5.0)
Alkaline Phosphatase: 68 U/L (ref 38–126)
BILIRUBIN TOTAL: 0.8 mg/dL (ref 0.3–1.2)
BUN: 31 mg/dL — ABNORMAL HIGH (ref 8–23)
CHLORIDE: 96 mmol/L — AB (ref 98–111)
CO2: 30 mmol/L (ref 22–32)
Calcium: 8.8 mg/dL — ABNORMAL LOW (ref 8.9–10.3)
Creatinine, Ser: 7.01 mg/dL — ABNORMAL HIGH (ref 0.61–1.24)
GFR calc Af Amer: 8 mL/min — ABNORMAL LOW (ref >60)
GFR calc non Af Amer: 7 mL/min — ABNORMAL LOW (ref >60)
Glucose, Bld: 91 mg/dL (ref 70–99)
POTASSIUM: 4.4 mmol/L (ref 3.5–5.1)
Sodium: 138 mmol/L (ref 135–145)
Total Protein: 8.2 g/dL — ABNORMAL HIGH (ref 6.5–8.1)

## 2019-01-25 LAB — INFLUENZA PANEL BY PCR (TYPE A & B)
INFLAPCR: NEGATIVE
INFLBPCR: NEGATIVE

## 2019-01-25 LAB — CBC
HEMATOCRIT: 36.8 % — AB (ref 39.0–52.0)
Hemoglobin: 11.9 g/dL — ABNORMAL LOW (ref 13.0–17.0)
MCH: 30.8 pg (ref 26.0–34.0)
MCHC: 32.3 g/dL (ref 30.0–36.0)
MCV: 95.3 fL (ref 80.0–100.0)
NRBC: 0 % (ref 0.0–0.2)
PLATELETS: 173 10*3/uL (ref 150–400)
RBC: 3.86 MIL/uL — ABNORMAL LOW (ref 4.22–5.81)
RDW: 13.5 % (ref 11.5–15.5)
WBC: 9.2 10*3/uL (ref 4.0–10.5)

## 2019-01-25 LAB — TROPONIN I: TROPONIN I: 0.04 ng/mL — AB (ref <0.03)

## 2019-01-25 LAB — BRAIN NATRIURETIC PEPTIDE: B NATRIURETIC PEPTIDE 5: 512 pg/mL — AB (ref 0.0–100.0)

## 2019-01-25 MED ORDER — LEVOFLOXACIN IN D5W 500 MG/100ML IV SOLN
500.0000 mg | Freq: Once | INTRAVENOUS | Status: AC
  Administered 2019-01-25: 500 mg via INTRAVENOUS
  Filled 2019-01-25: qty 100

## 2019-01-25 MED ORDER — METHYLPREDNISOLONE SODIUM SUCC 125 MG IJ SOLR
60.0000 mg | INTRAMUSCULAR | Status: DC
  Administered 2019-01-25 – 2019-01-26 (×2): 60 mg via INTRAVENOUS
  Filled 2019-01-25 (×2): qty 2

## 2019-01-25 MED ORDER — ACETAMINOPHEN 325 MG PO TABS
650.0000 mg | ORAL_TABLET | Freq: Four times a day (QID) | ORAL | Status: DC | PRN
  Administered 2019-01-26: 650 mg via ORAL
  Filled 2019-01-25: qty 2

## 2019-01-25 MED ORDER — ACETAMINOPHEN 650 MG RE SUPP: 650.0000 mg | Freq: Four times a day (QID) | RECTAL | Status: DC | PRN

## 2019-01-25 MED ORDER — IPRATROPIUM-ALBUTEROL 0.5-2.5 (3) MG/3ML IN SOLN
3.0000 mL | Freq: Once | RESPIRATORY_TRACT | Status: AC
  Administered 2019-01-25: 3 mL via RESPIRATORY_TRACT
  Filled 2019-01-25: qty 3

## 2019-01-25 MED ORDER — IPRATROPIUM-ALBUTEROL 0.5-2.5 (3) MG/3ML IN SOLN
3.0000 mL | Freq: Four times a day (QID) | RESPIRATORY_TRACT | Status: DC
  Administered 2019-01-25 – 2019-01-26 (×4): 3 mL via RESPIRATORY_TRACT
  Filled 2019-01-25 (×4): qty 3

## 2019-01-25 MED ORDER — ACETAMINOPHEN 325 MG PO TABS
650.0000 mg | ORAL_TABLET | Freq: Once | ORAL | Status: AC
  Administered 2019-01-25: 650 mg via ORAL
  Filled 2019-01-25: qty 2

## 2019-01-25 MED ORDER — ONDANSETRON HCL 4 MG PO TABS: 4.0000 mg | ORAL_TABLET | Freq: Four times a day (QID) | ORAL | Status: DC | PRN

## 2019-01-25 MED ORDER — HYDRALAZINE HCL 20 MG/ML IJ SOLN
10.0000 mg | Freq: Four times a day (QID) | INTRAMUSCULAR | Status: DC | PRN
  Administered 2019-01-25: 10 mg via INTRAVENOUS
  Filled 2019-01-25: qty 1

## 2019-01-25 MED ORDER — ONDANSETRON HCL 4 MG/2ML IJ SOLN: 4.0000 mg | Freq: Four times a day (QID) | INTRAMUSCULAR | Status: DC | PRN

## 2019-01-25 MED ORDER — POLYETHYLENE GLYCOL 3350 17 G PO PACK: 17.0000 g | PACK | Freq: Every day | ORAL | Status: DC | PRN

## 2019-01-25 MED ORDER — HEPARIN SODIUM (PORCINE) 5000 UNIT/ML IJ SOLN
5000.0000 [IU] | Freq: Three times a day (TID) | INTRAMUSCULAR | Status: DC
  Administered 2019-01-26 – 2019-01-27 (×4): 5000 [IU] via SUBCUTANEOUS
  Filled 2019-01-25 (×5): qty 1

## 2019-01-25 MED ORDER — ALBUTEROL SULFATE (2.5 MG/3ML) 0.083% IN NEBU: 2.5000 mg | INHALATION_SOLUTION | RESPIRATORY_TRACT | Status: DC | PRN

## 2019-01-25 MED ORDER — LEVOFLOXACIN 500 MG PO TABS: 500.0000 mg | ORAL_TABLET | ORAL | Status: DC

## 2019-01-25 NOTE — ED Notes (Signed)
Pt transported to room 229 

## 2019-01-25 NOTE — ED Provider Notes (Signed)
[facility] Emergency Department Provider Note  ____________________________________________  Time seen: Approximately 1:30 PM  I have reviewed the triage vital signs and the nursing notes.   HISTORY  Chief Complaint No chief complaint on file.    HPI Brian Fleming is a 73 y.o. male with PMH CHF, ESRD on dialysis, HTN that presents emergency department for evaluation of weakness, fatigue, and cough for 1 week.  Patient states that he just does not feel well.  Patient went to dialysis today. He does not make urine. No headache, shortness breath, chest pain, vomiting, abdominal pain.   Past Medical History:  Diagnosis Date  . CHF (congestive heart failure) ([place])   . Dialysis patient ([place])   . DJD (degenerative joint disease)   . End stage renal disease on dialysis ([place])   . Hyperlipidemia   . Hypertension     Patient Active Problem List   Diagnosis Date Noted  . Pneumonia 01/25/2019  . End stage renal disease ([place]) 12/04/2017  . Essential hypertension 12/04/2017  . Hyperlipidemia 12/04/2017  . Generalized weakness 04/11/2016  . CHF (congestive heart failure) ([place]) 08/20/2015    Past Surgical History:  Procedure Laterality Date  . AV FISTULA PLACEMENT    . BACK SURGERY      Prior to Admission medications   Medication Sig Start Date End Date Taking? Authorizing Provider  amLODipine (NORVASC) 10 MG tablet Take 10 mg by mouth at bedtime. 04/29/14  Yes [provider]  aspirin EC 81 MG tablet Take 1 tablet (81 mg total) by mouth daily. 04/12/16  Yes Demetrios Loll, MD  B Complex-C-Folic Acid (RENA-VITE PO) Take 1 tablet by mouth daily. 10/27/14  Yes [provider]  gabapentin (NEURONTIN) 300 MG capsule Take 300 mg by mouth daily.    Yes [provider]  ramipril (ALTACE) 10 MG capsule Take 10 mg by mouth daily. 09/09/18  Yes [provider]  ranitidine (ZANTAC) 150 MG capsule Take 150 mg by mouth 2 (two) times daily.  06/25/17  Yes [provider]  sevelamer carbonate (RENVELA) 800 MG tablet Take 2,400 mg by mouth 3 (three) times daily. 06/08/18  Yes [provider]  traMADol (ULTRAM) 50 MG tablet Take 100 mg by mouth 4 (four) times daily.    Yes [provider]  atorvastatin (LIPITOR) 40 MG tablet Take 1 tablet (40 mg total) by mouth daily at 6 PM. Patient not taking: Reported on 02/22/2017 04/12/16   Demetrios Loll, MD  calcium carbonate (TUMS - DOSED IN MG ELEMENTAL CALCIUM) 500 MG chewable tablet Chew 2 tablets (400 mg of elemental calcium total) by mouth 3 (three) times daily. 08/22/15   Epifanio Lesches, MD  etodolac (LODINE) 200 MG capsule Take 1 capsule (200 mg total) by mouth every 8 (eight) hours. Patient not taking: Reported on 02/22/2017 01/27/17   Loney Hering, MD  hydrALAZINE (APRESOLINE) 25 MG tablet Take 1 tablet (25 mg total) by mouth 3 (three) times daily. Patient not taking: Reported on 01/25/2019 08/22/15   Epifanio Lesches, MD    Allergies Patient has no known allergies.  Family History  Problem Relation Age of Onset  . Hypertension Brother     Social History Social History   Tobacco Use  . Smoking status: Current Every Day Smoker    Packs/day: 0.25    Types: Cigarettes  . Smokeless tobacco: Never Used  Substance Use Topics  . Alcohol use: No  . Drug use: No     Review of  Systems  Constitutional: No fever/chills ENT: No upper respiratory complaints. Cardiovascular: No chest pain. Respiratory: Positive for cough. No SOB. Gastrointestinal: No abdominal pain.  No nausea, no vomiting.  Genitourinary: Negative for dysuria. Musculoskeletal: Negative for musculoskeletal pain. Skin: Negative for rash, abrasions, lacerations, ecchymosis. Neurological: Negative for headaches   ____________________________________________   PHYSICAL EXAM:  VITAL SIGNS: ED Triage Vitals  Enc Vitals Group     BP 01/25/19 1256 (!) 160/90     Pulse Rate 01/25/19  1256 74     Resp 01/25/19 1256 20     Temp 01/25/19 1256 99.6 F (37.6 C)     Temp Source 01/25/19 1256 Oral     SpO2 01/25/19 1256 93 %     Weight 01/25/19 1302 219 lb (99.3 kg)     Height 01/25/19 1302 5\' 8"  (1.727 m)     Head Circumference --      Peak Flow --      Pain Score --      Pain Loc --      Pain Edu? --      Excl. in [place]? --      Constitutional: Alert and oriented. Well appearing and in no acute distress. Eyes: Conjunctivae are normal.  Head: Atraumatic. ENT:      Ears:      Nose: No congestion/rhinnorhea.      Mouth/Throat: Mucous membranes are moist.  Neck: No stridor.   Cardiovascular: Normal rate, regular rhythm.  Good peripheral circulation. Respiratory: Normal respiratory effort without tachypnea or retractions. Lungs CTAB. Good air entry to the bases with no decreased or absent breath sounds. Gastrointestinal: Bowel sounds 4 quadrants. Soft and nontender to palpation. No guarding or rigidity. No palpable masses. No distention.  Musculoskeletal: Full range of motion to all extremities. No gross deformities appreciated. Neurologic:  Normal speech and language. No gross focal neurologic deficits are appreciated.  Skin:  Skin is warm, dry and intact. No rash noted. Psychiatric: Mood and affect are normal. Speech and behavior are normal. Patient exhibits appropriate insight and judgement.   ____________________________________________   LABS (all labs ordered are listed, but only abnormal results are displayed)  Labs Reviewed  CBC - Abnormal; Notable for the following components:      Result Value   RBC 3.86 (*)    Hemoglobin 11.9 (*)    HCT 36.8 (*)    All other components within normal limits  COMPREHENSIVE METABOLIC PANEL - Abnormal; Notable for the following components:   Chloride 96 (*)    BUN 31 (*)    Creatinine, Ser 7.01 (*)    Calcium 8.8 (*)    Total Protein 8.2 (*)    GFR calc non Af Amer 7 (*)    GFR calc Af Amer 8 (*)    All other  components within normal limits  TROPONIN I - Abnormal; Notable for the following components:   Troponin I 0.04 (*)    All other components within normal limits  BRAIN NATRIURETIC PEPTIDE - Abnormal; Notable for the following components:   B Natriuretic Peptide 512.0 (*)    All other components within normal limits  BASIC METABOLIC PANEL - Abnormal; Notable for the following components:   Potassium 5.2 (*)    Glucose, Bld 156 (*)    BUN 43 (*)    Creatinine, Ser 8.75 (*)    Calcium 8.5 (*)    GFR calc non Af Amer 6 (*)    GFR calc Af Amer 6 (*)  All other components within normal limits  CBC - Abnormal; Notable for the following components:   RBC 3.67 (*)    Hemoglobin 11.2 (*)    HCT 34.7 (*)    All other components within normal limits  EXPECTORATED SPUTUM ASSESSMENT W REFEX TO RESP CULTURE  INFLUENZA PANEL BY PCR (TYPE A & B)  HIV ANTIBODY (ROUTINE TESTING W REFLEX)  STREP PNEUMONIAE URINARY ANTIGEN   ____________________________________________  EKG  Aflutter  ____________________________________________  RADIOLOGY   Dg Chest 2 View  Result Date: 01/25/2019 CLINICAL DATA:  Cough. EXAM: CHEST - 2 VIEW COMPARISON:  Radiographs of February 22, 2017. FINDINGS: Stable cardiomegaly. Atherosclerosis of thoracic aorta is noted. Both lungs are clear. The visualized skeletal structures are unremarkable. IMPRESSION: No active cardiopulmonary disease. Aortic Atherosclerosis (ICD10-I70.0). Electronically Signed   By: Marijo Conception, M.D.   On: 01/25/2019 13:34   Ct Chest Wo Contrast  Result Date: 01/25/2019 CLINICAL DATA:  73 year old male with weakness and cough. Dialysis patient. EXAM: CT CHEST WITHOUT CONTRAST TECHNIQUE: Multidetector CT imaging of the chest was performed following the standard protocol without IV contrast. COMPARISON:  Chest radiographs earlier today, 02/22/2017. CT Abdomen and Pelvis 01/27/2017. chest CT 05/14/2009 FINDINGS: Cardiovascular: Extensive calcified  coronary artery atherosclerosis and/or stents. Mild to moderate Calcified aortic atherosclerosis. Vascular patency is not evaluated in the absence of IV contrast. Mild-to-moderate cardiomegaly has not significantly changed since 2018. No pericardial effusion. Mediastinum/Nodes: Chronic calcified right hilar lymph nodes. Chronic prominent right paratracheal and anterior carina lymph nodes individually up to 14 millimeters short axis are stable since 2010. Similar mildly enlarged 18 millimeter subcarinal node is stable. Lungs/Pleura: Mild respiratory motion. Major airways are patent but there is some central peribronchial thickening about the carina. There is bilateral lower lobe abnormal pulmonary opacity consisting of solid and sub solid peribronchial nodularity and right greater than left posterior basal segment lower lobe consolidation. On the right there is a small underlying chronic calcified granuloma in the posterior basal segment. No superimposed pleural effusion. The other bilateral pulmonary lobes appear spared. Upper Abdomen: Negative visible noncontrast liver, spleen, stomach and small bowel. Diverticulosis of the splenic flexure. Extensive upper abdominal calcified atherosclerosis. Musculoskeletal: Chronic abnormal bony sclerosis probably is renal osteodystrophy. Moderate T9-T10 endplate irregularity with intervening disc space loss has occurred since 2018, but there is increased intervening vacuum disc. No paraspinal soft tissue inflammatory stranding. Respiratory motion artifact at the sternum. No acute osseous abnormality identified. IMPRESSION: 1. Bilateral lower lobe pneumonia.  No pleural effusion. 2. Mild chronic mediastinal lymphadenopathy, stable since 2010, likely postinflammatory and benign. 3. Chronic cardiomegaly.  No pericardial effusion. 4. 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 5. Advanced calcified atherosclerosis  throughout the chest and visible upper abdomen. Electronically Signed   By: Genevie Ann M.D.   On: 01/25/2019 16:11    ____________________________________________    PROCEDURES  Procedure(s) performed:    Procedures     ____________________________________________   INITIAL IMPRESSION / ASSESSMENT AND PLAN / ED COURSE  Pertinent labs & imaging results that were available during my care of the patient were reviewed by me and considered in my medical decision making (see chart for details).  Review of the  [place]RS was performed in accordance of the [place] prior to dispensing any controlled drugs.     Patient presented to emergency department for evaluation of weakness and cough for 1 week.  CT chest consistent with bilateral lower lobe pneumonia.  Patient  has multiple comorbidities and will be admitted for treatment. Report was given to Dr. Johney Frame.      ____________________________________________  FINAL CLINICAL IMPRESSION(S) / ED DIAGNOSES  Final diagnoses:  Community acquired pneumonia, unspecified laterality      NEW MEDICATIONS STARTED DURING THIS VISIT:  ED Discharge Orders    None          This chart was dictated using voice recognition software/Dragon. Despite best efforts to proofread, errors can occur which can change the meaning. Any change was purely unintentional.    Laban Emperor, PA-C 01/26/19 [id number]    Lavonia Drafts, MD 01/29/19 [phone]

## 2019-01-25 NOTE — Progress Notes (Signed)
Pharmacy Antibiotic Note  Mikko Lewellen is a 73 y.o. male admitted on 01/25/2019 with COPD.  Pharmacy has been consulted for levofloxacin dosing.  Plan: Patient received levofloxacin 500 mg q48H due to patient being on HD.    Height: 5\' 8"  (172.7 cm) Weight: 219 lb (99.3 kg) IBW/kg (Calculated) : 68.4  Temp (24hrs), Avg:99.6 F (37.6 C), Min:99.2 F (37.3 C), Max:100 F (37.8 C)  Recent Labs  Lab 01/25/19 1341  WBC 9.2  CREATININE 7.01*    Estimated Creatinine Clearance: 11.2 mL/min (A) (by C-G formula based on SCr of 7.01 mg/dL (H)).    No Known Allergies  Antimicrobials this admission: 2/3 levofloxacin >>   Dose adjustments this admission: HD  Microbiology results  Thank you for allowing pharmacy to be a part of this patient's care.  Oswald Hillock, PharmD, BCPS Clinical Pharmacist 01/25/2019 5:58 PM

## 2019-01-25 NOTE — ED Provider Notes (Signed)
Seen in conjunction with Kerin Salen, patient multiple comorbidities with weakness, borderline temperature, shortness of breath and cough.  Chest x-ray unremarkable, CT chest demonstrates bibasilar pneumonia will require admission   Lavonia Drafts, MD 01/25/19 1620

## 2019-01-25 NOTE — Progress Notes (Signed)
Advance care planning  Purpose of Encounter Pneumonia  Parties in Attendance Patient  Patients Decisional capacity No change oriented.  Able to make medical decisions.  No documented healthcare power of attorney.  He tells me his Sister Brian Fleming would make decisions if he is unable to.  We discussed regarding patient's pneumonia, need for admission and antibiotics.  CODE STATUS discussed and patient wishes to be a full code.   Orders entered  Time spent - 17 minutes

## 2019-01-25 NOTE — H&P (Signed)
[facility] [place] NAME: Brian Fleming    MR#:  [id number]  DATE OF BIRTH:  06-Nov-1946  DATE OF ADMISSION:  01/25/2019  PRIMARY CARE PHYSICIAN: Marden Noble, MD   REQUESTING/REFERRING PHYSICIAN: Dr. Corky Downs  CHIEF COMPLAINT:  No chief complaint on file.  SOB, cough and fever  HISTORY OF PRESENT ILLNESS:  Brian Fleming  is a 73 y.o. male with a known history of ESRD, HTN, COPD, TObacco use here 1 week of low-grade fevers, cough and shortness of breath.  In the emergency room patient saturations are 92% on room air.  Chest x-ray not show anything acute so a CT scan of the chest was done which shows bilateral pneumonia.  Patient has been given a dose of Levaquin.  At this time he will be admitted under observation.  Patient had dialysis earlier today.  No signs of fluid overload.   PAST MEDICAL HISTORY:   Past Medical History:  Diagnosis Date  . CHF (congestive heart failure) ([place])   . Dialysis patient ([place])   . DJD (degenerative joint disease)   . End stage renal disease on dialysis ([place])   . Hyperlipidemia   . Hypertension     PAST SURGICAL HISTORY:   Past Surgical History:  Procedure Laterality Date  . AV FISTULA PLACEMENT    . BACK SURGERY      SOCIAL HISTORY:   Social History   Tobacco Use  . Smoking status: Current Every Day Smoker    Packs/day: 0.25    Types: Cigarettes  Substance Use Topics  . Alcohol use: No    FAMILY HISTORY:   Family History  Problem Relation Age of Onset  . Hypertension Brother     DRUG ALLERGIES:  No Known Allergies  REVIEW OF SYSTEMS:   Review of Systems  Constitutional: Positive for malaise/fatigue. Negative for chills, fever and weight loss.  HENT: Negative for hearing loss and nosebleeds.   Eyes: Negative for blurred vision, double vision and pain.  Respiratory: Positive for cough and shortness of breath. Negative for hemoptysis, sputum production and wheezing.    Cardiovascular: Negative for chest pain, palpitations, orthopnea and leg swelling.  Gastrointestinal: Negative for abdominal pain, constipation, diarrhea, nausea and vomiting.  Genitourinary: Negative for dysuria and hematuria.  Musculoskeletal: Negative for back pain, falls and myalgias.  Skin: Negative for rash.  Neurological: Negative for dizziness, tremors, sensory change, speech change, focal weakness, seizures and headaches.  Endo/Heme/Allergies: Does not bruise/bleed easily.  Psychiatric/Behavioral: Negative for depression and memory loss. The patient is not nervous/anxious.    MEDICATIONS AT HOME:   Prior to Admission medications   Medication Sig Start Date End Date Taking? Authorizing Provider  amLODipine (NORVASC) 10 MG tablet Take 10 mg by mouth at bedtime. 04/29/14   [provider]  aspirin EC 81 MG tablet Take 1 tablet (81 mg total) by mouth daily. Patient not taking: Reported on 02/22/2017 04/12/16   Demetrios Loll, MD  atorvastatin (LIPITOR) 40 MG tablet Take 1 tablet (40 mg total) by mouth daily at 6 PM. Patient not taking: Reported on 02/22/2017 04/12/16   Demetrios Loll, MD  calcium carbonate (TUMS - DOSED IN MG ELEMENTAL CALCIUM) 500 MG chewable tablet Chew 2 tablets (400 mg of elemental calcium total) by mouth 3 (three) times daily. 08/22/15   Epifanio Lesches, MD  etodolac (LODINE) 200 MG capsule Take 1 capsule (200 mg total) by mouth every 8 (eight) hours. Patient not taking: Reported on 02/22/2017  01/27/17   Loney Hering, MD  gabapentin (NEURONTIN) 100 MG capsule Take 100 mg by mouth 4 (four) times daily.    [provider]  hydrALAZINE (APRESOLINE) 25 MG tablet Take 1 tablet (25 mg total) by mouth 3 (three) times daily. 08/22/15   Epifanio Lesches, MD  traMADol (ULTRAM) 50 MG tablet Take 100 mg by mouth 4 (four) times daily.     [provider]     VITAL SIGNS:  Blood pressure (!) 181/86, pulse 77, temperature 100 F (37.8 C), temperature  source Oral, resp. rate 20, height 5\' 8"  (1.727 m), weight 99.3 kg, SpO2 98 %.  PHYSICAL EXAMINATION:  Physical Exam  GENERAL:  73 y.o.-year-old patient lying in the bed with no acute distress.  EYES: Pupils equal, round, reactive to light and accommodation. No scleral icterus. Extraocular muscles intact.  HEENT: Head atraumatic, normocephalic. Oropharynx and nasopharynx clear. No oropharyngeal erythema, moist oral mucosa  NECK:  Supple, no jugular venous distention. No thyroid enlargement, no tenderness.  LUNGS: Bilateral wheezing.  Decreased air entry at the bases CARDIOVASCULAR: S1, S2 normal. No murmurs, rubs, or gallops. ABDOMEN: Soft, nontender, nondistended. Bowel sounds present. No organomegaly or mass.  EXTREMITIES: No pedal edema, cyanosis, or clubbing. + 2 pedal & radial pulses b/l.   NEUROLOGIC: Cranial nerves II through XII are intact. No focal Motor or sensory deficits appreciated b/l PSYCHIATRIC: The patient is alert and oriented x 3. Good affect.  SKIN: No obvious rash, lesion, or ulcer.   LABORATORY PANEL:   CBC Recent Labs  Lab 01/25/19 1341  WBC 9.2  HGB 11.9*  HCT 36.8*  PLT 173   ------------------------------------------------------------------------------------------------------------------  Chemistries  Recent Labs  Lab 01/25/19 1341  NA 138  K 4.4  CL 96*  CO2 30  GLUCOSE 91  BUN 31*  CREATININE 7.01*  CALCIUM 8.8*  AST 23  ALT 19  ALKPHOS 68  BILITOT 0.8   ------------------------------------------------------------------------------------------------------------------  Cardiac Enzymes Recent Labs  Lab 01/25/19 1341  TROPONINI 0.04*   ------------------------------------------------------------------------------------------------------------------  RADIOLOGY:  Dg Chest 2 View  Result Date: 01/25/2019 CLINICAL DATA:  Cough. EXAM: CHEST - 2 VIEW COMPARISON:  Radiographs of February 22, 2017. FINDINGS: Stable cardiomegaly. Atherosclerosis  of thoracic aorta is noted. Both lungs are clear. The visualized skeletal structures are unremarkable. IMPRESSION: No active cardiopulmonary disease. Aortic Atherosclerosis (ICD10-I70.0). Electronically Signed   By: Marijo Conception, M.D.   On: 01/25/2019 13:34   Ct Chest Wo Contrast  Result Date: 01/25/2019 CLINICAL DATA:  73 year old male with weakness and cough. Dialysis patient. EXAM: CT CHEST WITHOUT CONTRAST TECHNIQUE: Multidetector CT imaging of the chest was performed following the standard protocol without IV contrast. COMPARISON:  Chest radiographs earlier today, 02/22/2017. CT Abdomen and Pelvis 01/27/2017. chest CT 05/14/2009 FINDINGS: Cardiovascular: Extensive calcified coronary artery atherosclerosis and/or stents. Mild to moderate Calcified aortic atherosclerosis. Vascular patency is not evaluated in the absence of IV contrast. Mild-to-moderate cardiomegaly has not significantly changed since 2018. No pericardial effusion. Mediastinum/Nodes: Chronic calcified right hilar lymph nodes. Chronic prominent right paratracheal and anterior carina lymph nodes individually up to 14 millimeters short axis are stable since 2010. Similar mildly enlarged 18 millimeter subcarinal node is stable. Lungs/Pleura: Mild respiratory motion. Major airways are patent but there is some central peribronchial thickening about the carina. There is bilateral lower lobe abnormal pulmonary opacity consisting of solid and sub solid peribronchial nodularity and right greater than left posterior basal segment lower lobe consolidation. On the right there is a small  underlying chronic calcified granuloma in the posterior basal segment. No superimposed pleural effusion. The other bilateral pulmonary lobes appear spared. Upper Abdomen: Negative visible noncontrast liver, spleen, stomach and small bowel. Diverticulosis of the splenic flexure. Extensive upper abdominal calcified atherosclerosis. Musculoskeletal: Chronic abnormal bony  sclerosis probably is renal osteodystrophy. Moderate T9-T10 endplate irregularity with intervening disc space loss has occurred since 2018, but there is increased intervening vacuum disc. No paraspinal soft tissue inflammatory stranding. Respiratory motion artifact at the sternum. No acute osseous abnormality identified. IMPRESSION: 1. Bilateral lower lobe pneumonia.  No pleural effusion. 2. Mild chronic mediastinal lymphadenopathy, stable since 2010, likely postinflammatory and benign. 3. Chronic cardiomegaly.  No pericardial effusion. 4. 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 5. Advanced calcified atherosclerosis throughout the chest and visible upper abdomen. Electronically Signed   By: Genevie Ann M.D.   On: 01/25/2019 16:11   IMPRESSION AND PLAN:   *Bilateral pneumonia with low-grade fevers of 99.  Not needing oxygen.  Admit under observation due to comorbidities.  We will continue IV Levaquin.  Pneumonia order set. Due to wheezing will start IV steroids and nebulizers.  *End-stage renal disease.  On Monday Wednesday and Friday schedule.  No signs of fluid overload.  Finished his dialysis earlier today.  I expect patient will likely be discharged tomorrow.  Consult nephrology if he continues to stay through Wednesday for hemodialysis.  *Hypertension.  Continue home medications.  IV hydralazine added due to uncontrolled blood pressure.  *Generalized weakness due to pneumonia.  Should improve as infection gets better.  *Tobacco abuse.  Counseled to quit smoking for at least 3 minutes.  DVT prophylaxis with heparin subcutaneous  All the records are reviewed and case discussed with ED provider. Management plans discussed with the patient, family and they are in agreement.  CODE STATUS: Full code  TOTAL TIME TAKING CARE OF THIS PATIENT: 35 minutes.   Leia Alf Terral Cooks M.D on 01/25/2019 at [place] PM  Between 7am to 6pm - Pager  - [phone]  After 6pm go to [email or web address] - password [email or web address] [facility]  Office  [phone]  CC: Primary care physician; Marden Noble, MD  Note: This dictation was prepared with Dragon dictation along with smaller phrase technology. Any transcriptional errors that result from this process are unintentional.

## 2019-01-25 NOTE — ED Triage Notes (Signed)
Presents to ED with weakness and cough via ems  States he just doesn't feel well

## 2019-01-26 DIAGNOSIS — E785 Hyperlipidemia, unspecified: Secondary | ICD-10-CM | POA: Diagnosis present

## 2019-01-26 DIAGNOSIS — Z79891 Long term (current) use of opiate analgesic: Secondary | ICD-10-CM | POA: Diagnosis not present

## 2019-01-26 DIAGNOSIS — J159 Unspecified bacterial pneumonia: Secondary | ICD-10-CM | POA: Diagnosis present

## 2019-01-26 DIAGNOSIS — D631 Anemia in chronic kidney disease: Secondary | ICD-10-CM | POA: Diagnosis present

## 2019-01-26 DIAGNOSIS — Z716 Tobacco abuse counseling: Secondary | ICD-10-CM | POA: Diagnosis not present

## 2019-01-26 DIAGNOSIS — M1990 Unspecified osteoarthritis, unspecified site: Secondary | ICD-10-CM | POA: Diagnosis present

## 2019-01-26 DIAGNOSIS — Z7982 Long term (current) use of aspirin: Secondary | ICD-10-CM | POA: Diagnosis not present

## 2019-01-26 DIAGNOSIS — I509 Heart failure, unspecified: Secondary | ICD-10-CM | POA: Diagnosis present

## 2019-01-26 DIAGNOSIS — Z79899 Other long term (current) drug therapy: Secondary | ICD-10-CM | POA: Diagnosis not present

## 2019-01-26 DIAGNOSIS — Z992 Dependence on renal dialysis: Secondary | ICD-10-CM | POA: Diagnosis not present

## 2019-01-26 DIAGNOSIS — I132 Hypertensive heart and chronic kidney disease with heart failure and with stage 5 chronic kidney disease, or end stage renal disease: Secondary | ICD-10-CM | POA: Diagnosis present

## 2019-01-26 DIAGNOSIS — F17210 Nicotine dependence, cigarettes, uncomplicated: Secondary | ICD-10-CM | POA: Diagnosis present

## 2019-01-26 DIAGNOSIS — J189 Pneumonia, unspecified organism: Secondary | ICD-10-CM | POA: Diagnosis present

## 2019-01-26 DIAGNOSIS — N186 End stage renal disease: Secondary | ICD-10-CM | POA: Diagnosis present

## 2019-01-26 DIAGNOSIS — Y95 Nosocomial condition: Secondary | ICD-10-CM | POA: Diagnosis present

## 2019-01-26 DIAGNOSIS — J440 Chronic obstructive pulmonary disease with acute lower respiratory infection: Secondary | ICD-10-CM | POA: Diagnosis present

## 2019-01-26 DIAGNOSIS — E1122 Type 2 diabetes mellitus with diabetic chronic kidney disease: Secondary | ICD-10-CM | POA: Diagnosis present

## 2019-01-26 DIAGNOSIS — N2581 Secondary hyperparathyroidism of renal origin: Secondary | ICD-10-CM | POA: Diagnosis present

## 2019-01-26 DIAGNOSIS — J9601 Acute respiratory failure with hypoxia: Secondary | ICD-10-CM | POA: Diagnosis present

## 2019-01-26 DIAGNOSIS — Z8249 Family history of ischemic heart disease and other diseases of the circulatory system: Secondary | ICD-10-CM | POA: Diagnosis not present

## 2019-01-26 LAB — BASIC METABOLIC PANEL
Anion gap: 13 (ref 5–15)
BUN: 43 mg/dL — ABNORMAL HIGH (ref 8–23)
CO2: 27 mmol/L (ref 22–32)
Calcium: 8.5 mg/dL — ABNORMAL LOW (ref 8.9–10.3)
Chloride: 98 mmol/L (ref 98–111)
Creatinine, Ser: 8.75 mg/dL — ABNORMAL HIGH (ref 0.61–1.24)
GFR calc non Af Amer: 6 mL/min — ABNORMAL LOW (ref >60)
GFR, EST AFRICAN AMERICAN: 6 mL/min — AB (ref >60)
Glucose, Bld: 156 mg/dL — ABNORMAL HIGH (ref 70–99)
Potassium: 5.2 mmol/L — ABNORMAL HIGH (ref 3.5–5.1)
Sodium: 138 mmol/L (ref 135–145)

## 2019-01-26 LAB — CBC
HCT: 34.7 % — ABNORMAL LOW (ref 39.0–52.0)
HEMOGLOBIN: 11.2 g/dL — AB (ref 13.0–17.0)
MCH: 30.5 pg (ref 26.0–34.0)
MCHC: 32.3 g/dL (ref 30.0–36.0)
MCV: 94.6 fL (ref 80.0–100.0)
NRBC: 0 % (ref 0.0–0.2)
Platelets: 171 10*3/uL (ref 150–400)
RBC: 3.67 MIL/uL — AB (ref 4.22–5.81)
RDW: 13.6 % (ref 11.5–15.5)
WBC: 10.1 10*3/uL (ref 4.0–10.5)

## 2019-01-26 LAB — GLUCOSE, CAPILLARY: Glucose-Capillary: 130 mg/dL — ABNORMAL HIGH (ref 70–99)

## 2019-01-26 LAB — HIV ANTIBODY (ROUTINE TESTING W REFLEX): HIV Screen 4th Generation wRfx: NONREACTIVE

## 2019-01-26 MED ORDER — AMLODIPINE BESYLATE 10 MG PO TABS
10.0000 mg | ORAL_TABLET | Freq: Every day | ORAL | Status: DC
  Administered 2019-01-26: 10 mg via ORAL
  Filled 2019-01-26: qty 1

## 2019-01-26 MED ORDER — ASPIRIN EC 81 MG PO TBEC
81.0000 mg | DELAYED_RELEASE_TABLET | Freq: Every day | ORAL | Status: DC
  Administered 2019-01-26 – 2019-01-27 (×2): 81 mg via ORAL
  Filled 2019-01-26 (×2): qty 1

## 2019-01-26 MED ORDER — INSULIN ASPART 100 UNIT/ML ~~LOC~~ SOLN: 0.0000 [IU] | Freq: Three times a day (TID) | SUBCUTANEOUS | Status: DC

## 2019-01-26 MED ORDER — AZITHROMYCIN 250 MG PO TABS
500.0000 mg | ORAL_TABLET | Freq: Every day | ORAL | Status: DC
  Administered 2019-01-26 – 2019-01-27 (×2): 500 mg via ORAL
  Filled 2019-01-26 (×2): qty 2

## 2019-01-26 MED ORDER — HYDRALAZINE HCL 25 MG PO TABS
25.0000 mg | ORAL_TABLET | Freq: Three times a day (TID) | ORAL | Status: DC
  Administered 2019-01-26 – 2019-01-27 (×2): 25 mg via ORAL
  Filled 2019-01-26 (×2): qty 1

## 2019-01-26 MED ORDER — IPRATROPIUM-ALBUTEROL 0.5-2.5 (3) MG/3ML IN SOLN
3.0000 mL | Freq: Three times a day (TID) | RESPIRATORY_TRACT | Status: DC
  Filled 2019-01-26: qty 3

## 2019-01-26 MED ORDER — SEVELAMER CARBONATE 800 MG PO TABS
2400.0000 mg | ORAL_TABLET | Freq: Three times a day (TID) | ORAL | Status: DC
  Administered 2019-01-26 – 2019-01-27 (×3): 2400 mg via ORAL
  Filled 2019-01-26 (×3): qty 3

## 2019-01-26 MED ORDER — RENA-VITE PO TABS
1.0000 | ORAL_TABLET | Freq: Every day | ORAL | Status: DC
  Administered 2019-01-26: 1 via ORAL
  Filled 2019-01-26: qty 1

## 2019-01-26 MED ORDER — RAMIPRIL 10 MG PO CAPS
10.0000 mg | ORAL_CAPSULE | Freq: Every day | ORAL | Status: DC
  Administered 2019-01-26 – 2019-01-27 (×2): 10 mg via ORAL
  Filled 2019-01-26 (×2): qty 1

## 2019-01-26 MED ORDER — CEFDINIR 300 MG PO CAPS
300.0000 mg | ORAL_CAPSULE | ORAL | Status: DC
  Administered 2019-01-26: 300 mg via ORAL
  Filled 2019-01-26: qty 1

## 2019-01-26 NOTE — Progress Notes (Signed)
[facility] [place] NAME: Brian Fleming    MR#:  [id number]  DATE OF BIRTH:  [birth year]  SUBJECTIVE:  CHIEF COMPLAINT:  No chief complaint on file. Admitted for pneumonia with shortness of breath, cough, fever.  Says he is feeling better than yesterday, still on oxygen, on  2 L of oxygen and sats 100%.  REVIEW OF SYSTEMS:   ROS CONSTITUTIONAL: No fever, fatigue or weakness.  EYES: No blurred or double vision.  EARS, NOSE, AND THROAT: No tinnitus or ear pain.  RESPIRATORY: No cough, shortness of breath, wheezing or hemoptysis.  CARDIOVASCULAR: No chest pain, orthopnea, edema.  GASTROINTESTINAL: No nausea, vomiting, diarrhea or abdominal pain.  GENITOURINARY: No dysuria, hematuria.  ENDOCRINE: No polyuria, nocturia,  HEMATOLOGY: No anemia, easy bruising or bleeding SKIN: No rash or lesion. MUSCULOSKELETAL: No joint pain or arthritis.   NEUROLOGIC: No tingling, numbness, weakness.  PSYCHIATRY: No anxiety or depression.   DRUG ALLERGIES:  No Known Allergies  VITALS:  Blood pressure (!) 158/84, pulse 72, temperature 97.9 F (36.6 C), temperature source Oral, resp. rate 18, height 5\' 8"  (1.727 m), weight 87.5 kg, SpO2 100 %.  PHYSICAL EXAMINATION:  GENERAL:  73 y.o.-year-old patient lying in the bed with no acute distress.  EYES: Pupils equal, round, reactive to light and accommodation. No scleral icterus. Extraocular muscles intact.  HEENT: Head atraumatic, normocephalic. Oropharynx and nasopharynx clear.  NECK:  Supple, no jugular venous distention. No thyroid enlargement, no tenderness.  LUNGS: Normal breath sounds bilaterally, no wheezing, rales,rhonchi or crepitation. No use of accessory muscles of respiration.  CARDIOVASCULAR: S1, S2 normal. No murmurs, rubs, or gallops.  ABDOMEN: Soft, nontender, nondistended. Bowel sounds present. No organomegaly or mass.  EXTREMITIES: No pedal edema, cyanosis, or clubbing.   NEUROLOGIC: Cranial nerves II through XII are intact. Muscle strength 5/5 in all extremities. Sensation intact. Gait not checked.  PSYCHIATRIC: The patient is alert and oriented x 3.  SKIN: No obvious rash, lesion, or ulcer.    LABORATORY PANEL:   CBC Recent Labs  Lab 01/26/19 0511  WBC 10.1  HGB 11.2*  HCT 34.7*  PLT 171   ------------------------------------------------------------------------------------------------------------------  Chemistries  Recent Labs  Lab 01/25/19 1341 01/26/19 0511  NA 138 138  K 4.4 5.2*  CL 96* 98  CO2 30 27  GLUCOSE 91 156*  BUN 31* 43*  CREATININE 7.01* 8.75*  CALCIUM 8.8* 8.5*  AST 23  --   ALT 19  --   ALKPHOS 68  --   BILITOT 0.8  --    ------------------------------------------------------------------------------------------------------------------  Cardiac Enzymes Recent Labs  Lab 01/25/19 1341  TROPONINI 0.04*   ------------------------------------------------------------------------------------------------------------------  RADIOLOGY:  Dg Chest 2 View  Result Date: 01/25/2019 CLINICAL DATA:  Cough. EXAM: CHEST - 2 VIEW COMPARISON:  Radiographs of February 22, 2017. FINDINGS: Stable cardiomegaly. Atherosclerosis of thoracic aorta is noted. Both lungs are clear. The visualized skeletal structures are unremarkable. IMPRESSION: No active cardiopulmonary disease. Aortic Atherosclerosis (ICD10-I70.0). Electronically Signed   By: Marijo Conception, M.D.   On: 01/25/2019 13:34   Ct Chest Wo Contrast  Result Date: 01/25/2019 CLINICAL DATA:  73 year old male with weakness and cough. Dialysis patient. EXAM: CT CHEST WITHOUT CONTRAST TECHNIQUE: Multidetector CT imaging of the chest was performed following the standard protocol without IV contrast. COMPARISON:  Chest radiographs earlier today, 02/22/2017. CT Abdomen and Pelvis 01/27/2017. chest CT 05/14/2009 FINDINGS: Cardiovascular: Extensive calcified coronary artery atherosclerosis and/or  stents. Mild to moderate Calcified  aortic atherosclerosis. Vascular patency is not evaluated in the absence of IV contrast. Mild-to-moderate cardiomegaly has not significantly changed since 2018. No pericardial effusion. Mediastinum/Nodes: Chronic calcified right hilar lymph nodes. Chronic prominent right paratracheal and anterior carina lymph nodes individually up to 14 millimeters short axis are stable since 2010. Similar mildly enlarged 18 millimeter subcarinal node is stable. Lungs/Pleura: Mild respiratory motion. Major airways are patent but there is some central peribronchial thickening about the carina. There is bilateral lower lobe abnormal pulmonary opacity consisting of solid and sub solid peribronchial nodularity and right greater than left posterior basal segment lower lobe consolidation. On the right there is a small underlying chronic calcified granuloma in the posterior basal segment. No superimposed pleural effusion. The other bilateral pulmonary lobes appear spared. Upper Abdomen: Negative visible noncontrast liver, spleen, stomach and small bowel. Diverticulosis of the splenic flexure. Extensive upper abdominal calcified atherosclerosis. Musculoskeletal: Chronic abnormal bony sclerosis probably is renal osteodystrophy. Moderate T9-T10 endplate irregularity with intervening disc space loss has occurred since 2018, but there is increased intervening vacuum disc. No paraspinal soft tissue inflammatory stranding. Respiratory motion artifact at the sternum. No acute osseous abnormality identified. IMPRESSION: 1. Bilateral lower lobe pneumonia.  No pleural effusion. 2. Mild chronic mediastinal lymphadenopathy, stable since 2010, likely postinflammatory and benign. 3. Chronic cardiomegaly.  No pericardial effusion. 4. 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 5. Advanced calcified atherosclerosis throughout the chest and visible upper  abdomen. Electronically Signed   By: Genevie Ann M.D.   On: 01/25/2019 16:11    EKG:   Orders placed or performed during the hospital encounter of 01/25/19  . ED EKG  . ED EKG  . EKG 12-Lead  . EKG 12-Lead    ASSESSMENT AND PLAN:   Respiratory failure with hypoxia secondary to bilateral pneumonia also: Strep pneumonia antigen in the urine, patient oxygen saturation 93% on room air when he came but he is much better today, maintaining 100% saturation on 2 L of oxygen, will wean off oxygen, change to oral antibiotics.  Discontinue IV Levaquin.  Wean off oxygen today and see if he can be discharged home tomorrow. 2.  ESRD on hemodialysis Monday, Wednesday, Friday.,  Likely discharge home tomorrow after hemodialysis.  Consult nephrology. 3.  Diabetes mellitus type 2: Not on meds at home, add sliding scale with coverage. 4.  Essential hypertension: Restart Norvasc, ramipril, hydralazine.  plan is wean off oxygen, consult nephrology for dialysis tomorrow, possible discharge tomorrow.    All the records are reviewed and case discussed with Care Management/Social Workerr. Management plans discussed with the patient, family and they are in agreement.  CODE STATUS: full  TOTAL TIME TAKING CARE OF THIS PATIENT: 25minutes.   POSSIBLE D/C IN 1-2 DAYS, DEPENDING ON CLINICAL CONDITION.  More than 50% time spent in counseling, coordination of care. Epifanio Lesches M.D on 01/26/2019 at 1:22 PM  Between 7am to 6pm - Pager - [phone]  After 6pm go to [email or web address] - password [email or web address] [facility]  Office  [phone]  CC: Primary care physician; Marden Noble, MD   Note: This dictation was prepared with Dragon dictation along with smaller phrase technology. Any transcriptional errors that result from this process are unintentional.

## 2019-01-26 NOTE — Care Management Obs Status (Signed)
[place] NOTIFICATION   Patient Details  Name: Crew Goren MRN: [id number] Date of Birth: [birth year]   Medicare Observation Status Notification Given:  No  Admit  order placed in < 24hr of being placed on observation   Katrina Stack, RN 01/26/2019, 6:10 PM

## 2019-01-26 NOTE — Care Management (Signed)
Presented to the ED with weakness, shortness of breath, fever and cough.  Bilateral pneumonia found on CT. On room air.  Chronic HD M W F. There is nothing that would suggest there are any concerns regarding compliance. Notified Elvera Bicker with [employer]

## 2019-01-27 LAB — PHOSPHORUS: Phosphorus: 6.3 mg/dL — ABNORMAL HIGH (ref 2.5–4.6)

## 2019-01-27 LAB — GLUCOSE, CAPILLARY
GLUCOSE-CAPILLARY: 120 mg/dL — AB (ref 70–99)
Glucose-Capillary: 106 mg/dL — ABNORMAL HIGH (ref 70–99)

## 2019-01-27 MED ORDER — HEPARIN SODIUM (PORCINE) 1000 UNIT/ML DIALYSIS
1000.0000 [IU] | INTRAMUSCULAR | Status: DC | PRN
  Filled 2019-01-27: qty 1

## 2019-01-27 MED ORDER — AZITHROMYCIN 250 MG PO TABS: ORAL_TABLET | ORAL | 0 refills | Status: AC

## 2019-01-27 MED ORDER — LIDOCAINE-PRILOCAINE 2.5-2.5 % EX CREA
1.0000 "application " | TOPICAL_CREAM | CUTANEOUS | Status: DC | PRN
  Filled 2019-01-27: qty 5

## 2019-01-27 MED ORDER — SODIUM CHLORIDE 0.9 % IV SOLN: 100.0000 mL | INTRAVENOUS | Status: DC | PRN

## 2019-01-27 MED ORDER — CHLORHEXIDINE GLUCONATE CLOTH 2 % EX PADS
6.0000 | MEDICATED_PAD | Freq: Every day | CUTANEOUS | Status: DC
  Administered 2019-01-27: 6 via TOPICAL

## 2019-01-27 MED ORDER — PREDNISONE 10 MG (21) PO TBPK: ORAL_TABLET | ORAL | 0 refills | Status: DC

## 2019-01-27 MED ORDER — LIDOCAINE HCL (PF) 1 % IJ SOLN
5.0000 mL | INTRAMUSCULAR | Status: DC | PRN
  Filled 2019-01-27: qty 5

## 2019-01-27 MED ORDER — CEFDINIR 300 MG PO CAPS: 300.0000 mg | ORAL_CAPSULE | ORAL | 0 refills | Status: DC

## 2019-01-27 MED ORDER — ALTEPLASE 2 MG IJ SOLR: 2.0000 mg | Freq: Once | INTRAMUSCULAR | Status: DC | PRN

## 2019-01-27 MED ORDER — PENTAFLUOROPROP-TETRAFLUOROETH EX AERO
1.0000 "application " | INHALATION_SPRAY | CUTANEOUS | Status: DC | PRN
  Filled 2019-01-27: qty 30

## 2019-01-27 NOTE — Progress Notes (Signed)
HD tx start    01/27/19 0950  Vital Signs  Pulse Rate 73  Pulse Rate Source Monitor  Resp 16  BP (!) 142/74  BP Location Right Arm  BP Method Automatic  Patient Position (if appropriate) Lying  Oxygen Therapy  SpO2 97 %  O2 Device Room Air  During Hemodialysis Assessment  Blood Flow Rate (mL/min) 400 mL/min  Arterial Pressure (mmHg) -110 mmHg  Venous Pressure (mmHg) 130 mmHg  Transmembrane Pressure (mmHg) 80 mmHg  Ultrafiltration Rate (mL/min) 570 mL/min  Dialysate Flow Rate (mL/min) 800 ml/min  Conductivity: Machine  13.9  HD Safety Checks Performed Yes  Dialysis Fluid Bolus Normal Saline  Bolus Amount (mL) 250 mL  Intra-Hemodialysis Comments Tx initiated

## 2019-01-27 NOTE — Progress Notes (Signed)
[facility]  ROUNDING NOTE   Subjective:  Patient well-known to [place] from prior admissions. Completed dialysis today. Currently being treated for pneumonia.   Objective:  Vital signs in last 24 hours:  Temp:  [97.6 F (36.4 C)-98.1 F (36.7 C)] 97.6 F (36.4 C) (02/05 1335) Pulse Rate:  [70-83] 83 (02/05 1337) Resp:  [13-24] 16 (02/05 1337) BP: (140-171)/(73-90) 162/85 (02/05 1335) SpO2:  [93 %-100 %] 98 % (02/05 1335) Weight:  [89.3 kg-90.3 kg] 89.3 kg (02/05 1335)  Weight change:  Filed Weights   01/26/19 0435 01/27/19 0936 01/27/19 1335  Weight: 87.5 kg 90.3 kg 89.3 kg    Intake/Output: I/O last 3 completed shifts: In: 720 [P.O.:720] Out: 0    Intake/Output this shift:  Total I/O In: -  Out: 1506 [Other:1506]  Physical Exam: General: No acute distress  Head: Normocephalic, atraumatic. Moist oral mucosal membranes  Eyes: Anicteric  Neck: Supple, trachea midline  Lungs:  Basilar rales, normal effort  Heart: S1S2 no rubs  Abdomen:  Soft, nontender, bowel sounds present  Extremities: 1+ peripheral edema.  Neurologic: Awake, alert, following commands  Skin: No lesions  Access: LUE AVF    Basic Metabolic Panel: Recent Labs  Lab 01/25/19 1341 01/26/19 0511 01/27/19 1100  NA 138 138  --   K 4.4 5.2*  --   CL 96* 98  --   CO2 30 27  --   GLUCOSE 91 156*  --   BUN 31* 43*  --   CREATININE 7.01* 8.75*  --   CALCIUM 8.8* 8.5*  --   PHOS  --   --  6.3*    Liver Function Tests: Recent Labs  Lab 01/25/19 1341  AST 23  ALT 19  ALKPHOS 68  BILITOT 0.8  PROT 8.2*  ALBUMIN 4.2   No results for input(s): LIPASE, AMYLASE in the last 168 hours. No results for input(s): AMMONIA in the last 168 hours.  CBC: Recent Labs  Lab 01/25/19 1341 01/26/19 0511  WBC 9.2 10.1  HGB 11.9* 11.2*  HCT 36.8* 34.7*  MCV 95.3 94.6  PLT 173 171    Cardiac Enzymes: Recent Labs  Lab 01/25/19 1341  TROPONINI 0.04*    BNP: Invalid input(s):  POCBNP  CBG: Recent Labs  Lab 01/26/19 2145 01/27/19 0624 01/27/19 0756  GLUCAP 130* 120* 106*    Microbiology: Results for orders placed or performed during the hospital encounter of 04/11/16  MRSA PCR Screening     Status: None   Collection Time: 04/12/16  5:32 AM  Result Value Ref Range Status   MRSA by PCR NEGATIVE NEGATIVE Final    Comment:        The GeneXpert MRSA Assay (FDA approved for NASAL specimens only), is one component of a comprehensive MRSA colonization surveillance program. It is not intended to diagnose MRSA infection nor to guide or monitor treatment for MRSA infections.     Coagulation Studies: No results for input(s): LABPROT, INR in the last 72 hours.  Urinalysis: No results for input(s): COLORURINE, LABSPEC, PHURINE, GLUCOSEU, HGBUR, BILIRUBINUR, KETONESUR, PROTEINUR, UROBILINOGEN, NITRITE, LEUKOCYTESUR in the last 72 hours.  Invalid input(s): APPERANCEUR    Imaging: Ct Chest Wo Contrast  Result Date: 01/25/2019 CLINICAL DATA:  73 year old male with weakness and cough. Dialysis patient. EXAM: CT CHEST WITHOUT CONTRAST TECHNIQUE: Multidetector CT imaging of the chest was performed following the standard protocol without IV contrast. COMPARISON:  Chest radiographs earlier today, 02/22/2017. CT Abdomen and Pelvis 01/27/2017. chest CT  05/14/2009 FINDINGS: Cardiovascular: Extensive calcified coronary artery atherosclerosis and/or stents. Mild to moderate Calcified aortic atherosclerosis. Vascular patency is not evaluated in the absence of IV contrast. Mild-to-moderate cardiomegaly has not significantly changed since 2018. No pericardial effusion. Mediastinum/Nodes: Chronic calcified right hilar lymph nodes. Chronic prominent right paratracheal and anterior carina lymph nodes individually up to 14 millimeters short axis are stable since 2010. Similar mildly enlarged 18 millimeter subcarinal node is stable. Lungs/Pleura: Mild respiratory motion. Major airways  are patent but there is some central peribronchial thickening about the carina. There is bilateral lower lobe abnormal pulmonary opacity consisting of solid and sub solid peribronchial nodularity and right greater than left posterior basal segment lower lobe consolidation. On the right there is a small underlying chronic calcified granuloma in the posterior basal segment. No superimposed pleural effusion. The other bilateral pulmonary lobes appear spared. Upper Abdomen: Negative visible noncontrast liver, spleen, stomach and small bowel. Diverticulosis of the splenic flexure. Extensive upper abdominal calcified atherosclerosis. Musculoskeletal: Chronic abnormal bony sclerosis probably is renal osteodystrophy. Moderate T9-T10 endplate irregularity with intervening disc space loss has occurred since 2018, but there is increased intervening vacuum disc. No paraspinal soft tissue inflammatory stranding. Respiratory motion artifact at the sternum. No acute osseous abnormality identified. IMPRESSION: 1. Bilateral lower lobe pneumonia.  No pleural effusion. 2. Mild chronic mediastinal lymphadenopathy, stable since 2010, likely postinflammatory and benign. 3. Chronic cardiomegaly.  No pericardial effusion. 4. 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 5. Advanced calcified atherosclerosis throughout the chest and visible upper abdomen. Electronically Signed   By: Genevie Ann M.D.   On: 01/25/2019 16:11     Medications:    . amLODipine  10 mg Oral QHS  . aspirin EC  81 mg Oral Daily  . azithromycin  500 mg Oral Daily  . cefdinir  300 mg Oral QODAY  . Chlorhexidine Gluconate Cloth  6 each Topical Q0600  . heparin  5,000 Units Subcutaneous Q8H  . hydrALAZINE  25 mg Oral Q8H  . insulin aspart  0-9 Units Subcutaneous TID WC  . methylPREDNISolone (SOLU-MEDROL) injection  60 mg Intravenous Q24H  . multivitamin  1 tablet Oral QHS  . ramipril  10 mg  Oral Daily  . sevelamer carbonate  2,400 mg Oral TID   acetaminophen **OR** acetaminophen, albuterol, hydrALAZINE, ondansetron **OR** ondansetron (ZOFRAN) IV, polyethylene glycol  Assessment/ Plan:  73 y.o. male with end-stage renal disease, hypertension, hyperlipidemia, degenerative disc disease, anemia chronic kidney disease, secondary hyperparathyroidism, admitted now for pneumonia.  [facility]/[address]/MWF  1.  ESRD on HD MWF.  Patient did complete hemodialysis today.  We will plan for dialysis again on Friday.  2.  Bacterial pneumonia.  Patient being treated with Cefdinir and azithromycin.  3.  Anemia chronic kidney disease.  Hemoglobin currently 11.2.  No indication for Epogen at this time.  4.  Secondary hyperparathyroidism.  Phosphorus: 6.3.  Maintain the patient on Renvela 3 tablets by mouth 3 times a day with meals.   LOS: 1 Lavell Supple 2/5/20202:01 PM

## 2019-01-27 NOTE — Progress Notes (Signed)
Discharge patient home.  Discharge on oral antibiotics with azithromycin to 50 mg daily for 4 days, cefdinir for 5 days.

## 2019-01-27 NOTE — Progress Notes (Signed)
Post HD assessment    01/27/19 1334  Neurological  Level of Consciousness Alert  Orientation Level Oriented X4  Respiratory  Respiratory Pattern Regular;Unlabored  Chest Assessment Chest expansion symmetrical  Cough Non-productive  Cardiac  Pulse Regular  ECG Monitor Yes  Cardiac Rhythm NSR  Vascular  R Radial Pulse +2  L Radial Pulse +2  Edema Generalized  Integumentary  Integumentary (WDL) X  Skin Color Appropriate for ethnicity  Musculoskeletal  Musculoskeletal (WDL) X  Generalized Weakness Yes  Assistive Device None  GU Assessment  Genitourinary (WDL) X  Genitourinary Symptoms  (HD)  Psychosocial  Psychosocial (WDL) WDL

## 2019-01-27 NOTE — Discharge Planning (Addendum)
Patient IV removed. RN assessment and VS revealed stability for DC to home.  Discharge papers given, explained and educated. Informed of suggsted FU appt and patinet agreed to contact as needed and to set up appt. (since office appears to be closed for the day). Weened to RA - Patient SPO2 94% on RA with activity.  Scripts escribed to pharmacy per patient request.  Once ready, will be wheeled to front and caregiver transporting home via car.

## 2019-01-27 NOTE — Progress Notes (Signed)
Post HD assessment. Pt tolerated tx well without c/o or complication. Net UF 1506, goal met.    01/27/19 1335  Vital Signs  Temp 97.6 F (36.4 C)  Temp Source Oral  Pulse Rate 83  Pulse Rate Source Monitor  Resp 18  BP (!) 162/85  BP Location Right Arm  BP Method Automatic  Patient Position (if appropriate) Lying  Oxygen Therapy  SpO2 98 %  O2 Device Room Air  Dialysis Weight  Weight 89.3 kg  Type of Weight Post-Dialysis  Post-Hemodialysis Assessment  Rinseback Volume (mL) 250 mL  KECN 79 V  Dialyzer Clearance Lightly streaked  Duration of HD Treatment -hour(s) 3.5 hour(s)  Hemodialysis Intake (mL) 500 mL  UF Total -Machine (mL) 2006 mL  Net UF (mL) 1506 mL  Tolerated HD Treatment Yes  AVG/AVF Arterial Site Held (minutes) 10 minutes  AVG/AVF Venous Site Held (minutes) 10 minutes  Education / Care Plan  Dialysis Education Provided Yes  Documented Education in Care Plan Yes

## 2019-01-27 NOTE — Progress Notes (Signed)
Pre HD assessment    01/27/19 0937  Neurological  Level of Consciousness Alert  Orientation Level Oriented X4  Respiratory  Respiratory Pattern Regular;Unlabored  Chest Assessment Chest expansion symmetrical  Cardiac  Pulse Regular  ECG Monitor Yes  Cardiac Rhythm NSR  Vascular  R Radial Pulse +2  L Radial Pulse +2  Edema Generalized  Integumentary  Integumentary (WDL) X  Skin Color Appropriate for ethnicity  Musculoskeletal  Musculoskeletal (WDL) X  Generalized Weakness Yes  Assistive Device None  GU Assessment  Genitourinary (WDL) X  Genitourinary Symptoms  (HD)  Psychosocial  Psychosocial (WDL) WDL

## 2019-01-27 NOTE — Progress Notes (Signed)
HD tx end    01/27/19 1330  Vital Signs  Pulse Rate 83  Pulse Rate Source Monitor  Resp 20  BP (!) 171/90  BP Location Right Arm  BP Method Automatic  Patient Position (if appropriate) Lying  Oxygen Therapy  SpO2 93 %  O2 Device Room Air  During Hemodialysis Assessment  Dialysis Fluid Bolus Normal Saline  Bolus Amount (mL) 250 mL  Intra-Hemodialysis Comments Tx completed

## 2019-01-27 NOTE — Progress Notes (Signed)
Pre HD assessment    01/27/19 0936  Vital Signs  Temp 98.1 F (36.7 C)  Temp Source Oral  Pulse Rate 74  Pulse Rate Source Monitor  Resp 13  BP (!) 141/76  BP Location Right Arm  BP Method Automatic  Patient Position (if appropriate) Lying  Oxygen Therapy  SpO2 96 %  O2 Device Room Air  Pain Assessment  Pain Scale 0-10  Pain Score 0  Dialysis Weight  Weight 90.3 kg  Type of Weight Pre-Dialysis  Time-Out for Hemodialysis  What Procedure? HD  Pt Identifiers(min of two) First/Last Name;MRN/Account#  Correct Site? Yes  Correct Side? Yes  Correct Procedure? Yes  Consents Verified? Yes  Rad Studies Available? N/A  Safety Precautions Reviewed? Yes  Engineer, civil (consulting) Number  (2A)  Station Number 4  UF/Alarm Test Passed  Conductivity: Meter 14  Conductivity: Machine  14.2  pH 7.4  Reverse Osmosis main  Normal Saline Lot Number [id number]  Dialyzer Lot Number [id number]  Disposable Set Lot Number [id number]  Machine Temperature 98.6 F (37 C)  Musician and Audible Yes  Blood Lines Intact and Secured Yes  Pre Treatment Patient Checks  Vascular access used during treatment Fistula  Hepatitis B Surface Antigen Results Negative  Date Hepatitis B Surface Antigen Drawn 10/05/18  Hepatitis B Surface Antibody 14 (>10)  Date Hepatitis B Surface Antibody Drawn 10/05/18  Hemodialysis Consent Verified Yes  Hemodialysis Standing Orders Initiated Yes  ECG (Telemetry) Monitor On Yes  Prime Ordered Normal Saline  Length of  DialysisTreatment -hour(s) 3.5 Hour(s)  Dialyzer Elisio 17H NR  Dialysate 2K, 2.5 Ca  Dialysate Flow Ordered 800  Blood Flow Rate Ordered 400 mL/min  Ultrafiltration Goal 1.5 Liters  Pre Treatment Labs Phosphorus;Other (Comment) (PTH)  Dialysis Blood Pressure Support Ordered Normal Saline  Education / Care Plan  Dialysis Education Provided Yes  Documented Education in Care Plan Yes

## 2019-01-28 LAB — PARATHYROID HORMONE, INTACT (NO CA): PTH: 395 pg/mL — ABNORMAL HIGH (ref 15–65)

## 2019-01-30 NOTE — Discharge Summary (Signed)
Brian Fleming, is a 73 y.o. male  DOB 11/25/1946  MRN [id number].  Admission date:  01/25/2019  Admitting Physician  Hillary Bow, MD  Discharge Date:  01/27/2019   Primary MD  Marden Noble, MD  Recommendations for primary care physician for things to follow:  Follow-up with PCP in 1 week   Admission Diagnosis  Community acquired pneumonia, unspecified laterality [J18.9]   Discharge Diagnosis  Community acquired pneumonia, unspecified laterality [J18.9]    Active Problems:   Pneumonia   HCAP (healthcare-associated pneumonia)      Past Medical History:  Diagnosis Date  . CHF (congestive heart failure) ([place])   . Dialysis patient ([place])   . DJD (degenerative joint disease)   . End stage renal disease on dialysis ([place])   . Hyperlipidemia   . Hypertension     Past Surgical History:  Procedure Laterality Date  . AV FISTULA PLACEMENT    . BACK SURGERY         History of present illness and  Hospital Course:     Kindly see H&P for history of present illness and admission details, please review complete Labs, Consult reports and Test reports for all details in brief  HPI  from the history and physical done on the day of admission 73 year old male patient with history of ESRD on dialysis, hypertension admitted for shortness of breath, cough and found to have bilateral pneumonia and admitted for the same.   Hospital Course   #1. acute respiratory failure secondary to bilateral pneumonia: Initial O2 sats 91% on room air, admitted to hospital, started on oxygen, IV antibiotics, patient CT chest on admission showed bilateral lower lobe pneumonia, pneumonia pathway activated on admission, patient felt better with bronchodilators, IV antibiotics, discharged home with azithromycin 250 mg for 4 days, cefdinir for 5  days.  Patient also discharged home with prednisone Dosepak. 2.  Essential hypertension: Controlled, continue home medicines 3.  ESRD on hemodialysis, seen by nephrologist, continue hemodialysis as scheduled. Discharge Condition: Stable   Follow UP  Follow-up Information    Marden Noble, MD. Call in 1 week.   Specialty:  Internal Medicine Why:  Unable to reach office, please call for an appointment for a 1 week follow-up Contact information: [address] [place] [address] [phone]             Discharge Instructions  and  Discharge Medications      Allergies as of 01/27/2019   No Known Allergies     Medication List    TAKE these medications   amLODipine 10 MG tablet Commonly known as:  NORVASC Take 10 mg by mouth at bedtime.   aspirin EC 81 MG tablet Take 1 tablet (81 mg total) by mouth daily.   atorvastatin 40 MG tablet Commonly known as:  LIPITOR Take 1 tablet (40 mg total) by mouth daily at 6 PM.   azithromycin 250 MG tablet Commonly known as:  ZITHROMAX Z-PAK Take 2 tablets (500 mg) on  Day 1,  followed by 1 tablet (250 mg) once daily on Days 2 through 5.   calcium carbonate 500 MG chewable tablet Commonly known as:  TUMS - dosed in mg elemental calcium Chew 2 tablets (400 mg of elemental calcium total) by mouth 3 (three) times daily.   cefdinir 300 MG capsule Commonly known as:  OMNICEF Take 1 capsule (300 mg total) by mouth every other day.   etodolac 200 MG capsule Commonly known as:  LODINE Take  1 capsule (200 mg total) by mouth every 8 (eight) hours.   gabapentin 300 MG capsule Commonly known as:  NEURONTIN Take 300 mg by mouth daily.   hydrALAZINE 25 MG tablet Commonly known as:  APRESOLINE Take 1 tablet (25 mg total) by mouth 3 (three) times daily.   predniSONE 10 MG (21) Tbpk tablet Commonly known as:  STERAPRED UNI-PAK 21 TAB Taper by 10 mg daily   ramipril 10 MG capsule Commonly known as:  ALTACE Take 10 mg by mouth  daily.   ranitidine 150 MG capsule Commonly known as:  ZANTAC Take 150 mg by mouth 2 (two) times daily.   RENA-VITE PO Take 1 tablet by mouth daily.   sevelamer carbonate 800 MG tablet Commonly known as:  RENVELA Take 2,400 mg by mouth 3 (three) times daily.   traMADol 50 MG tablet Commonly known as:  ULTRAM Take 100 mg by mouth 4 (four) times daily.         Diet and Activity recommendation: See Discharge Instructions above   Consults obtained -nephrology   Major procedures and Radiology Reports - PLEASE review detailed and final reports for all details, in brief -      Dg Chest 2 View  Result Date: 01/25/2019 CLINICAL DATA:  Cough. EXAM: CHEST - 2 VIEW COMPARISON:  Radiographs of February 22, 2017. FINDINGS: Stable cardiomegaly. Atherosclerosis of thoracic aorta is noted. Both lungs are clear. The visualized skeletal structures are unremarkable. IMPRESSION: No active cardiopulmonary disease. Aortic Atherosclerosis (ICD10-I70.0). Electronically Signed   By: Marijo Conception, M.D.   On: 01/25/2019 13:34   Ct Chest Wo Contrast  Result Date: 01/25/2019 CLINICAL DATA:  73 year old male with weakness and cough. Dialysis patient. EXAM: CT CHEST WITHOUT CONTRAST TECHNIQUE: Multidetector CT imaging of the chest was performed following the standard protocol without IV contrast. COMPARISON:  Chest radiographs earlier today, 02/22/2017. CT Abdomen and Pelvis 01/27/2017. chest CT 05/14/2009 FINDINGS: Cardiovascular: Extensive calcified coronary artery atherosclerosis and/or stents. Mild to moderate Calcified aortic atherosclerosis. Vascular patency is not evaluated in the absence of IV contrast. Mild-to-moderate cardiomegaly has not significantly changed since 2018. No pericardial effusion. Mediastinum/Nodes: Chronic calcified right hilar lymph nodes. Chronic prominent right paratracheal and anterior carina lymph nodes individually up to 14 millimeters short axis are stable since 2010. Similar  mildly enlarged 18 millimeter subcarinal node is stable. Lungs/Pleura: Mild respiratory motion. Major airways are patent but there is some central peribronchial thickening about the carina. There is bilateral lower lobe abnormal pulmonary opacity consisting of solid and sub solid peribronchial nodularity and right greater than left posterior basal segment lower lobe consolidation. On the right there is a small underlying chronic calcified granuloma in the posterior basal segment. No superimposed pleural effusion. The other bilateral pulmonary lobes appear spared. Upper Abdomen: Negative visible noncontrast liver, spleen, stomach and small bowel. Diverticulosis of the splenic flexure. Extensive upper abdominal calcified atherosclerosis. Musculoskeletal: Chronic abnormal bony sclerosis probably is renal osteodystrophy. Moderate T9-T10 endplate irregularity with intervening disc space loss has occurred since 2018, but there is increased intervening vacuum disc. No paraspinal soft tissue inflammatory stranding. Respiratory motion artifact at the sternum. No acute osseous abnormality identified. IMPRESSION: 1. Bilateral lower lobe pneumonia.  No pleural effusion. 2. Mild chronic mediastinal lymphadenopathy, stable since 2010, likely postinflammatory and benign. 3. Chronic cardiomegaly.  No pericardial effusion. 4. 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 5. Advanced calcified atherosclerosis throughout the chest and visible upper abdomen. Electronically  Signed   By: Genevie Ann M.D.   On: 01/25/2019 16:11    Micro Results    No results found for this or any previous visit (from the past 240 hour(s)).     Today   Subjective:   Brian Fleming today has no headache,no chest abdominal pain,no new weakness tingling or numbness, feels much better wants to go home today.   Objective:   Blood pressure (!) 162/85, pulse 83, temperature 97.6  F (36.4 C), temperature source Oral, resp. rate 16, height 5\' 8"  (1.727 m), weight 89.3 kg, SpO2 98 %.  No intake or output data in the 24 hours ending 01/30/19 0733  Exam Awake Alert, Oriented x 3, No new F.N deficits, Normal affect [place].AT,PERRAL Supple Neck,No JVD, No cervical lymphadenopathy appriciated.  Symmetrical Chest wall movement, Good air movement bilaterally, CTAB RRR,No Gallops,Rubs or new Murmurs, No Parasternal Heave +ve B.Sounds, Abd Soft, Non tender, No organomegaly appriciated, No rebound -guarding or rigidity. No Cyanosis, Clubbing or edema, No new Rash or bruise  Data Review   CBC w Diff:  Lab Results  Component Value Date   WBC 10.1 01/26/2019   HGB 11.2 (L) 01/26/2019   HGB 12.4 (L) 02/17/2012   HCT 34.7 (L) 01/26/2019   HCT 37.4 (L) 02/17/2012   PLT 171 01/26/2019   PLT 71 (L) 02/17/2012   LYMPHOPCT 19 02/22/2017   MONOPCT 12 02/22/2017   EOSPCT 6 02/22/2017   BASOPCT 1 02/22/2017    CMP:  Lab Results  Component Value Date   NA 138 01/26/2019   NA 139 02/17/2012   K 5.2 (H) 01/26/2019   K 5.1 02/25/2012   CL 98 01/26/2019   CL 98 02/17/2012   CO2 27 01/26/2019   CO2 22 02/17/2012   BUN 43 (H) 01/26/2019   BUN 40 (H) 02/17/2012   CREATININE 8.75 (H) 01/26/2019   CREATININE 9.36 (H) 02/17/2012   PROT 8.2 (H) 01/25/2019   PROT 8.2 02/17/2012   ALBUMIN 4.2 01/25/2019   ALBUMIN 3.9 02/17/2012   BILITOT 0.8 01/25/2019   BILITOT 0.6 02/17/2012   ALKPHOS 68 01/25/2019   ALKPHOS 65 02/17/2012   AST 23 01/25/2019   AST 31 02/17/2012   ALT 19 01/25/2019   ALT 26 02/17/2012  .   Total Time in preparing paper work, data evaluation and todays exam - 31 minutes  Epifanio Lesches M.D on 01/27/2019 at 7:33 AM    Note: This dictation was prepared with Dragon dictation along with smaller phrase technology. Any transcriptional errors that result from this process are unintentional.

## 2019-04-29 NOTE — Progress Notes (Deleted)
Patient's Name: Brian Fleming  MRN: [id number]  Referring Provider: Danelle Berry, NP  DOB: 1946/09/19  PCP: Marden Noble, MD  DOS: 05/04/2019  Note by: Gillis Santa, MD  Service setting: Ambulatory outpatient  Specialty: Interventional Pain Management  Location: [facility] Virtual Visit  Visit type: Initial Patient Evaluation  Patient type: New Patient   Pain Management Virtual Encounter Note - Virtual Visit via  ***  Telehealth (real-time audio visits between healthcare provider and patient).  Patient's Phone No.:  [phone] (home); [phone] (mobile); (Preferred) [phone] No e-mail address on record  [place], [place] - [place] [address] Phone: [phone] Fax: [phone]  [facility] (ESRD Bundle Only) - [place], [address] [place] [address] Phone: [phone] Fax: [phone]   Pre-screening note:  Our staff contacted Brian Fleming and offered him an "in person", "face-to-face" appointment versus a telephone encounter. He indicated preferring the telephone encounter, at this time.  Primary Reason(s) for Visit: Tele-Encounter for initial evaluation of one or more chronic problems (new to examiner) potentially causing chronic pain, and posing a threat to normal musculoskeletal function. (Level of risk: High) CC: No chief complaint on file.  I contacted Brian Fleming on 05/04/2019 at 3:40 PM via  *** .      I clearly identified myself as Gillis Santa, MD. I verified that I was speaking with the correct person using two identifiers (Name and date of birth: 11/19/1946).  Advanced Informed Consent I sought verbal advanced consent from Brian Fleming for virtual visit interactions. I informed Brian Fleming of possible security and privacy concerns, risks, and limitations associated with providing "not-in-person" medical evaluation and management services. I  also informed Brian Fleming of the availability of "in-person" appointments. Finally, I informed him that there would be a charge for the virtual visit and that he could be  personally, fully or partially, financially responsible for it. Brian Fleming expressed understanding and agreed to proceed.   HPI  Brian Fleming is a 73 y.o. year old, male patient, contacted today for an initial evaluation of his chronic pain. He has CHF (congestive heart failure) ([place]); Generalized weakness; End stage renal disease ([place]); Essential hypertension; Hyperlipidemia; Pneumonia; and HCAP (healthcare-associated pneumonia) on their problem list.  Pain Assessment: Location:     Radiating:   Onset:   Duration:   Quality:   Severity:  /10 (subjective, self-reported pain score)  Effect on ADL:   Timing:   Modifying factors:    Onset and Duration: {Hx; Onset and Duration:210120511} Cause of pain: {Hx; Cause:210120521} Severity: {Pain Severity:210120502} Timing: {Symptoms; Timing:210120501} Aggravating Factors: {Causes; Aggravating pain factors:210120507} Alleviating Factors: {Causes; Alleviating Factors:210120500} Associated Problems: {Hx; Associated problems:210120515} Quality of Pain: {Hx; Symptom quality or Descriptor:210120531} Previous Examinations or Tests: {Hx; Previous examinations or test:210120529} Previous Treatments: {Hx; Previous Treatment:210120503}  ***  The patient was informed that my practice is divided into two sections: an interventional pain management section, as well as a completely separate and distinct medication management section. I explained that I have procedure days for my interventional therapies, and evaluation days for follow-ups and medication management. Because of the amount of documentation required during both, they are kept separated. This means that there is the possibility that he may be scheduled for a procedure on one day, and medication management the next. I have also informed  him that because of staffing and facility limitations, I no longer take patients for  medication management only. To illustrate the reasons for this, I gave the patient the example of surgeons, and how inappropriate it would be to refer a patient to his/her care, just to write for the post-surgical antibiotics on a surgery done by a different surgeon.   Because interventional pain management is my board-certified specialty, the patient was informed that joining my practice means that they are open to any and all interventional therapies. I made it clear that this does not mean that they will be forced to have any procedures done. What this means is that I believe interventional therapies to be essential part of the diagnosis and proper management of chronic pain conditions. Therefore, patients not interested in these interventional alternatives will be better served under the care of a different practitioner.  The patient was also made aware of my Comprehensive Pain Management Safety Guidelines where by joining my practice, they limit all of their nerve blocks and joint injections to those done by our practice, for as long as we are retained to manage their care.   Historic Controlled Substance Pharmacotherapy Review  PMP and historical list of controlled substances: ***  Highest opioid analgesic regimen found: ***  Most recent opioid analgesic: ***  Current opioid analgesics: ***  Highest recorded MME/day: *** mg/day MME/day: *** mg/day Medications: Bottles not available for inspection. Pharmacodynamics: Desired effects: Analgesia: The patient reports >50% benefit. Reported improvement in function: The patient reports medication allows him to accomplish basic ADLs. Clinically meaningful improvement in function (CMIF): Sustained CMIF goals met Perceived effectiveness: Described as relatively effective, allowing for increase in activities of daily living (ADL) Undesirable effects: Side-effects or  Adverse reactions: None reported Historical Monitoring: The patient  reports no history of drug use. List of all UDS Test(s): No results found for: MDMA, COCAINSCRNUR, [place], [place], CANNABQUANT, [place], [place] List of other Serum/Urine Drug Screening Test(s):  No results found for: AMPHSCRSER, BARBSCRSER, BENZOSCRSER, COCAINSCRSER, COCAINSCRNUR, PCPSCRSER, PCPQUANT, THCSCRSER, THCU, CANNABQUANT, OPIATESCRSER, OXYSCRSER, PROPOXSCRSER, ETH Historical Background Evaluation: [place] PMP: PDMP not reviewed this encounter. Six (6) year initial data search conducted.             PMP NARX Score Report:  Narcotic: *** Sedative: *** Stimulant: *** [place] Department of public safety, offender search: Editor, commissioning Information) Non-contributory Risk Assessment Profile: Aberrant behavior: None observed or detected today Risk factors for fatal opioid overdose: None identified today PMP NARX Overdose Risk Score: *** Fatal overdose hazard ratio (HR): Calculation deferred Non-fatal overdose hazard ratio (HR): Calculation deferred Risk of opioid abuse or dependence: 0.7-3.0% with doses ? 36 MME/day and 6.1-26% with doses ? 120 MME/day. Substance use disorder (SUD) risk level: See below Personal History of Substance Abuse (SUD-Substance use disorder):  Alcohol:    Illegal Drugs:    Rx Drugs:    ORT Risk Level calculation:    ORT Scoring interpretation table:  Score <3 = Low Risk for SUD  Score between 4-7 = Moderate Risk for SUD  Score >8 = High Risk for Opioid Abuse   Pharmacologic Plan: As per protocol, I have not taken over any controlled substance management, pending the results of ordered tests and/or consults.            Initial impression: Pending review of available data and ordered tests.  Meds   Current Outpatient Medications:  .  amLODipine (NORVASC) 10 MG tablet, Take 10 mg by mouth at bedtime., Disp: , Rfl:  .  aspirin EC 81 MG tablet, Take 1 tablet (81 mg total)  by mouth daily., Disp: 30  tablet, Rfl: 2 .  atorvastatin (LIPITOR) 40 MG tablet, Take 1 tablet (40 mg total) by mouth daily at 6 PM. (Patient not taking: Reported on 02/22/2017), Disp: 30 tablet, Rfl: 0 .  B Complex-C-Folic Acid (RENA-VITE PO), Take 1 tablet by mouth daily., Disp: , Rfl:  .  calcium carbonate (TUMS - DOSED IN MG ELEMENTAL CALCIUM) 500 MG chewable tablet, Chew 2 tablets (400 mg of elemental calcium total) by mouth 3 (three) times daily., Disp: 40 tablet, Rfl: 0 .  cefdinir (OMNICEF) 300 MG capsule, Take 1 capsule (300 mg total) by mouth every other day., Disp: 10 capsule, Rfl: 0 .  etodolac (LODINE) 200 MG capsule, Take 1 capsule (200 mg total) by mouth every 8 (eight) hours. (Patient not taking: Reported on 02/22/2017), Disp: 12 capsule, Rfl: 0 .  gabapentin (NEURONTIN) 300 MG capsule, Take 300 mg by mouth daily. , Disp: , Rfl:  .  hydrALAZINE (APRESOLINE) 25 MG tablet, Take 1 tablet (25 mg total) by mouth 3 (three) times daily. (Patient not taking: Reported on 01/25/2019), Disp: 60 tablet, Rfl: 0 .  predniSONE (STERAPRED UNI-PAK 21 TAB) 10 MG (21) TBPK tablet, Taper by 10 mg daily, Disp: 21 tablet, Rfl: 0 .  ramipril (ALTACE) 10 MG capsule, Take 10 mg by mouth daily., Disp: , Rfl:  .  ranitidine (ZANTAC) 150 MG capsule, Take 150 mg by mouth 2 (two) times daily., Disp: , Rfl:  .  sevelamer carbonate (RENVELA) 800 MG tablet, Take 2,400 mg by mouth 3 (three) times daily., Disp: , Rfl:  .  traMADol (ULTRAM) 50 MG tablet, Take 100 mg by mouth 4 (four) times daily. , Disp: , Rfl:   ROS  Cardiovascular: {Hx; Cardiovascular History:210120525} Pulmonary or Respiratory: {Hx; Pumonary and/or Respiratory History:210120523} Neurological: {Hx; Neurological:210120504} Review of Past Neurological Studies:  Results for orders placed or performed during the hospital encounter of 02/22/17  CT HEAD WO CONTRAST   Narrative   CLINICAL DATA:  Weakness.  Headache and nausea.  EXAM: CT HEAD WITHOUT  CONTRAST  TECHNIQUE: Contiguous axial images were obtained from the base of the skull through the vertex without intravenous contrast.  COMPARISON:  04/11/2016  FINDINGS: Brain: Stable cerebral atrophy. No evidence for acute hemorrhage, mass lesion, midline shift, hydrocephalus or large infarct.  Vascular: Atherosclerotic calcifications.  Skull: Normal. Negative for fracture or focal lesion.  Sinuses/Orbits: Mild disease in posterior left ethmoid air cells.  Other: Soft tissue calcifications throughout the scalp.  IMPRESSION: No acute intracranial abnormality.  Stable atrophy.   Electronically Signed   By: Markus Daft M.D.   On: 02/22/2017 13:43   Results for orders placed or performed during the hospital encounter of 04/11/16  MR Brain Wo Contrast   Narrative   CLINICAL DATA:  Weakness. Seizures for the past 2 weeks. End-stage renal disease.  EXAM: MRI HEAD WITHOUT CONTRAST  TECHNIQUE: Multiplanar, multiecho pulse sequences of the brain and surrounding structures were obtained without intravenous contrast.  COMPARISON:  Head CT from yesterday  FINDINGS: Calvarium and upper cervical spine: There is advanced degenerative change at the atlantodental joint with upper canal narrowing that is overestimated when compared to axial CT images. There is borderline basilar impression.  Orbits: Bilateral cataract resection.  Sinuses and Mastoids: Clear.  Brain: No acute abnormality such as acute infarct, hemorrhage, hydrocephalus, or mass lesion. No evidence of large vessel occlusion.  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  Remote small focus  of cortical gliosis in the posterior left frontal lobe best seen on FLAIR imaging. No other cortical finding to explain seizure history. Widening of the sylvian fissures. Brain volume is overall within limits for age.  IMPRESSION: 1. No acute finding. 2. Small focus of left  posterior frontal cortical gliosis. No other explanation for history of seizures. 3. Remote bilateral cerebellar small vessel infarcts.   Electronically Signed   By: Monte Fantasia M.D.   On: 04/12/2016 10:58    Psychological-Psychiatric: {Hx; Psychological-Psychiatric History:210120512} Gastrointestinal: {Hx; Gastrointestinal:210120527} Genitourinary: {Hx; Genitourinary:210120506} Hematological: {Hx; Hematological:210120510} Endocrine: {Hx; Endocrine history:210120509} Rheumatologic: {Hx; Rheumatological:210120530} Musculoskeletal: {Hx; Musculoskeletal:210120528} Work History: {Hx; Work history:210120514}  Allergies  Mr. Posten has No Known Allergies.  Laboratory Chemistry  Inflammation Markers (CRP: Acute Phase) (ESR: Chronic Phase) No results found for: CRP, ESRSEDRATE, LATICACIDVEN                       Rheumatology Markers No results found for: RF, ANA, LABURIC, URICUR, LYMEIGGIGMAB, LYMEABIGMQN, HLAB27                      Renal Function Markers Lab Results  Component Value Date   BUN 43 (H) 01/26/2019   CREATININE 8.75 (H) 01/26/2019   GFRAA 6 (L) 01/26/2019   GFRNONAA 6 (L) 01/26/2019                             Hepatic Function Markers Lab Results  Component Value Date   AST 23 01/25/2019   ALT 19 01/25/2019   ALBUMIN 4.2 01/25/2019   ALKPHOS 68 01/25/2019   LIPASE 20 02/22/2017                        Electrolytes Lab Results  Component Value Date   NA 138 01/26/2019   K 5.2 (H) 01/26/2019   CL 98 01/26/2019   CALCIUM 8.5 (L) 01/26/2019   PHOS 6.3 (H) 01/27/2019                        Neuropathy Markers Lab Results  Component Value Date   HIV Non Reactive 01/25/2019                        CNS Tests No results found for: COLORCSF, APPEARCSF, RBCCOUNTCSF, WBCCSF, POLYSCSF, LYMPHSCSF, EOSCSF, PROTEINCSF, GLUCCSF, JCVIRUS, CSFOLI, IGGCSF, LABACHR, ACETBL                      Bone Pathology Markers No results found for: VD25OH, [id number],  TD1761YW7, PX1062IR4, 25OHVITD1, 25OHVITD2, 25OHVITD3, TESTOFREE, TESTOSTERONE                       Coagulation Parameters Lab Results  Component Value Date   PLT 171 01/26/2019                        Cardiovascular Markers Lab Results  Component Value Date   BNP 512.0 (H) 01/25/2019   TROPONINI 0.04 (HH) 01/25/2019   HGB 11.2 (L) 01/26/2019   HCT 34.7 (L) 01/26/2019                         ID Markers Lab Results  Component Value Date   HIV Non Reactive 01/25/2019  CA Markers No results found for: CEA, CA125, LABCA2                      Endocrine Markers Lab Results  Component Value Date   TSH 1.040 04/11/2016                        Note: Lab results reviewed.  Imaging Review  Cervical Imaging: Cervical MR wo contrast: No results found for this or any previous visit. Cervical MR wo contrast: No procedure found. Cervical MR w/wo contrast: No results found for this or any previous visit. Cervical MR w contrast: No results found for this or any previous visit. Cervical CT wo contrast: No results found for this or any previous visit. Cervical CT w/wo contrast: No results found for this or any previous visit. Cervical CT w/wo contrast: No results found for this or any previous visit. Cervical CT w contrast: No results found for this or any previous visit. Cervical CT outside: No results found for this or any previous visit. Cervical DG 1 view: No results found for this or any previous visit. Cervical DG 2-3 views: No results found for this or any previous visit. Cervical DG F/E views: No results found for this or any previous visit. Cervical DG 2-3 clearing views: No results found for this or any previous visit. Cervical DG Bending/F/E views: No results found for this or any previous visit. Cervical DG complete: No results found for this or any previous visit. Cervical DG Myelogram views: No results found for this or any previous visit. Cervical  DG Myelogram views: No results found for this or any previous visit. Cervical Discogram views: No results found for this or any previous visit.  Shoulder Imaging: Shoulder-R MR w contrast: No results found for this or any previous visit. Shoulder-L MR w contrast: No results found for this or any previous visit. Shoulder-R MR w/wo contrast: No results found for this or any previous visit. Shoulder-L MR w/wo contrast: No results found for this or any previous visit. Shoulder-R MR wo contrast: No results found for this or any previous visit. Shoulder-L MR wo contrast: No results found for this or any previous visit. Shoulder-R CT w contrast: No results found for this or any previous visit. Shoulder-L CT w contrast: No results found for this or any previous visit. Shoulder-R CT w/wo contrast: No results found for this or any previous visit. Shoulder-L CT w/wo contrast: No results found for this or any previous visit. Shoulder-R CT wo contrast: No results found for this or any previous visit. Shoulder-L CT wo contrast: No results found for this or any previous visit. Shoulder-R DG Arthrogram: No results found for this or any previous visit. Shoulder-L DG Arthrogram: No results found for this or any previous visit. Shoulder-R DG 1 view: No results found for this or any previous visit. Shoulder-L DG 1 view: No results found for this or any previous visit. Shoulder-R DG: No results found for this or any previous visit. Shoulder-L DG: No results found for this or any previous visit.  Thoracic Imaging: Thoracic MR wo contrast: No results found for this or any previous visit. Thoracic MR wo contrast: No procedure found. Thoracic MR w/wo contrast: No results found for this or any previous visit. Thoracic MR w contrast: No results found for this or any previous visit. Thoracic CT wo contrast: No results found for this or any previous visit. Thoracic CT w/wo contrast:  No results found for this or any  previous visit. Thoracic CT w/wo contrast: No results found for this or any previous visit. Thoracic CT w contrast: No results found for this or any previous visit. Thoracic DG 2-3 views: No results found for this or any previous visit. Thoracic DG 4 views: No results found for this or any previous visit. Thoracic DG: No results found for this or any previous visit. Thoracic DG w/swimmers view: No results found for this or any previous visit. Thoracic DG Myelogram views: No results found for this or any previous visit. Thoracic DG Myelogram views: No results found for this or any previous visit.  Lumbosacral Imaging: Lumbar MR wo contrast: No results found for this or any previous visit. Lumbar MR wo contrast: No procedure found. Lumbar MR w/wo contrast: No results found for this or any previous visit. Lumbar MR w/wo contrast: No results found for this or any previous visit. Lumbar MR w contrast: No results found for this or any previous visit. Lumbar CT wo contrast: No results found for this or any previous visit. Lumbar CT w/wo contrast: No results found for this or any previous visit. Lumbar CT w/wo contrast: No results found for this or any previous visit. Lumbar CT w contrast: No results found for this or any previous visit. Lumbar DG 1V: No results found for this or any previous visit. Lumbar DG 1V (Clearing): No results found for this or any previous visit. Lumbar DG 2-3V (Clearing): No results found for this or any previous visit. Lumbar DG 2-3 views: No results found for this or any previous visit. Lumbar DG (Complete) 4+V: No results found for this or any previous visit.       Lumbar DG F/E views: No results found for this or any previous visit.       Lumbar DG Bending views: No results found for this or any previous visit.       Lumbar DG Myelogram views: No results found for this or any previous visit. Lumbar DG Myelogram: No results found for this or any previous  visit. Lumbar DG Myelogram: No results found for this or any previous visit. Lumbar DG Myelogram: No results found for this or any previous visit. Lumbar DG Myelogram Lumbosacral: No results found for this or any previous visit. Lumbar DG Diskogram views: No results found for this or any previous visit. Lumbar DG Diskogram views: No results found for this or any previous visit. Lumbar DG Epidurogram OP: No results found for this or any previous visit. Lumbar DG Epidurogram IP: No results found for this or any previous visit.  Sacroiliac Joint Imaging: Sacroiliac Joint DG: No results found for this or any previous visit. Sacroiliac Joint MR w/wo contrast: No results found for this or any previous visit. Sacroiliac Joint MR wo contrast: No results found for this or any previous visit.  Spine Imaging: Whole Spine DG Myelogram views: No results found for this or any previous visit. Whole Spine MR Mets screen: No results found for this or any previous visit. Whole Spine MR Mets screen: No results found for this or any previous visit. Whole Spine MR w/wo: No results found for this or any previous visit. MRA Spinal Canal w/ cm: No results found for this or any previous visit. MRA Spinal Canal wo/ cm: No procedure found. MRA Spinal Canal w/wo cm: No results found for this or any previous visit. Spine Outside MR Films: No results found for this or any previous visit.  Spine Outside CT Films: No results found for this or any previous visit. CT-Guided Biopsy: No results found for this or any previous visit. CT-Guided Needle Placement: No results found for this or any previous visit. DG Spine outside: No results found for this or any previous visit. IR Spine outside: No results found for this or any previous visit. NM Spine outside: No results found for this or any previous visit.  Hip Imaging: Hip-R MR w contrast: No results found for this or any previous visit. Hip-L MR w contrast: No results  found for this or any previous visit. Hip-R MR w/wo contrast: No results found for this or any previous visit. Hip-L MR w/wo contrast: No results found for this or any previous visit. Hip-R MR wo contrast: No results found for this or any previous visit. Hip-L MR wo contrast: No results found for this or any previous visit. Hip-R CT w contrast: No results found for this or any previous visit. Hip-L CT w contrast: No results found for this or any previous visit. Hip-R CT w/wo contrast: No results found for this or any previous visit. Hip-L CT w/wo contrast: No results found for this or any previous visit. Hip-R CT wo contrast: No results found for this or any previous visit. Hip-L CT wo contrast: No results found for this or any previous visit. Hip-R DG 2-3 views: No results found for this or any previous visit. Hip-L DG 2-3 views: No results found for this or any previous visit. Hip-R DG Arthrogram: No results found for this or any previous visit. Hip-L DG Arthrogram: No results found for this or any previous visit. Hip-B DG Bilateral: No results found for this or any previous visit.  Knee Imaging: Knee-R MR w contrast: No results found for this or any previous visit. Knee-L MR w contrast: No results found for this or any previous visit. Knee-R MR w/wo contrast: No results found for this or any previous visit. Knee-L MR w/wo contrast: No results found for this or any previous visit. Knee-R MR wo contrast: No results found for this or any previous visit. Knee-L MR wo contrast: No results found for this or any previous visit. Knee-R CT w contrast: No results found for this or any previous visit. Knee-L CT w contrast: No results found for this or any previous visit. Knee-R CT w/wo contrast: No results found for this or any previous visit. Knee-L CT w/wo contrast: No results found for this or any previous visit. Knee-R CT wo contrast: No results found for this or any previous visit. Knee-L  CT wo contrast: No results found for this or any previous visit. Knee-R DG 1-2 views: No results found for this or any previous visit. Knee-L DG 1-2 views: No results found for this or any previous visit. Knee-R DG 3 views: No results found for this or any previous visit. Knee-L DG 3 views: No results found for this or any previous visit. Knee-R DG 4 views: No results found for this or any previous visit. Knee-L DG 4 views: No results found for this or any previous visit. Knee-R DG Arthrogram: No results found for this or any previous visit. Knee-L DG Arthrogram: No results found for this or any previous visit.  Ankle Imaging: Ankle-R DG Complete: No results found for this or any previous visit. Ankle-L DG Complete: No results found for this or any previous visit.  Foot Imaging: Foot-R DG Complete: No results found for this or any previous visit. Foot-L DG  Complete: No results found for this or any previous visit.  Elbow Imaging: Elbow-R DG Complete: No results found for this or any previous visit. Elbow-L DG Complete: No results found for this or any previous visit.  Wrist Imaging: Wrist-R DG Complete: No results found for this or any previous visit. Wrist-L DG Complete: No results found for this or any previous visit.  Hand Imaging: Hand-R DG Complete: No results found for this or any previous visit. Hand-L DG Complete: No results found for this or any previous visit.  Complexity Note: Imaging results reviewed. Results shared with Mr. Janczak, using Layman's terms.                         [place]  Drug: Mr. Duckett  reports no history of drug use. Alcohol:  reports no history of alcohol use. Tobacco:  reports that he has been smoking cigarettes. He has been smoking about 0.25 packs per day. He has never used smokeless tobacco. Medical:  has a past medical history of CHF (congestive heart failure) ([place]), Dialysis patient ([place]), DJD (degenerative joint disease), End stage renal  disease on dialysis ([place]), Hyperlipidemia, and Hypertension. Family: family history includes Hypertension in his brother.  Past Surgical History:  Procedure Laterality Date  . AV FISTULA PLACEMENT    . BACK SURGERY     Active Ambulatory Problems    Diagnosis Date Noted  . CHF (congestive heart failure) ([place]) 08/20/2015  . Generalized weakness 04/11/2016  . End stage renal disease ([place]) 12/04/2017  . Essential hypertension 12/04/2017  . Hyperlipidemia 12/04/2017  . Pneumonia 01/25/2019  . HCAP (healthcare-associated pneumonia) 01/26/2019   Resolved Ambulatory Problems    Diagnosis Date Noted  . No Resolved Ambulatory Problems   Past Medical History:  Diagnosis Date  . Dialysis patient ([place])   . DJD (degenerative joint disease)   . End stage renal disease on dialysis ([place])   . Hypertension    Assessment  Primary Diagnosis & Pertinent Problem List: There were no encounter diagnoses.  Visit Diagnosis (New problems to examiner): No diagnosis found. Plan of Care (Initial workup plan)  Note: Mr. Meiklejohn was reminded that as per protocol, today's visit has been an evaluation only. We have not taken over the patient's controlled substance management.  Problem-specific plan: No problem-specific Assessment & Plan notes found for this encounter.  Lab Orders  No laboratory test(s) ordered today   Imaging Orders  No imaging studies ordered today   Referral Orders  No referral(s) requested today   Procedure Orders    No procedure(s) ordered today   Pharmacotherapy (current): Medications ordered:  No orders of the defined types were placed in this encounter.  Medications administered during this visit: Barth Trella had no medications administered during this visit.   Pharmacological management options:  Opioid Analgesics: The patient was informed that there is no guarantee that he would be a candidate for opioid analgesics. The decision will be made following CDC  guidelines. This decision will be based on the results of diagnostic studies, as well as Mr. Orourke risk profile.   Membrane stabilizer: To be determined at a later time  Muscle relaxant: To be determined at a later time  NSAID: To be determined at a later time  Other analgesic(s): To be determined at a later time   Interventional management options: Mr. Dimmick was informed that there is no guarantee that he would be a candidate for interventional therapies. The decision will be based  on the results of diagnostic studies, as well as Mr. Monts risk profile.  Procedure(s) under consideration:  ***   Provider-requested follow-up: No follow-ups on file.  Future Appointments  Date Time Provider [place]  05/04/2019 11:00 AM Gillis Santa, MD [facility] None    Total duration of non-face-to-face encounter: *** minutes.  Primary Care Physician: Marden Noble, MD Location: [facility] Note by: Gillis Santa, MD Date: 05/04/2019; Time: 3:40 PM

## 2019-05-03 ENCOUNTER — Telehealth: Payer: Self-pay | Admitting: *Deleted

## 2019-05-03 NOTE — Telephone Encounter (Signed)
Attempted to call patient, He is currently at dialysis and is there on M,W,F until 1100.  I alerted the person that I would call him back in the morning around 0900 to complete questionaire prior to his virtual visit with Dr Holley Raring.

## 2019-05-04 ENCOUNTER — Ambulatory Visit: Payer: Medicare Other | Admitting: Student in an Organized Health Care Education/Training Program

## 2019-05-04 ENCOUNTER — Other Ambulatory Visit: Payer: Self-pay

## 2019-05-04 ENCOUNTER — Telehealth: Payer: Self-pay | Admitting: *Deleted

## 2019-05-04 NOTE — Telephone Encounter (Signed)
Attempted to reach patient for NP questionaire prior to virtual appt with Dr Holley Raring. Voicemail left to please return the call.

## 2019-07-11 ENCOUNTER — Emergency Department: Payer: Medicare Other

## 2019-07-11 ENCOUNTER — Inpatient Hospital Stay
Admission: EM | Admit: 2019-07-11 | Discharge: 2019-07-14 | DRG: 291 | Disposition: A | Discharge status: Home Health | Payer: Medicare Other | Attending: Internal Medicine | Admitting: Internal Medicine

## 2019-07-11 ENCOUNTER — Other Ambulatory Visit: Payer: Self-pay

## 2019-07-11 DIAGNOSIS — R7989 Other specified abnormal findings of blood chemistry: Secondary | ICD-10-CM

## 2019-07-11 DIAGNOSIS — W06XXXA Fall from bed, initial encounter: Secondary | ICD-10-CM | POA: Diagnosis present

## 2019-07-11 DIAGNOSIS — I4891 Unspecified atrial fibrillation: Secondary | ICD-10-CM | POA: Diagnosis present

## 2019-07-11 DIAGNOSIS — K5730 Diverticulosis of large intestine without perforation or abscess without bleeding: Secondary | ICD-10-CM | POA: Diagnosis present

## 2019-07-11 DIAGNOSIS — I700 Atherosclerosis of aorta: Secondary | ICD-10-CM | POA: Diagnosis present

## 2019-07-11 DIAGNOSIS — M48061 Spinal stenosis, lumbar region without neurogenic claudication: Secondary | ICD-10-CM | POA: Diagnosis present

## 2019-07-11 DIAGNOSIS — M6282 Rhabdomyolysis: Secondary | ICD-10-CM | POA: Diagnosis present

## 2019-07-11 DIAGNOSIS — W19XXXA Unspecified fall, initial encounter: Secondary | ICD-10-CM | POA: Diagnosis present

## 2019-07-11 DIAGNOSIS — S4291XA Fracture of right shoulder girdle, part unspecified, initial encounter for closed fracture: Secondary | ICD-10-CM

## 2019-07-11 DIAGNOSIS — Z7982 Long term (current) use of aspirin: Secondary | ICD-10-CM

## 2019-07-11 DIAGNOSIS — N186 End stage renal disease: Secondary | ICD-10-CM | POA: Diagnosis present

## 2019-07-11 DIAGNOSIS — N2581 Secondary hyperparathyroidism of renal origin: Secondary | ICD-10-CM | POA: Diagnosis present

## 2019-07-11 DIAGNOSIS — I361 Nonrheumatic tricuspid (valve) insufficiency: Secondary | ICD-10-CM | POA: Diagnosis not present

## 2019-07-11 DIAGNOSIS — N135 Crossing vessel and stricture of ureter without hydronephrosis: Secondary | ICD-10-CM | POA: Diagnosis present

## 2019-07-11 DIAGNOSIS — I4892 Unspecified atrial flutter: Secondary | ICD-10-CM | POA: Diagnosis not present

## 2019-07-11 DIAGNOSIS — Z20828 Contact with and (suspected) exposure to other viral communicable diseases: Secondary | ICD-10-CM | POA: Diagnosis present

## 2019-07-11 DIAGNOSIS — I132 Hypertensive heart and chronic kidney disease with heart failure and with stage 5 chronic kidney disease, or end stage renal disease: Principal | ICD-10-CM | POA: Diagnosis present

## 2019-07-11 DIAGNOSIS — K449 Diaphragmatic hernia without obstruction or gangrene: Secondary | ICD-10-CM | POA: Diagnosis present

## 2019-07-11 DIAGNOSIS — N250 Renal osteodystrophy: Secondary | ICD-10-CM | POA: Diagnosis present

## 2019-07-11 DIAGNOSIS — I5033 Acute on chronic diastolic (congestive) heart failure: Secondary | ICD-10-CM | POA: Diagnosis present

## 2019-07-11 DIAGNOSIS — F17210 Nicotine dependence, cigarettes, uncomplicated: Secondary | ICD-10-CM | POA: Diagnosis present

## 2019-07-11 DIAGNOSIS — I313 Pericardial effusion (noninflammatory): Secondary | ICD-10-CM | POA: Diagnosis present

## 2019-07-11 DIAGNOSIS — G8929 Other chronic pain: Secondary | ICD-10-CM | POA: Diagnosis present

## 2019-07-11 DIAGNOSIS — E785 Hyperlipidemia, unspecified: Secondary | ICD-10-CM | POA: Diagnosis present

## 2019-07-11 DIAGNOSIS — R748 Abnormal levels of other serum enzymes: Secondary | ICD-10-CM

## 2019-07-11 DIAGNOSIS — Z79891 Long term (current) use of opiate analgesic: Secondary | ICD-10-CM

## 2019-07-11 DIAGNOSIS — I739 Peripheral vascular disease, unspecified: Secondary | ICD-10-CM | POA: Diagnosis present

## 2019-07-11 DIAGNOSIS — Y92003 Bedroom of unspecified non-institutional (private) residence as the place of occurrence of the external cause: Secondary | ICD-10-CM

## 2019-07-11 DIAGNOSIS — I248 Other forms of acute ischemic heart disease: Secondary | ICD-10-CM | POA: Diagnosis present

## 2019-07-11 DIAGNOSIS — N400 Enlarged prostate without lower urinary tract symptoms: Secondary | ICD-10-CM | POA: Diagnosis present

## 2019-07-11 DIAGNOSIS — D631 Anemia in chronic kidney disease: Secondary | ICD-10-CM | POA: Diagnosis present

## 2019-07-11 DIAGNOSIS — E876 Hypokalemia: Secondary | ICD-10-CM | POA: Diagnosis present

## 2019-07-11 DIAGNOSIS — I340 Nonrheumatic mitral (valve) insufficiency: Secondary | ICD-10-CM | POA: Diagnosis present

## 2019-07-11 DIAGNOSIS — Z992 Dependence on renal dialysis: Secondary | ICD-10-CM | POA: Diagnosis not present

## 2019-07-11 DIAGNOSIS — S43101A Unspecified dislocation of right acromioclavicular joint, initial encounter: Secondary | ICD-10-CM

## 2019-07-11 DIAGNOSIS — M4644 Discitis, unspecified, thoracic region: Secondary | ICD-10-CM | POA: Diagnosis present

## 2019-07-11 DIAGNOSIS — R778 Other specified abnormalities of plasma proteins: Secondary | ICD-10-CM

## 2019-07-11 DIAGNOSIS — M5489 Other dorsalgia: Secondary | ICD-10-CM

## 2019-07-11 DIAGNOSIS — Z79899 Other long term (current) drug therapy: Secondary | ICD-10-CM

## 2019-07-11 DIAGNOSIS — Z8249 Family history of ischemic heart disease and other diseases of the circulatory system: Secondary | ICD-10-CM

## 2019-07-11 DIAGNOSIS — T1490XA Injury, unspecified, initial encounter: Secondary | ICD-10-CM

## 2019-07-11 DIAGNOSIS — I509 Heart failure, unspecified: Secondary | ICD-10-CM

## 2019-07-11 DIAGNOSIS — Z8673 Personal history of transient ischemic attack (TIA), and cerebral infarction without residual deficits: Secondary | ICD-10-CM

## 2019-07-11 DIAGNOSIS — I484 Atypical atrial flutter: Secondary | ICD-10-CM | POA: Diagnosis present

## 2019-07-11 HISTORY — DX: Unspecified atrial flutter: I48.92

## 2019-07-11 LAB — CBC WITH DIFFERENTIAL/PLATELET
Abs Immature Granulocytes: 0.02 10*3/uL (ref 0.00–0.07)
Basophils Absolute: 0 10*3/uL (ref 0.0–0.1)
Basophils Relative: 1 %
Eosinophils Absolute: 0.2 10*3/uL (ref 0.0–0.5)
Eosinophils Relative: 4 %
HCT: 36.4 % — ABNORMAL LOW (ref 39.0–52.0)
Hemoglobin: 11.9 g/dL — ABNORMAL LOW (ref 13.0–17.0)
Immature Granulocytes: 1 %
Lymphocytes Relative: 20 %
Lymphs Abs: 0.9 10*3/uL (ref 0.7–4.0)
MCH: 30.6 pg (ref 26.0–34.0)
MCHC: 32.7 g/dL (ref 30.0–36.0)
MCV: 93.6 fL (ref 80.0–100.0)
Monocytes Absolute: 0.5 10*3/uL (ref 0.1–1.0)
Monocytes Relative: 12 %
Neutro Abs: 2.7 10*3/uL (ref 1.7–7.7)
Neutrophils Relative %: 62 %
Platelets: 168 10*3/uL (ref 150–400)
RBC: 3.89 MIL/uL — ABNORMAL LOW (ref 4.22–5.81)
RDW: 13.4 % (ref 11.5–15.5)
WBC: 4.3 10*3/uL (ref 4.0–10.5)
nRBC: 0 % (ref 0.0–0.2)

## 2019-07-11 LAB — LIPASE, BLOOD: Lipase: 33 U/L (ref 11–51)

## 2019-07-11 LAB — COMPREHENSIVE METABOLIC PANEL
ALT: 9 U/L (ref 0–44)
AST: 19 U/L (ref 15–41)
Albumin: 3.7 g/dL (ref 3.5–5.0)
Alkaline Phosphatase: 75 U/L (ref 38–126)
Anion gap: 18 — ABNORMAL HIGH (ref 5–15)
BUN: 39 mg/dL — ABNORMAL HIGH (ref 8–23)
CO2: 27 mmol/L (ref 22–32)
Calcium: 8 mg/dL — ABNORMAL LOW (ref 8.9–10.3)
Chloride: 93 mmol/L — ABNORMAL LOW (ref 98–111)
Creatinine, Ser: 9.98 mg/dL — ABNORMAL HIGH (ref 0.61–1.24)
GFR calc Af Amer: 5 mL/min — ABNORMAL LOW (ref >60)
GFR calc non Af Amer: 5 mL/min — ABNORMAL LOW (ref >60)
Glucose, Bld: 75 mg/dL (ref 70–99)
Potassium: 4.5 mmol/L (ref 3.5–5.1)
Sodium: 138 mmol/L (ref 135–145)
Total Bilirubin: 1 mg/dL (ref 0.3–1.2)
Total Protein: 7.7 g/dL (ref 6.5–8.1)

## 2019-07-11 LAB — SARS CORONAVIRUS 2 BY RT PCR (HOSPITAL ORDER, PERFORMED IN ~~LOC~~ HOSPITAL LAB): SARS Coronavirus 2: NEGATIVE

## 2019-07-11 LAB — CK: Total CK: 661 U/L — ABNORMAL HIGH (ref 49–397)

## 2019-07-11 LAB — TROPONIN I (HIGH SENSITIVITY)
Troponin I (High Sensitivity): 54 ng/L — ABNORMAL HIGH (ref <18)
Troponin I (High Sensitivity): 55 ng/L — ABNORMAL HIGH (ref <18)

## 2019-07-11 LAB — BRAIN NATRIURETIC PEPTIDE: B Natriuretic Peptide: 737 pg/mL — ABNORMAL HIGH (ref 0.0–100.0)

## 2019-07-11 LAB — LACTIC ACID, PLASMA: Lactic Acid, Venous: 1.7 mmol/L (ref 0.5–1.9)

## 2019-07-11 MED ORDER — ONDANSETRON HCL 4 MG PO TABS: 4.0000 mg | ORAL_TABLET | Freq: Four times a day (QID) | ORAL | Status: DC | PRN

## 2019-07-11 MED ORDER — CALCIUM CARBONATE ANTACID 500 MG PO CHEW
400.0000 mg | CHEWABLE_TABLET | Freq: Three times a day (TID) | ORAL | Status: DC
  Administered 2019-07-12 – 2019-07-14 (×9): 400 mg via ORAL
  Filled 2019-07-11 (×9): qty 2

## 2019-07-11 MED ORDER — IOHEXOL 300 MG/ML  SOLN
100.0000 mL | Freq: Once | INTRAMUSCULAR | Status: AC | PRN
  Administered 2019-07-11: 19:00:00 100 mL via INTRAVENOUS

## 2019-07-11 MED ORDER — RAMIPRIL 10 MG PO CAPS
10.0000 mg | ORAL_CAPSULE | Freq: Every day | ORAL | Status: DC
  Filled 2019-07-11: qty 1

## 2019-07-11 MED ORDER — ACETAMINOPHEN 325 MG PO TABS: 650.0000 mg | ORAL_TABLET | Freq: Four times a day (QID) | ORAL | Status: DC | PRN

## 2019-07-11 MED ORDER — SEVELAMER CARBONATE 800 MG PO TABS
2400.0000 mg | ORAL_TABLET | Freq: Three times a day (TID) | ORAL | Status: DC
  Administered 2019-07-12 – 2019-07-14 (×7): 2400 mg via ORAL
  Filled 2019-07-11 (×7): qty 3

## 2019-07-11 MED ORDER — FAMOTIDINE 20 MG PO TABS
20.0000 mg | ORAL_TABLET | Freq: Every day | ORAL | Status: DC
  Administered 2019-07-12 – 2019-07-14 (×3): 20 mg via ORAL
  Filled 2019-07-11 (×3): qty 1

## 2019-07-11 MED ORDER — TRAMADOL HCL 50 MG PO TABS
100.0000 mg | ORAL_TABLET | Freq: Two times a day (BID) | ORAL | Status: DC
  Administered 2019-07-12: 100 mg via ORAL
  Filled 2019-07-11 (×2): qty 2

## 2019-07-11 MED ORDER — RENA-VITE PO TABS
1.0000 | ORAL_TABLET | Freq: Every day | ORAL | Status: DC
  Administered 2019-07-12 – 2019-07-14 (×3): 1 via ORAL
  Filled 2019-07-11 (×3): qty 1

## 2019-07-11 MED ORDER — MAGNESIUM HYDROXIDE 400 MG/5ML PO SUSP: 30.0000 mL | Freq: Every day | ORAL | Status: DC | PRN

## 2019-07-11 MED ORDER — ENOXAPARIN SODIUM 40 MG/0.4ML ~~LOC~~ SOLN: 40.0000 mg | SUBCUTANEOUS | Status: DC

## 2019-07-11 MED ORDER — AMLODIPINE BESYLATE 10 MG PO TABS
10.0000 mg | ORAL_TABLET | Freq: Every day | ORAL | Status: DC
  Administered 2019-07-12 – 2019-07-13 (×3): 10 mg via ORAL
  Filled 2019-07-11 (×3): qty 1

## 2019-07-11 MED ORDER — ASPIRIN EC 81 MG PO TBEC: 81.0000 mg | DELAYED_RELEASE_TABLET | Freq: Every day | ORAL | Status: DC

## 2019-07-11 MED ORDER — GABAPENTIN 300 MG PO CAPS
300.0000 mg | ORAL_CAPSULE | Freq: Every day | ORAL | Status: DC
  Administered 2019-07-12 – 2019-07-14 (×3): 300 mg via ORAL
  Filled 2019-07-11 (×3): qty 1

## 2019-07-11 MED ORDER — ONDANSETRON HCL 4 MG/2ML IJ SOLN: 4.0000 mg | Freq: Four times a day (QID) | INTRAMUSCULAR | Status: DC | PRN

## 2019-07-11 MED ORDER — ONDANSETRON HCL 4 MG/2ML IJ SOLN
4.0000 mg | Freq: Once | INTRAMUSCULAR | Status: AC
  Administered 2019-07-11: 4 mg via INTRAVENOUS

## 2019-07-11 MED ORDER — ACETAMINOPHEN 650 MG RE SUPP: 650.0000 mg | Freq: Four times a day (QID) | RECTAL | Status: DC | PRN

## 2019-07-11 MED ORDER — HYDRALAZINE HCL 25 MG PO TABS
25.0000 mg | ORAL_TABLET | Freq: Three times a day (TID) | ORAL | Status: DC
  Administered 2019-07-12 – 2019-07-14 (×7): 25 mg via ORAL
  Filled 2019-07-11 (×8): qty 1

## 2019-07-11 MED ORDER — TRAZODONE HCL 50 MG PO TABS: 25.0000 mg | ORAL_TABLET | Freq: Every evening | ORAL | Status: DC | PRN

## 2019-07-11 MED ORDER — ATORVASTATIN CALCIUM 20 MG PO TABS
40.0000 mg | ORAL_TABLET | Freq: Every day | ORAL | Status: DC
  Administered 2019-07-12 – 2019-07-14 (×3): 40 mg via ORAL
  Filled 2019-07-11 (×3): qty 2

## 2019-07-11 MED ORDER — ONDANSETRON HCL 4 MG/2ML IJ SOLN
INTRAMUSCULAR | Status: AC
  Administered 2019-07-11: 4 mg via INTRAVENOUS
  Filled 2019-07-11: qty 2

## 2019-07-11 NOTE — ED Notes (Signed)
Patient transported to CT 

## 2019-07-11 NOTE — ED Notes (Signed)
Pt requesting an updated on care progression and coffee, will ask md if pt may have coffee.

## 2019-07-11 NOTE — ED Notes (Signed)
X-ray at bedside

## 2019-07-11 NOTE — ED Notes (Signed)
Patient speaking with MRI tech at this time.

## 2019-07-11 NOTE — ED Notes (Signed)
Patient's son Molli Barrows given update on patient at this time.

## 2019-07-11 NOTE — ED Notes (Signed)
Patient given warm blankets and repositioned.

## 2019-07-11 NOTE — ED Notes (Signed)
Patient returned from CT

## 2019-07-11 NOTE — ED Triage Notes (Signed)
Pt arrives via EMS after falling out of bed on Friday and has not been out of floor since- pt is a dialysis pt and had a port on the left forearm- last dialysis Friday- pt complains of right sided shoulder and hip pain and lower back pain

## 2019-07-11 NOTE — ED Notes (Signed)
Patient resting quietly with eyes closed

## 2019-07-11 NOTE — ED Provider Notes (Signed)
[facility] Emergency Department Provider Note   ____________________________________________   First MD Initiated Contact with Patient 07/11/19 1657     (approximate)  I have reviewed the triage vital signs and the nursing notes.   HISTORY  Chief Complaint Fall  Chief complaint is fall  HPI Brian Fleming is a 73 y.o. male who reports he had dialysis on Friday had a little bit of Oreo cookie and some glass of water to drink and then went to lay down in bed and fell out of bed.  He has been laying on the floor ever since Friday.  Which is 3 days ago.  He has not had dialysis since then.  He complains of pain in his right shoulder and right abdomen.  Pain is worse with palpation and movement.  He does not have any pain in his head and does not think he hit it.  He does note any rib pain either.  There is minimal pain in the anterior superior iliac crest area but no pain in the hip or leg either.  Patient has not had his medicine since Friday either.        Past Medical History:  Diagnosis Date   CHF (congestive heart failure) ([place])    Dialysis patient [facility])    DJD (degenerative joint disease)    End stage renal disease on dialysis [facility])    Hyperlipidemia    Hypertension     Patient Active Problem List   Diagnosis Date Noted   HCAP (healthcare-associated pneumonia) 01/26/2019   Pneumonia 01/25/2019   End stage renal disease ([place]) 12/04/2017   Essential hypertension 12/04/2017   Hyperlipidemia 12/04/2017   Generalized weakness 04/11/2016   CHF (congestive heart failure) ([place]) 08/20/2015    Past Surgical History:  Procedure Laterality Date   AV FISTULA PLACEMENT     BACK SURGERY      Prior to Admission medications   Medication Sig Start Date End Date Taking? Authorizing Provider  amLODipine (NORVASC) 10 MG tablet Take 10 mg by mouth at bedtime. 04/29/14   [provider]  aspirin EC 81 MG tablet Take 1 tablet (81 mg  total) by mouth daily. 04/12/16   Demetrios Loll, MD  atorvastatin (LIPITOR) 40 MG tablet Take 1 tablet (40 mg total) by mouth daily at 6 PM. Patient not taking: Reported on 02/22/2017 04/12/16   Demetrios Loll, MD  B Complex-C-Folic Acid (RENA-VITE PO) Take 1 tablet by mouth daily. 10/27/14   [provider]  calcium carbonate (TUMS - DOSED IN MG ELEMENTAL CALCIUM) 500 MG chewable tablet Chew 2 tablets (400 mg of elemental calcium total) by mouth 3 (three) times daily. 08/22/15   Epifanio Lesches, MD  cefdinir (OMNICEF) 300 MG capsule Take 1 capsule (300 mg total) by mouth every other day. 01/28/19   Epifanio Lesches, MD  etodolac (LODINE) 200 MG capsule Take 1 capsule (200 mg total) by mouth every 8 (eight) hours. Patient not taking: Reported on 02/22/2017 01/27/17   Loney Hering, MD  gabapentin (NEURONTIN) 300 MG capsule Take 300 mg by mouth daily.     [provider]  hydrALAZINE (APRESOLINE) 25 MG tablet Take 1 tablet (25 mg total) by mouth 3 (three) times daily. Patient not taking: Reported on 01/25/2019 08/22/15   Epifanio Lesches, MD  predniSONE (STERAPRED UNI-PAK 21 TAB) 10 MG (21) TBPK tablet Taper by 10 mg daily 01/27/19   Epifanio Lesches, MD  ramipril (ALTACE) 10 MG capsule Take 10 mg by mouth  daily. 09/09/18   [provider]  ranitidine (ZANTAC) 150 MG capsule Take 150 mg by mouth 2 (two) times daily. 06/25/17   [provider]  sevelamer carbonate (RENVELA) 800 MG tablet Take 2,400 mg by mouth 3 (three) times daily. 06/08/18   [provider]  traMADol (ULTRAM) 50 MG tablet Take 100 mg by mouth 4 (four) times daily.     [provider]    Allergies Patient has no known allergies.  Family History  Problem Relation Age of Onset   Hypertension Brother     Social History Social History   Tobacco Use   Smoking status: Current Every Day Smoker    Packs/day: 0.25    Types: Cigarettes   Smokeless tobacco: Never Used    Substance Use Topics   Alcohol use: No   Drug use: No    Review of Systems  Constitutional: No fever/chills Eyes: No visual changes. ENT: No sore throat. Cardiovascular: Denies chest pain. Respiratory: Denies shortness of breath. Gastrointestinal:  abdominal pain.  No nausea, no vomiting.  No diarrhea.  No constipation. Genitourinary: Negative for dysuria. Musculoskeletal: Negative for back pain. Skin: Negative for rash. Neurological: Negative for headaches, focal weakness   ____________________________________________   PHYSICAL EXAM:  VITAL SIGNS: ED Triage Vitals  Enc Vitals Group     BP      Pulse      Resp      Temp      Temp src      SpO2      Weight      Height      Head Circumference      Peak Flow      Pain Score      Pain Loc      Pain Edu?      Excl. in [place]?     Constitutional: Alert and oriented.  Complaining of pain as noted in HPI Eyes: Conjunctivae are normal. Head: Atraumatic. Nose: No congestion/rhinnorhea. Mouth/Throat: Mucous membranes are moist.  Neck: No stridor.  Cardiovascular: Normal rate, regular rhythm. Grossly normal heart sounds.  Good peripheral circulation. Respiratory: Normal respiratory effort.  No retractions. Lungs CTAB. Gastrointestinal: Soft tender to palpation in the right side of the abdomen especially in the upper abdomen no distention. No abdominal bruits. No CVA tenderness. Musculoskeletal: No lower extremity tenderness nor edema.  There is tenderness in the superior anterior pelvis.  Not in the hip on the right side. Neurologic:  Normal speech and language. No gross focal neurologic deficits are appreciated. No gait instability. Skin:  Skin is warm, dry and intact. No rash noted.  No evidence of pressure ulcers.   ____________________________________________   LABS (all labs ordered are listed, but only abnormal results are displayed)  Labs Reviewed  COMPREHENSIVE METABOLIC PANEL - Abnormal; Notable for the  following components:      Result Value   Chloride 93 (*)    BUN 39 (*)    Creatinine, Ser 9.98 (*)    Calcium 8.0 (*)    GFR calc non Af Amer 5 (*)    GFR calc Af Amer 5 (*)    Anion gap 18 (*)    All other components within normal limits  BRAIN NATRIURETIC PEPTIDE - Abnormal; Notable for the following components:   B Natriuretic Peptide 737.0 (*)    All other components within normal limits  CBC WITH DIFFERENTIAL/PLATELET - Abnormal; Notable for the following components:   RBC 3.89 (*)    Hemoglobin  11.9 (*)    HCT 36.4 (*)    All other components within normal limits  CK - Abnormal; Notable for the following components:   Total CK 661 (*)    All other components within normal limits  TROPONIN I (HIGH SENSITIVITY) - Abnormal; Notable for the following components:   Troponin I (High Sensitivity) 54 (*)    All other components within normal limits  TROPONIN I (HIGH SENSITIVITY) - Abnormal; Notable for the following components:   Troponin I (High Sensitivity) 55 (*)    All other components within normal limits  SARS CORONAVIRUS 2 (HOSPITAL ORDER, [place] [facility])  LIPASE, BLOOD  LACTIC ACID, PLASMA   ____________________________________________  EKG   ____________________________________________  RADIOLOGY  ED MD interpretation:   Official radiology report(s): Dg Chest 1 View  Result Date: 07/11/2019 CLINICAL DATA:  Fall EXAM: CHEST  1 VIEW COMPARISON:  January 25, 2019 FINDINGS: The heart size is enlarged. Aortic calcifications are noted. Vascular calcifications are noted. There is vascular congestion without overt pulmonary edema. There are few airspace opacities at the lung bases bilaterally favored to represent atelectasis with an infiltrate not entirely excluded. There is no pneumothorax. There is no definite acute osseous abnormality. IMPRESSION: 1. No acute cardiopulmonary process. 2. Cardiomegaly with vascular congestion. 3. Subtle bibasilar  airspace opacities favored to represent atelectasis or scarring. An infiltrate is not excluded. Electronically Signed   By: Constance Holster M.D.   On: 07/11/2019 17:52   Dg Shoulder Right  Result Date: 07/11/2019 CLINICAL DATA:  Fall with shoulder pain EXAM: RIGHT SHOULDER - 2+ VIEW COMPARISON:  None. FINDINGS: There is widening of the acromioclavicular interval to 15 mm. There is irregularity at the inferior margin of the glenoid fossa. No fracture of the humerus. IMPRESSION: Widening of the acromioclavicular interval to 15 mm. Possible fracture of the inferior margin of the glenoid fossa. CT of the shoulder may be helpful for further characterization. Electronically Signed   By: Ulyses Jarred M.D.   On: 07/11/2019 19:54   Ct Head Wo Contrast  Result Date: 07/11/2019 CLINICAL DATA:  73 year old male with fall out of bed. EXAM: CT HEAD WITHOUT CONTRAST CT CERVICAL SPINE WITHOUT CONTRAST TECHNIQUE: Multidetector CT imaging of the head and cervical spine was performed following the standard protocol without intravenous contrast. Multiplanar CT image reconstructions of the cervical spine were also generated. COMPARISON:  Head CT dated 02/22/2017 FINDINGS: CT HEAD FINDINGS Brain: There is mild age-related atrophy and chronic microvascular ischemic changes. 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 There is no acute intracranial hemorrhage. No mass effect or midline shift. No extra-axial fluid collection. Vascular: No hyperdense vessel or unexpected calcification. Skull: Normal. Negative for fracture or focal lesion. Sinuses/Orbits: Mild mucoperiosteal thickening of paranasal sinuses. No air-fluid levels. Other: Scattered areas of scalp calcification. Small scalp hematoma over the vertex. CT CERVICAL SPINE FINDINGS Alignment: No acute subluxation. There is mild reversal of normal cervical lordosis which may be positional or due to muscle spasm or secondary to  degenerative changes. Skull base and vertebrae: No acute fracture. Osteopenia. Soft tissues and spinal canal: No prevertebral fluid or swelling. No visible canal hematoma. Disc levels: Extensive multilevel degenerative changes with endplate irregularities and disc space narrowing. Degenerative changes of the odontoid and atlanto odontoid space. Multilevel facet arthropathy most prominent involving the C1-C2 lateral processes. Upper chest: Negative. Other: Atherosclerotic calcification of the aortic arch as well as bilateral carotid bulb calcified plaques. IMPRESSION: 1.  No acute intracranial hemorrhage. 2. Mild age-related atrophy and chronic microvascular ischemic changes. 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 3. No acute/traumatic cervical spine pathology. Extensive degenerative changes. Electronically Signed   By: Anner Crete M.D.   On: 07/11/2019 21:02   Ct Cervical Spine Wo Contrast  Result Date: 07/11/2019 CLINICAL DATA:  73 year old male with fall out of bed. EXAM: CT HEAD WITHOUT CONTRAST CT CERVICAL SPINE WITHOUT CONTRAST TECHNIQUE: Multidetector CT imaging of the head and cervical spine was performed following the standard protocol without intravenous contrast. Multiplanar CT image reconstructions of the cervical spine were also generated. COMPARISON:  Head CT dated 02/22/2017 FINDINGS: CT HEAD FINDINGS Brain: There is mild age-related atrophy and chronic microvascular ischemic changes. 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 There is no acute intracranial hemorrhage. No mass effect or midline shift. No extra-axial fluid collection. Vascular: No hyperdense vessel or unexpected calcification. Skull: Normal. Negative for fracture or focal lesion. Sinuses/Orbits: Mild mucoperiosteal thickening of paranasal sinuses. No air-fluid levels. Other: Scattered areas of scalp calcification. Small scalp hematoma over the vertex. CT  CERVICAL SPINE FINDINGS Alignment: No acute subluxation. There is mild reversal of normal cervical lordosis which may be positional or due to muscle spasm or secondary to degenerative changes. Skull base and vertebrae: No acute fracture. Osteopenia. Soft tissues and spinal canal: No prevertebral fluid or swelling. No visible canal hematoma. Disc levels: Extensive multilevel degenerative changes with endplate irregularities and disc space narrowing. Degenerative changes of the odontoid and atlanto odontoid space. Multilevel facet arthropathy most prominent involving the C1-C2 lateral processes. Upper chest: Negative. Other: Atherosclerotic calcification of the aortic arch as well as bilateral carotid bulb calcified plaques. IMPRESSION: 1. No acute intracranial hemorrhage. 2. Mild age-related atrophy and chronic microvascular ischemic changes. 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 3. No acute/traumatic cervical spine pathology. Extensive degenerative changes. Electronically Signed   By: Anner Crete M.D.   On: 07/11/2019 21:02   Ct Abdomen Pelvis W Contrast  Result Date: 07/11/2019 CLINICAL DATA:  73 year old who states he fell out of bed and laid on the floor for the past 3 days. Patient complains of RIGHT UPPER QUADRANT abdominal pain, RIGHT shoulder pain, RIGHT hip pain and low back pain. Initial encounter. Current history of end-stage renal disease on hemodialysis. EXAM: CT ABDOMEN AND PELVIS WITH CONTRAST TECHNIQUE: Multidetector CT imaging of the abdomen and pelvis was performed using the standard protocol following bolus administration of intravenous contrast. CONTRAST:  149mL OMNIPAQUE IOHEXOL 300 MG/ML IV. COMPARISON:  CTA abdomen and pelvis 01/27/2017. CT abdomen and pelvis 07/20/2010. FINDINGS: Respiratory motion blurred images of the lung bases and upper abdomen. Lower chest: Expected dependent atelectasis posteriorly in the lower lobes. Visualized lung bases  otherwise clear. Heart moderately enlarged. Mitral annular calcification. Hepatobiliary: Liver normal in size and appearance. Gallstones in the gallbladder, the largest measuring approximately 8 mm. Small polyps versus invaginating folds in the gallbladder wall. No pericholecystic edema or inflammation. No biliary ductal dilation. Pancreas: Mildly dilated main pancreatic duct up to approximately 6 mm diameter, unchanged since the 2018 CTA. No pancreatic masses. Spleen: Calcified granuloma in the UPPER pole. Normal in size without significant parenchymal abnormality. Adrenals/Urinary Tract: Normal appearing adrenal glands. Polycystic kidney disease of chronic hemodialysis as noted previously. Minimally enhancing functional renal parenchyma bilaterally. No solid renal masses. Urinary bladder chronically decompressed accounting for its thickened wall. Stomach/Bowel: Small hiatal hernia. Stomach otherwise normal in appearance. Normal-appearing small bowel. Moderate  stool burden throughout the colon. Extensive diffuse colonic diverticulosis without evidence of acute diverticulitis. Normal appendix in the RIGHT mid abdomen. Vascular/Lymphatic: Severe aortoiliofemoral atherosclerosis without evidence of aneurysm. Severe visceral artery atherosclerosis. No pathologic lymphadenopathy. Reproductive: Moderate prostate gland enlargement. Normal seminal vesicles. Calcification of the vasa deferens. Other: Haziness/edema involving the retroperitoneum from the approximate level of the celiac trunk to the level of the aortic bifurcation. Musculoskeletal: Loss of the cortical margins of the endplates adjacent to the T9-10 disc, new since the prior CTA. Generalized osteosclerosis indicating renal osteodystrophy. Severe degenerative disc disease at L4-5. Severe diffuse facet degenerative changes involving the lumbar spine. Severe degenerative changes involving the hip joints. IMPRESSION: 1. Haziness/edema involving the retroperitoneum  from the approximate level of the celiac trunk to the level of the aortic bifurcation. Retroperitoneal fibrosis is suspected, possibly in the early acute inflammatory stages. 2. Loss of the cortical margins of the endplates adjacent to the T9-10 disc, query discitis/osteomyelitis. MRI of the thoracolumbar spine may be helpful in further evaluation to confirm or deny this finding. 3. Extensive diffuse colonic diverticulosis without evidence of acute diverticulitis. 4. Cholelithiasis without CT evidence of acute cholecystitis. Small polyps versus involuting folds in the gallbladder wall. No biliary ductal dilation. 5. Small hiatal hernia. 6. Generalized osteosclerosis indicating renal osteodystrophy. 7. Moderate prostate gland enlargement. Aortic Atherosclerosis (ICD10-170.0) Electronically Signed   By: Evangeline Dakin M.D.   On: 07/11/2019 21:03    ____________________________________________   PROCEDURES  Procedure(s) performed (including Critical Care):  Procedures   ____________________________________________   INITIAL IMPRESSION / ASSESSMENT AND PLAN / ED COURSE Tyquon Near was evaluated in Emergency Department on 07/11/2019 for the symptoms described in the history of present illness. He was evaluated in the context of the global COVID-19 pandemic, which necessitated consideration that the patient might be at risk for infection with the SARS-CoV-2 virus that causes COVID-19. Institutional protocols and algorithms that pertain to the evaluation of patients at risk for COVID-19 are in a state of rapid change based on information released by regulatory bodies including the CDC and federal and state organizations. These policies and algorithms were followed during the patient's care in the ED.    Patient with haziness on CT scan over the spine but could represent retroperitoneal fibrosis.  Additionally he may have discitis.  MRI is pending to evaluate this.  Patient fell out of bed and laid  there for 3 days.  He has an elevated troponin that will need ruling out.  Is going up slightly.  He has some congestive failure and will need dialysis tomorrow.  Additionally we need to work him up for the retroperitoneal possible fibrosis and a possible discitis also seen on CT scan.  This can best be accomplished in the hospital.         ____________________________________________   FINAL CLINICAL IMPRESSION(S) / ED DIAGNOSES  Final diagnoses:  Fall, initial encounter  Shoulder fracture, right, closed, initial encounter  Acromioclavicular joint separation, right, initial encounter  Elevated troponin  Elevated CK  Midline back pain, unspecified back location, unspecified chronicity  Congestive heart failure, unspecified HF chronicity, unspecified heart failure type [facility])     ED Discharge Orders    None       Note:  This document was prepared using Dragon voice recognition software and may include unintentional dictation errors.    Nena Polio, MD 07/11/19 [phone]

## 2019-07-12 ENCOUNTER — Other Ambulatory Visit: Payer: Self-pay

## 2019-07-12 ENCOUNTER — Inpatient Hospital Stay
Admit: 2019-07-12 | Discharge: 2019-07-12 | Disposition: A | Discharge status: Home / Self Care | Payer: Medicare Other | Attending: Family Medicine | Admitting: Family Medicine

## 2019-07-12 ENCOUNTER — Encounter: Payer: Self-pay | Admitting: Physician Assistant

## 2019-07-12 ENCOUNTER — Inpatient Hospital Stay: Admit: 2019-07-12 | Payer: Medicare Other

## 2019-07-12 DIAGNOSIS — Z992 Dependence on renal dialysis: Secondary | ICD-10-CM

## 2019-07-12 DIAGNOSIS — N186 End stage renal disease: Secondary | ICD-10-CM

## 2019-07-12 DIAGNOSIS — R7989 Other specified abnormal findings of blood chemistry: Secondary | ICD-10-CM

## 2019-07-12 DIAGNOSIS — I361 Nonrheumatic tricuspid (valve) insufficiency: Secondary | ICD-10-CM

## 2019-07-12 DIAGNOSIS — I484 Atypical atrial flutter: Secondary | ICD-10-CM

## 2019-07-12 LAB — CBC
HCT: 32.7 % — ABNORMAL LOW (ref 39.0–52.0)
Hemoglobin: 10.6 g/dL — ABNORMAL LOW (ref 13.0–17.0)
MCH: 30.7 pg (ref 26.0–34.0)
MCHC: 32.4 g/dL (ref 30.0–36.0)
MCV: 94.8 fL (ref 80.0–100.0)
Platelets: 147 10*3/uL — ABNORMAL LOW (ref 150–400)
RBC: 3.45 MIL/uL — ABNORMAL LOW (ref 4.22–5.81)
RDW: 13.4 % (ref 11.5–15.5)
WBC: 4.3 10*3/uL (ref 4.0–10.5)
nRBC: 0 % (ref 0.0–0.2)

## 2019-07-12 LAB — RENAL FUNCTION PANEL
Albumin: 3.5 g/dL (ref 3.5–5.0)
Anion gap: 15 (ref 5–15)
BUN: 46 mg/dL — ABNORMAL HIGH (ref 8–23)
CO2: 28 mmol/L (ref 22–32)
Calcium: 7.7 mg/dL — ABNORMAL LOW (ref 8.9–10.3)
Chloride: 96 mmol/L — ABNORMAL LOW (ref 98–111)
Creatinine, Ser: 10.92 mg/dL — ABNORMAL HIGH (ref 0.61–1.24)
GFR calc Af Amer: 5 mL/min — ABNORMAL LOW (ref >60)
GFR calc non Af Amer: 4 mL/min — ABNORMAL LOW (ref >60)
Glucose, Bld: 129 mg/dL — ABNORMAL HIGH (ref 70–99)
Phosphorus: 7 mg/dL — ABNORMAL HIGH (ref 2.5–4.6)
Potassium: 4.4 mmol/L (ref 3.5–5.1)
Sodium: 139 mmol/L (ref 135–145)

## 2019-07-12 LAB — BRAIN NATRIURETIC PEPTIDE: B Natriuretic Peptide: 730 pg/mL — ABNORMAL HIGH (ref 0.0–100.0)

## 2019-07-12 LAB — MRSA PCR SCREENING: MRSA by PCR: NEGATIVE

## 2019-07-12 LAB — PROTIME-INR
INR: 1.2 (ref 0.8–1.2)
Prothrombin Time: 15.2 seconds (ref 11.4–15.2)

## 2019-07-12 LAB — TROPONIN I (HIGH SENSITIVITY): Troponin I (High Sensitivity): 64 ng/L — ABNORMAL HIGH (ref <18)

## 2019-07-12 MED ORDER — CHLORHEXIDINE GLUCONATE CLOTH 2 % EX PADS
6.0000 | MEDICATED_PAD | Freq: Every day | CUTANEOUS | Status: DC
  Administered 2019-07-14: 05:00:00 6 via TOPICAL

## 2019-07-12 MED ORDER — TRAMADOL HCL 50 MG PO TABS
100.0000 mg | ORAL_TABLET | Freq: Two times a day (BID) | ORAL | Status: DC
  Administered 2019-07-12 – 2019-07-14 (×5): 100 mg via ORAL
  Filled 2019-07-12 (×5): qty 2

## 2019-07-12 MED ORDER — FUROSEMIDE 10 MG/ML IJ SOLN
40.0000 mg | Freq: Two times a day (BID) | INTRAMUSCULAR | Status: DC
  Administered 2019-07-12 – 2019-07-13 (×2): 40 mg via INTRAVENOUS
  Filled 2019-07-12 (×2): qty 4

## 2019-07-12 MED ORDER — RAMIPRIL 10 MG PO CAPS
10.0000 mg | ORAL_CAPSULE | Freq: Every day | ORAL | Status: DC
  Administered 2019-07-12 – 2019-07-13 (×2): 10 mg via ORAL
  Filled 2019-07-12 (×3): qty 1

## 2019-07-12 MED ORDER — APIXABAN 5 MG PO TABS
5.0000 mg | ORAL_TABLET | Freq: Two times a day (BID) | ORAL | Status: DC
  Administered 2019-07-12 – 2019-07-14 (×5): 5 mg via ORAL
  Filled 2019-07-12 (×5): qty 1

## 2019-07-12 MED ORDER — HEPARIN SODIUM (PORCINE) 5000 UNIT/ML IJ SOLN
5000.0000 [IU] | Freq: Two times a day (BID) | INTRAMUSCULAR | Status: DC
  Administered 2019-07-12: 5000 [IU] via SUBCUTANEOUS
  Filled 2019-07-12: qty 1

## 2019-07-12 NOTE — Progress Notes (Signed)
[facility] [place] NAME: Brian Fleming    MR#:  [id number]  DATE OF BIRTH:  1946/05/13  SUBJECTIVE:  CHIEF COMPLAINT:   Chief Complaint  Patient presents with  . Fall   Came after the fall and unable to get up off the floor for 1 to 2 days. No new complaints. REVIEW OF SYSTEMS:  CONSTITUTIONAL: No fever,have fatigue or weakness.  EYES: No blurred or double vision.  EARS, NOSE, AND THROAT: No tinnitus or ear pain.  RESPIRATORY: No cough, shortness of breath, wheezing or hemoptysis.  CARDIOVASCULAR: No chest pain, orthopnea, edema.  GASTROINTESTINAL: No nausea, vomiting, diarrhea or abdominal pain.  GENITOURINARY: No dysuria, hematuria.  ENDOCRINE: No polyuria, nocturia,  HEMATOLOGY: No anemia, easy bruising or bleeding SKIN: No rash or lesion. MUSCULOSKELETAL: No joint pain or arthritis.   NEUROLOGIC: No tingling, numbness, weakness.  PSYCHIATRY: No anxiety or depression.   ROS  DRUG ALLERGIES:  No Known Allergies  VITALS:  Blood pressure 126/68, pulse 68, temperature 98.6 F (37 C), temperature source Oral, resp. rate 16, height 5\' 11"  (1.803 m), weight 84.7 kg, SpO2 93 %.  PHYSICAL EXAMINATION:  GENERAL:  73 y.o.-year-old patient lying in the bed with no acute distress.  EYES: Pupils equal, round, reactive to light and accommodation. No scleral icterus. Extraocular muscles intact.  HEENT: Head atraumatic, normocephalic. Oropharynx and nasopharynx clear.  NECK:  Supple, no jugular venous distention. No thyroid enlargement, no tenderness.  LUNGS: Normal breath sounds bilaterally, no wheezing, rales,rhonchi or crepitation. No use of accessory muscles of respiration.  CARDIOVASCULAR: S1, S2 normal. No murmurs, rubs, or gallops.  ABDOMEN: Soft, nontender, nondistended. Bowel sounds present. No organomegaly or mass.  EXTREMITIES: No pedal edema, cyanosis, or clubbing.  NEUROLOGIC: Cranial nerves II through XII are intact.  Muscle strength 3/5 in all extremities. Sensation intact. Gait not checked.  PSYCHIATRIC: The patient is alert and oriented x 2-3.  SKIN: No obvious rash, lesion, or ulcer.   Physical Exam LABORATORY PANEL:   CBC Recent Labs  Lab 07/12/19 0223  WBC 4.3  HGB 10.6*  HCT 32.7*  PLT 147*   ------------------------------------------------------------------------------------------------------------------  Chemistries  Recent Labs  Lab 07/11/19 1711 07/12/19 0223  NA 138 139  K 4.5 4.4  CL 93* 96*  CO2 27 28  GLUCOSE 75 129*  BUN 39* 46*  CREATININE 9.98* 10.92*  CALCIUM 8.0* 7.7*  AST 19  --   ALT 9  --   ALKPHOS 75  --   BILITOT 1.0  --    ------------------------------------------------------------------------------------------------------------------  Cardiac Enzymes No results for input(s): TROPONINI in the last 168 hours. ------------------------------------------------------------------------------------------------------------------  RADIOLOGY:  Dg Chest 1 View  Result Date: 07/11/2019 CLINICAL DATA:  Fall EXAM: CHEST  1 VIEW COMPARISON:  January 25, 2019 FINDINGS: The heart size is enlarged. Aortic calcifications are noted. Vascular calcifications are noted. There is vascular congestion without overt pulmonary edema. There are few airspace opacities at the lung bases bilaterally favored to represent atelectasis with an infiltrate not entirely excluded. There is no pneumothorax. There is no definite acute osseous abnormality. IMPRESSION: 1. No acute cardiopulmonary process. 2. Cardiomegaly with vascular congestion. 3. Subtle bibasilar airspace opacities favored to represent atelectasis or scarring. An infiltrate is not excluded. Electronically Signed   By: Constance Holster M.D.   On: 07/11/2019 17:52   Dg Shoulder Right  Result Date: 07/11/2019 CLINICAL DATA:  Fall with shoulder pain EXAM: RIGHT SHOULDER - 2+ VIEW COMPARISON:  None. FINDINGS:  There is widening of  the acromioclavicular interval to 15 mm. There is irregularity at the inferior margin of the glenoid fossa. No fracture of the humerus. IMPRESSION: Widening of the acromioclavicular interval to 15 mm. Possible fracture of the inferior margin of the glenoid fossa. CT of the shoulder may be helpful for further characterization. Electronically Signed   By: Ulyses Jarred M.D.   On: 07/11/2019 19:54   Ct Head Wo Contrast  Result Date: 07/11/2019 CLINICAL DATA:  73 year old male with fall out of bed. EXAM: CT HEAD WITHOUT CONTRAST CT CERVICAL SPINE WITHOUT CONTRAST TECHNIQUE: Multidetector CT imaging of the head and cervical spine was performed following the standard protocol without intravenous contrast. Multiplanar CT image reconstructions of the cervical spine were also generated. COMPARISON:  Head CT dated 02/22/2017 FINDINGS: CT HEAD FINDINGS Brain: There is mild age-related atrophy and chronic microvascular ischemic changes. 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 There is no acute intracranial hemorrhage. No mass effect or midline shift. No extra-axial fluid collection. Vascular: No hyperdense vessel or unexpected calcification. Skull: Normal. Negative for fracture or focal lesion. Sinuses/Orbits: Mild mucoperiosteal thickening of paranasal sinuses. No air-fluid levels. Other: Scattered areas of scalp calcification. Small scalp hematoma over the vertex. CT CERVICAL SPINE FINDINGS Alignment: No acute subluxation. There is mild reversal of normal cervical lordosis which may be positional or due to muscle spasm or secondary to degenerative changes. Skull base and vertebrae: No acute fracture. Osteopenia. Soft tissues and spinal canal: No prevertebral fluid or swelling. No visible canal hematoma. Disc levels: Extensive multilevel degenerative changes with endplate irregularities and disc space narrowing. Degenerative changes of the odontoid and atlanto odontoid space.  Multilevel facet arthropathy most prominent involving the C1-C2 lateral processes. Upper chest: Negative. Other: Atherosclerotic calcification of the aortic arch as well as bilateral carotid bulb calcified plaques. IMPRESSION: 1. No acute intracranial hemorrhage. 2. Mild age-related atrophy and chronic microvascular ischemic changes. 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 3. No acute/traumatic cervical spine pathology. Extensive degenerative changes. Electronically Signed   By: Anner Crete M.D.   On: 07/11/2019 21:02   Ct Cervical Spine Wo Contrast  Result Date: 07/11/2019 CLINICAL DATA:  73 year old male with fall out of bed. EXAM: CT HEAD WITHOUT CONTRAST CT CERVICAL SPINE WITHOUT CONTRAST TECHNIQUE: Multidetector CT imaging of the head and cervical spine was performed following the standard protocol without intravenous contrast. Multiplanar CT image reconstructions of the cervical spine were also generated. COMPARISON:  Head CT dated 02/22/2017 FINDINGS: CT HEAD FINDINGS Brain: There is mild age-related atrophy and chronic microvascular ischemic changes. 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 There is no acute intracranial hemorrhage. No mass effect or midline shift. No extra-axial fluid collection. Vascular: No hyperdense vessel or unexpected calcification. Skull: Normal. Negative for fracture or focal lesion. Sinuses/Orbits: Mild mucoperiosteal thickening of paranasal sinuses. No air-fluid levels. Other: Scattered areas of scalp calcification. Small scalp hematoma over the vertex. CT CERVICAL SPINE FINDINGS Alignment: No acute subluxation. There is mild reversal of normal cervical lordosis which may be positional or due to muscle spasm or secondary to degenerative changes. Skull base and vertebrae: No acute fracture. Osteopenia. Soft tissues and spinal canal: No prevertebral fluid or swelling. No visible canal hematoma. Disc  levels: Extensive multilevel degenerative changes with endplate irregularities and disc space narrowing. Degenerative changes of the odontoid and atlanto odontoid space. Multilevel facet arthropathy most prominent involving the C1-C2 lateral processes. Upper chest:  Negative. Other: Atherosclerotic calcification of the aortic arch as well as bilateral carotid bulb calcified plaques. IMPRESSION: 1. No acute intracranial hemorrhage. 2. Mild age-related atrophy and chronic microvascular ischemic changes. 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 3. No acute/traumatic cervical spine pathology. Extensive degenerative changes. Electronically Signed   By: Anner Crete M.D.   On: 07/11/2019 21:02   Mr Thoracic Spine Wo Contrast  Result Date: 07/11/2019 CLINICAL DATA:  T9-10 abnormality. Recent fall with back pain. In stage renal disease. EXAM: MRI THORACIC AND LUMBAR SPINE WITHOUT CONTRAST TECHNIQUE: Multiplanar and multiecho pulse sequences of the thoracic and lumbar spine were obtained without intravenous contrast. COMPARISON:  CT chest 01/25/2019, CT abdomen pelvis 07/11/2019. FINDINGS: MRI THORACIC SPINE FINDINGS Alignment:  Physiologic. Vertebrae: No acute fracture. There is abnormal hyperintense T2-weighted signal at the endplates T9 and Z61 with mild edema of the disc space. T1-weighted signal is predominantly normal. 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 Cord:  Normal signal and morphology. Paraspinal and other soft tissues: Numerous bilateral renal cysts. There is a small amount of prevertebral fluid at the T8-T11 levels. Disc levels: And T9-10, there is a small disc bulge the narrows the ventral thecal sac. Otherwise, no visible disc herniation. No spinal canal stenosis. MRI LUMBAR SPINE FINDINGS Segmentation:  Standard. Alignment:  Physiologic. Vertebrae: Heterogeneous bone marrow signal. There are type 2 Modic degenerative  endplate signal changes at L4-L5. Conus medullaris and cauda equina: Conus extends to the L1 level. Conus and cauda equina appear normal. Paraspinal and other soft tissues: Diffuse cystic change of the kidneys. Disc levels: T12-L1: Normal. L1-2: Normal. L2-3: Moderate diffuse disc bulge with severe spinal canal stenosis and severe bilateral neural foraminal stenosis. L3-4: Small disc bulge with mild spinal canal stenosis. No neural foraminal stenosis. L4-5: Disc bulge and endplate spurring with severe facet hypertrophy. Severe spinal canal and bilateral neural foraminal stenosis. L5-S1: Mild facet hypertrophy.  No stenosis. IMPRESSION: 1. 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 Infection is considered unlikely given the minimal change since 01/25/2019. 2. 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 Electronically Signed   By: Ulyses Jarred M.D.   On: 07/11/2019 23:43   Mr Lumbar Spine Wo Contrast  Result Date: 07/11/2019 CLINICAL DATA:  T9-10 abnormality. Recent fall with back pain. In stage renal disease. EXAM: MRI THORACIC AND LUMBAR SPINE WITHOUT CONTRAST TECHNIQUE: Multiplanar and multiecho pulse sequences of the thoracic and lumbar spine were obtained without intravenous contrast. COMPARISON:  CT chest 01/25/2019, CT abdomen pelvis 07/11/2019. FINDINGS: MRI THORACIC SPINE FINDINGS Alignment:  Physiologic. Vertebrae: No acute fracture. There is abnormal hyperintense T2-weighted signal at the endplates T9 and W96 with mild edema of the disc space. T1-weighted signal is predominantly normal. 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 Cord:  Normal signal and morphology. Paraspinal and other soft tissues: Numerous bilateral renal cysts. There is a small amount of prevertebral fluid at the T8-T11 levels.  Disc levels: And T9-10, there is a small disc bulge the narrows the ventral thecal sac. Otherwise, no visible disc herniation. No spinal canal stenosis. MRI LUMBAR SPINE FINDINGS Segmentation:  Standard. Alignment:  Physiologic. Vertebrae: Heterogeneous bone marrow signal. There are type 2 Modic degenerative endplate signal changes at L4-L5. Conus medullaris and cauda equina: Conus extends to the L1 level. Conus and cauda equina appear normal. Paraspinal  and other soft tissues: Diffuse cystic change of the kidneys. Disc levels: T12-L1: Normal. L1-2: Normal. L2-3: Moderate diffuse disc bulge with severe spinal canal stenosis and severe bilateral neural foraminal stenosis. L3-4: Small disc bulge with mild spinal canal stenosis. No neural foraminal stenosis. L4-5: Disc bulge and endplate spurring with severe facet hypertrophy. Severe spinal canal and bilateral neural foraminal stenosis. L5-S1: Mild facet hypertrophy.  No stenosis. IMPRESSION: 1. 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 Infection is considered unlikely given the minimal change since 01/25/2019. 2. 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 Electronically Signed   By: Ulyses Jarred M.D.   On: 07/11/2019 23:43   Ct Abdomen Pelvis W Contrast  Result Date: 07/11/2019 CLINICAL DATA:  73 year old who states he fell out of bed and laid on the floor for the past 3 days. Patient complains of RIGHT UPPER QUADRANT abdominal pain, RIGHT shoulder pain, RIGHT hip pain and low back pain. Initial encounter. Current history of end-stage renal disease on hemodialysis. EXAM: CT ABDOMEN AND PELVIS WITH CONTRAST TECHNIQUE: Multidetector CT imaging of the abdomen and pelvis was performed using the standard protocol following bolus administration of intravenous contrast. CONTRAST:  168mL OMNIPAQUE IOHEXOL 300  MG/ML IV. COMPARISON:  CTA abdomen and pelvis 01/27/2017. CT abdomen and pelvis 07/20/2010. FINDINGS: Respiratory motion blurred images of the lung bases and upper abdomen. Lower chest: Expected dependent atelectasis posteriorly in the lower lobes. Visualized lung bases otherwise clear. Heart moderately enlarged. Mitral annular calcification. Hepatobiliary: Liver normal in size and appearance. Gallstones in the gallbladder, the largest measuring approximately 8 mm. Small polyps versus invaginating folds in the gallbladder wall. No pericholecystic edema or inflammation. No biliary ductal dilation. Pancreas: Mildly dilated main pancreatic duct up to approximately 6 mm diameter, unchanged since the 2018 CTA. No pancreatic masses. Spleen: Calcified granuloma in the UPPER pole. Normal in size without significant parenchymal abnormality. Adrenals/Urinary Tract: Normal appearing adrenal glands. Polycystic kidney disease of chronic hemodialysis as noted previously. Minimally enhancing functional renal parenchyma bilaterally. No solid renal masses. Urinary bladder chronically decompressed accounting for its thickened wall. Stomach/Bowel: Small hiatal hernia. Stomach otherwise normal in appearance. Normal-appearing small bowel. Moderate stool burden throughout the colon. Extensive diffuse colonic diverticulosis without evidence of acute diverticulitis. Normal appendix in the RIGHT mid abdomen. Vascular/Lymphatic: Severe aortoiliofemoral atherosclerosis without evidence of aneurysm. Severe visceral artery atherosclerosis. No pathologic lymphadenopathy. Reproductive: Moderate prostate gland enlargement. Normal seminal vesicles. Calcification of the vasa deferens. Other: Haziness/edema involving the retroperitoneum from the approximate level of the celiac trunk to the level of the aortic bifurcation. Musculoskeletal: Loss of the cortical margins of the endplates adjacent to the T9-10 disc, new since the prior CTA. Generalized  osteosclerosis indicating renal osteodystrophy. Severe degenerative disc disease at L4-5. Severe diffuse facet degenerative changes involving the lumbar spine. Severe degenerative changes involving the hip joints. IMPRESSION: 1. Haziness/edema involving the retroperitoneum from the approximate level of the celiac trunk to the level of the aortic bifurcation. Retroperitoneal fibrosis is suspected, possibly in the early acute inflammatory stages. 2. Loss of the cortical margins of the endplates adjacent to the T9-10 disc, query discitis/osteomyelitis. MRI of the thoracolumbar spine may be helpful in further evaluation to confirm or deny this finding. 3. Extensive diffuse colonic diverticulosis without evidence of acute diverticulitis. 4. Cholelithiasis without CT evidence of acute cholecystitis. Small polyps versus involuting folds in the gallbladder wall. No biliary ductal dilation. 5.  Small hiatal hernia. 6. Generalized osteosclerosis indicating renal osteodystrophy. 7. Moderate prostate gland enlargement. Aortic Atherosclerosis (ICD10-170.0) Electronically Signed   By: Evangeline Dakin M.D.   On: 07/11/2019 21:03    ASSESSMENT AND PLAN:   Active Problems:   Fall  1.  Fall.  This could have been a mechanical fall or accidental mild rolling during his sleep.  He has no subsequent injuries.   - physical therapy consultation to assist his ambulation since he has been on the floor since Friday.  suggest SNF placement. - His head CT scan without contrast came back negative.  - We will follow neuro checks every 4 hours for 24 hours  2.  Elevated troponin I.   - follow serial troponin levels and obtain a 2D echo and a cardiology consult.   - Demand ischemia- no further work ups.  3.  End-stage renal disease on hemodialysis. -Nephrology consultation  -This could be contributing to fluid overload that gave rise to his elevated BNP. -He could be having mild acute on chronic CHF, possibly diastolic.  Will  diurese him with IV Lasix pending nephrology evaluation. - He will be due for hemodialysis on Monday sooner if needed.  At this time he does not have significant clinical CHF symptoms.  4.  Back pain.  I believe that this is chronic.  The finding on the CT was suspicious for possible T9-T 10 discitis however MRI ruled it out as mentioned above.  Pain management will be provided.  5.  Hypertension.  We will continue Altace and amlodipine.  6.  Dyslipidemia.  Continue statin therapy.  7.  DVT prophylaxis.  Subtenons heparin.  8. A fib- per cardio started on eliquis, Rate is controlled.   All the records are reviewed and case discussed with Care Management/Social Workerr. Management plans discussed with the patient, family and they are in agreement.  CODE STATUS: Full.  TOTAL TIME TAKING CARE OF THIS PATIENT: 35 minutes.     POSSIBLE D/C IN 1-2 DAYS, DEPENDING ON CLINICAL CONDITION.   Vaughan Basta M.D on 07/12/2019   Between 7am to 6pm - Pager - [phone]  After 6pm go to [email or web address] - password [email or web address] [facility]  Office  [phone]  CC: Primary care physician; System, Pcp Not In  Note: This dictation was prepared with Dragon dictation along with smaller phrase technology. Any transcriptional errors that result from this process are unintentional.

## 2019-07-12 NOTE — Progress Notes (Signed)
Post-HD Assessment:    07/12/19 1645  Neurological  Level of Consciousness Alert  Orientation Level Oriented X4  Respiratory  Respiratory Pattern Regular;Unlabored  Chest Assessment Chest expansion symmetrical  Bilateral Breath Sounds Clear;Diminished  Cardiac  Pulse Regular  ECG Monitor Yes  Cardiac Rhythm  (Vent rhythm)  Antiarrhythmic device No  Vascular  R Radial Pulse +2  L Radial Pulse +2  Psychosocial  Psychosocial (WDL) WDL   Pt tolerated Tx well, VS stable throughout, no complaints reported.

## 2019-07-12 NOTE — H&P (Addendum)
[facility] NAME: Shalik Sanfilippo    MR#:  [id number]  DATE OF BIRTH:  [birth year]  DATE OF ADMISSION:  07/11/2019  PRIMARY CARE PHYSICIAN: Marden Noble, MD   REQUESTING/REFERRING PHYSICIAN: Conni Slipper, MD  CHIEF COMPLAINT:   Chief Complaint  Patient presents with   Fall    HISTORY OF PRESENT ILLNESS:  Nickalus Thornsberry  is a 73 y.o. African-American male with a known history of end-stage renal disease on hemodialysis on Monday Wednesday and Friday, CHF, dyslipidemia, hypertension and DJD, who presented to the emergency room with acute onset of fall from his bed.  The patient stated that he went to sleep and was dreaming and the next thing he found himself on the floor.  He denied presyncope or syncope since he was asleep.  He admitted to mild dizziness and headache after falling.  He denied any paresthesias or focal muscle weakness.  He stated on the floor from Friday until her son came to visit him on Sunday.  When he did not respond apparently son called EMS.  Patient admitted to vomiting once and denied any dyspnea or cough or wheezing or chest pain.  He has been having mid back pain since he had a surgery in his back in [facility] per his report has not significantly increased lately but it has been tender to touch.  No fever or chills.  No worsening lower extremity edema, orthopnea or paroxysmal nocturnal dyspnea or palpitations.  No abdominal pain or melena or bright red bleeding per rectum.  Upon presentation to the emergency room, his blood pressure was elevated 190/110 2 pulse of 113 respiratory to 21.  Blood pressure was later on down to 162/85 and later 136/90 with a temperature of 97.4.  Mild hypokalemia and elevated BUN and creatinine at 39/9.98 with calcium of 8 and a gap of 18 and CO2 27.  BNP was 736 and CK was 661 with a high-sensitivity troponin I of 54 and later 55.  Lactic acid was 1.7 CBC showed anemia slightly better  than previous levels in February.  COVID-19 test came back negative.  Abdominal pelvic CT scan revealed haziness/edema in the retroperitoneum could be related to retroperitoneal fibrosis possibly in the early acute inflammatory stage and it showed loss of cortical margins in the endplates adjacent to the T9- T10 disc, possibly discitis or osteomyelitis with recommendation for MRI.  It showed diverticulosis without diverticulitis, cholelithiasis without cholecystitis and small polyps in the gallbladder with no biliary dilatation.  It showed small hiatal hernia and renal osteodystrophy as well as moderate prostate enlargement and aortic atherosclerosis.Marland Kitchen  Head CT scan without contrast showed chronic microvascular ischemic changes and small old infarcts and encephalomalacia in cerebellar hemispheres with no acute intracranial hemorrhage and C-spine CT showed no acute/traumatic C-spine pathology with extensive degenerative changes.  MRI of the TL spine revealed: 1. 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 Infection is considered unlikely given the minimal change since 01/25/2019. 2. 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  The patient was given 4 mg IV Zofran in the emergency room.  He will be admitted to a medical monitored bed for further evaluation and management.   PAST MEDICAL HISTORY:   Past Medical History:  Diagnosis Date   CHF (congestive heart failure) ([place])    Dialysis patient ([place])    DJD (  degenerative joint disease)    End stage renal disease on dialysis ([place])    Hyperlipidemia    Hypertension     PAST SURGICAL HISTORY:   Past Surgical History:  Procedure Laterality Date   AV FISTULA PLACEMENT     BACK SURGERY      SOCIAL HISTORY:   Social History   Tobacco Use   Smoking status: Current Every  Day Smoker    Packs/day: 0.25    Types: Cigarettes   Smokeless tobacco: Never Used  Substance Use Topics   Alcohol use: No    FAMILY HISTORY:   Family History  Problem Relation Age of Onset   Hypertension Brother     DRUG ALLERGIES:  No Known Allergies  REVIEW OF SYSTEMS:   ROS As per history of present illness. All pertinent systems were reviewed above. Constitutional,  HEENT, cardiovascular, respiratory, GI, GU, musculoskeletal, neuro, psychiatric, endocrine,  integumentary and hematologic systems were reviewed and are otherwise  negative/unremarkable except for positive findings mentioned above in the HPI.   MEDICATIONS AT HOME:   Prior to Admission medications   Medication Sig Start Date End Date Taking? Authorizing Provider  amLODipine (NORVASC) 10 MG tablet Take 10 mg by mouth at bedtime. 04/29/14   [provider]  aspirin EC 81 MG tablet Take 1 tablet (81 mg total) by mouth daily. 04/12/16   Demetrios Loll, MD  atorvastatin (LIPITOR) 40 MG tablet Take 1 tablet (40 mg total) by mouth daily at 6 PM. Patient not taking: Reported on 02/22/2017 04/12/16   Demetrios Loll, MD  B Complex-C-Folic Acid (RENA-VITE PO) Take 1 tablet by mouth daily. 10/27/14   [provider]  calcium carbonate (TUMS - DOSED IN MG ELEMENTAL CALCIUM) 500 MG chewable tablet Chew 2 tablets (400 mg of elemental calcium total) by mouth 3 (three) times daily. 08/22/15   Epifanio Lesches, MD  cefdinir (OMNICEF) 300 MG capsule Take 1 capsule (300 mg total) by mouth every other day. 01/28/19   Epifanio Lesches, MD  etodolac (LODINE) 200 MG capsule Take 1 capsule (200 mg total) by mouth every 8 (eight) hours. Patient not taking: Reported on 02/22/2017 01/27/17   Loney Hering, MD  gabapentin (NEURONTIN) 300 MG capsule Take 300 mg by mouth daily.     [provider]  hydrALAZINE (APRESOLINE) 25 MG tablet Take 1 tablet (25 mg total) by mouth 3 (three) times daily. Patient not taking:  Reported on 01/25/2019 08/22/15   Epifanio Lesches, MD  predniSONE (STERAPRED UNI-PAK 21 TAB) 10 MG (21) TBPK tablet Taper by 10 mg daily 01/27/19   Epifanio Lesches, MD  ramipril (ALTACE) 10 MG capsule Take 10 mg by mouth daily. 09/09/18   [provider]  ranitidine (ZANTAC) 150 MG capsule Take 150 mg by mouth 2 (two) times daily. 06/25/17   [provider]  sevelamer carbonate (RENVELA) 800 MG tablet Take 2,400 mg by mouth 3 (three) times daily. 06/08/18   [provider]  traMADol (ULTRAM) 50 MG tablet Take 100 mg by mouth 4 (four) times daily.     [provider]      VITAL SIGNS:  Blood pressure (!) 135/95, pulse 92, resp. rate (!) 21, height 5\' 11"  (1.803 m), weight 56.2 kg, SpO2 90 %.  PHYSICAL EXAMINATION:  Physical Exam  GENERAL:  73 y.o.-year-old African-American male patient lying in the bed with no acute distress.  EYES: Pupils equal, round, reactive to light and accommodation. No scleral icterus. Extraocular muscles intact.  HEENT:  Head atraumatic, normocephalic. Oropharynx and nasopharynx clear.  NECK:  Supple, no jugular venous distention. No thyroid enlargement, no tenderness.  LUNGS: Normal breath sounds bilaterally, no wheezing, rales,rhonchi or crepitation. No use of accessory muscles of respiration.  CARDIOVASCULAR: Regular rate and rhythm, S1, S2 normal. No murmurs, rubs, or gallops.  ABDOMEN: Soft, nondistended, nontender. Bowel sounds present. No organomegaly or mass.  EXTREMITIES: No pedal edema, cyanosis, or clubbing.  NEUROLOGIC: Cranial nerves II through XII are intact. Muscle strength 5/5 in all extremities. Sensation intact. Gait not checked. Musculoskeletal: He had mid back spine tenderness T9/T10. PSYCHIATRIC: The patient is alert and oriented x 3.  Normal affect and good eye contact. SKIN: No obvious rash, lesion, or ulcer.   LABORATORY PANEL:   CBC Recent Labs  Lab 07/11/19 1711  WBC 4.3  HGB 11.9*  HCT 36.4*    PLT 168   ------------------------------------------------------------------------------------------------------------------  Chemistries  Recent Labs  Lab 07/11/19 1711  NA 138  K 4.5  CL 93*  CO2 27  GLUCOSE 75  BUN 39*  CREATININE 9.98*  CALCIUM 8.0*  AST 19  ALT 9  ALKPHOS 75  BILITOT 1.0   ------------------------------------------------------------------------------------------------------------------  Cardiac Enzymes No results for input(s): TROPONINI in the last 168 hours. ------------------------------------------------------------------------------------------------------------------  RADIOLOGY:  Dg Chest 1 View  Result Date: 07/11/2019 CLINICAL DATA:  Fall EXAM: CHEST  1 VIEW COMPARISON:  January 25, 2019 FINDINGS: The heart size is enlarged. Aortic calcifications are noted. Vascular calcifications are noted. There is vascular congestion without overt pulmonary edema. There are few airspace opacities at the lung bases bilaterally favored to represent atelectasis with an infiltrate not entirely excluded. There is no pneumothorax. There is no definite acute osseous abnormality. IMPRESSION: 1. No acute cardiopulmonary process. 2. Cardiomegaly with vascular congestion. 3. Subtle bibasilar airspace opacities favored to represent atelectasis or scarring. An infiltrate is not excluded. Electronically Signed   By: Constance Holster M.D.   On: 07/11/2019 17:52   Dg Shoulder Right  Result Date: 07/11/2019 CLINICAL DATA:  Fall with shoulder pain EXAM: RIGHT SHOULDER - 2+ VIEW COMPARISON:  None. FINDINGS: There is widening of the acromioclavicular interval to 15 mm. There is irregularity at the inferior margin of the glenoid fossa. No fracture of the humerus. IMPRESSION: Widening of the acromioclavicular interval to 15 mm. Possible fracture of the inferior margin of the glenoid fossa. CT of the shoulder may be helpful for further characterization. Electronically Signed   By: Ulyses Jarred M.D.   On: 07/11/2019 19:54   Ct Head Wo Contrast  Result Date: 07/11/2019 CLINICAL DATA:  73 year old male with fall out of bed. EXAM: CT HEAD WITHOUT CONTRAST CT CERVICAL SPINE WITHOUT CONTRAST TECHNIQUE: Multidetector CT imaging of the head and cervical spine was performed following the standard protocol without intravenous contrast. Multiplanar CT image reconstructions of the cervical spine were also generated. COMPARISON:  Head CT dated 02/22/2017 FINDINGS: CT HEAD FINDINGS Brain: There is mild age-related atrophy and chronic microvascular ischemic changes. 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 There is no acute intracranial hemorrhage. No mass effect or midline shift. No extra-axial fluid collection. Vascular: No hyperdense vessel or unexpected calcification. Skull: Normal. Negative for fracture or focal lesion. Sinuses/Orbits: Mild mucoperiosteal thickening of paranasal sinuses. No air-fluid levels. Other: Scattered areas of scalp calcification. Small scalp hematoma over the vertex. CT CERVICAL SPINE FINDINGS Alignment: No acute subluxation. There is mild reversal of normal cervical lordosis which may be positional or due to muscle  spasm or secondary to degenerative changes. Skull base and vertebrae: No acute fracture. Osteopenia. Soft tissues and spinal canal: No prevertebral fluid or swelling. No visible canal hematoma. Disc levels: Extensive multilevel degenerative changes with endplate irregularities and disc space narrowing. Degenerative changes of the odontoid and atlanto odontoid space. Multilevel facet arthropathy most prominent involving the C1-C2 lateral processes. Upper chest: Negative. Other: Atherosclerotic calcification of the aortic arch as well as bilateral carotid bulb calcified plaques. IMPRESSION: 1. No acute intracranial hemorrhage. 2. Mild age-related atrophy and chronic microvascular ischemic changes. 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 3. No acute/traumatic cervical spine pathology. Extensive degenerative changes. Electronically Signed   By: Anner Crete M.D.   On: 07/11/2019 21:02   Ct Cervical Spine Wo Contrast  Result Date: 07/11/2019 CLINICAL DATA:  73 year old male with fall out of bed. EXAM: CT HEAD WITHOUT CONTRAST CT CERVICAL SPINE WITHOUT CONTRAST TECHNIQUE: Multidetector CT imaging of the head and cervical spine was performed following the standard protocol without intravenous contrast. Multiplanar CT image reconstructions of the cervical spine were also generated. COMPARISON:  Head CT dated 02/22/2017 FINDINGS: CT HEAD FINDINGS Brain: There is mild age-related atrophy and chronic microvascular ischemic changes. 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 There is no acute intracranial hemorrhage. No mass effect or midline shift. No extra-axial fluid collection. Vascular: No hyperdense vessel or unexpected calcification. Skull: Normal. Negative for fracture or focal lesion. Sinuses/Orbits: Mild mucoperiosteal thickening of paranasal sinuses. No air-fluid levels. Other: Scattered areas of scalp calcification. Small scalp hematoma over the vertex. CT CERVICAL SPINE FINDINGS Alignment: No acute subluxation. There is mild reversal of normal cervical lordosis which may be positional or due to muscle spasm or secondary to degenerative changes. Skull base and vertebrae: No acute fracture. Osteopenia. Soft tissues and spinal canal: No prevertebral fluid or swelling. No visible canal hematoma. Disc levels: Extensive multilevel degenerative changes with endplate irregularities and disc space narrowing. Degenerative changes of the odontoid and atlanto odontoid space. Multilevel facet arthropathy most prominent involving the C1-C2 lateral processes. Upper chest: Negative. Other: Atherosclerotic calcification of the aortic arch as well as bilateral  carotid bulb calcified plaques. IMPRESSION: 1. No acute intracranial hemorrhage. 2. Mild age-related atrophy and chronic microvascular ischemic changes. 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 3. No acute/traumatic cervical spine pathology. Extensive degenerative changes. Electronically Signed   By: Anner Crete M.D.   On: 07/11/2019 21:02   Mr Thoracic Spine Wo Contrast  Result Date: 07/11/2019 CLINICAL DATA:  T9-10 abnormality. Recent fall with back pain. In stage renal disease. EXAM: MRI THORACIC AND LUMBAR SPINE WITHOUT CONTRAST TECHNIQUE: Multiplanar and multiecho pulse sequences of the thoracic and lumbar spine were obtained without intravenous contrast. COMPARISON:  CT chest 01/25/2019, CT abdomen pelvis 07/11/2019. FINDINGS: MRI THORACIC SPINE FINDINGS Alignment:  Physiologic. Vertebrae: No acute fracture. There is abnormal hyperintense T2-weighted signal at the endplates T9 and Y86 with mild edema of the disc space. T1-weighted signal is predominantly normal. 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 Cord:  Normal signal and morphology. Paraspinal and other soft tissues: Numerous bilateral renal cysts. There is a small amount of prevertebral fluid at the T8-T11 levels. Disc levels: And T9-10, there is a small disc bulge the narrows the ventral thecal sac. Otherwise, no visible disc herniation. No spinal canal stenosis. MRI LUMBAR SPINE FINDINGS Segmentation:  Standard. Alignment:  Physiologic. Vertebrae: Heterogeneous bone marrow signal. There are type  2 Modic degenerative endplate signal changes at L4-L5. Conus medullaris and cauda equina: Conus extends to the L1 level. Conus and cauda equina appear normal. Paraspinal and other soft tissues: Diffuse cystic change of the kidneys. Disc levels: T12-L1: Normal. L1-2: Normal. L2-3: Moderate diffuse disc bulge with severe spinal canal stenosis and severe bilateral neural  foraminal stenosis. L3-4: Small disc bulge with mild spinal canal stenosis. No neural foraminal stenosis. L4-5: Disc bulge and endplate spurring with severe facet hypertrophy. Severe spinal canal and bilateral neural foraminal stenosis. L5-S1: Mild facet hypertrophy.  No stenosis. IMPRESSION: 1. 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 Infection is considered unlikely given the minimal change since 01/25/2019. 2. 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 Electronically Signed   By: Ulyses Jarred M.D.   On: 07/11/2019 23:43   Mr Lumbar Spine Wo Contrast  Result Date: 07/11/2019 CLINICAL DATA:  T9-10 abnormality. Recent fall with back pain. In stage renal disease. EXAM: MRI THORACIC AND LUMBAR SPINE WITHOUT CONTRAST TECHNIQUE: Multiplanar and multiecho pulse sequences of the thoracic and lumbar spine were obtained without intravenous contrast. COMPARISON:  CT chest 01/25/2019, CT abdomen pelvis 07/11/2019. FINDINGS: MRI THORACIC SPINE FINDINGS Alignment:  Physiologic. Vertebrae: No acute fracture. There is abnormal hyperintense T2-weighted signal at the endplates T9 and Y60 with mild edema of the disc space. T1-weighted signal is predominantly normal. 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 Cord:  Normal signal and morphology. Paraspinal and other soft tissues: Numerous bilateral renal cysts. There is a small amount of prevertebral fluid at the T8-T11 levels. Disc levels: And T9-10, there is a small disc bulge the narrows the ventral thecal sac. Otherwise, no visible disc herniation. No spinal canal stenosis. MRI LUMBAR SPINE FINDINGS Segmentation:  Standard. Alignment:  Physiologic. Vertebrae: Heterogeneous bone marrow signal. There are type 2 Modic degenerative endplate signal changes at L4-L5. Conus  medullaris and cauda equina: Conus extends to the L1 level. Conus and cauda equina appear normal. Paraspinal and other soft tissues: Diffuse cystic change of the kidneys. Disc levels: T12-L1: Normal. L1-2: Normal. L2-3: Moderate diffuse disc bulge with severe spinal canal stenosis and severe bilateral neural foraminal stenosis. L3-4: Small disc bulge with mild spinal canal stenosis. No neural foraminal stenosis. L4-5: Disc bulge and endplate spurring with severe facet hypertrophy. Severe spinal canal and bilateral neural foraminal stenosis. L5-S1: Mild facet hypertrophy.  No stenosis. IMPRESSION: 1. 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 Infection is considered unlikely given the minimal change since 01/25/2019. 2. 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 Electronically Signed   By: Ulyses Jarred M.D.   On: 07/11/2019 23:43   Ct Abdomen Pelvis W Contrast  Result Date: 07/11/2019 CLINICAL DATA:  73 year old who states he fell out of bed and laid on the floor for the past 3 days. Patient complains of RIGHT UPPER QUADRANT abdominal pain, RIGHT shoulder pain, RIGHT hip pain and low back pain. Initial encounter. Current history of end-stage renal disease on hemodialysis. EXAM: CT ABDOMEN AND PELVIS WITH CONTRAST TECHNIQUE: Multidetector CT imaging of the abdomen and pelvis was performed using the standard protocol following bolus administration of intravenous contrast. CONTRAST:  135mL OMNIPAQUE IOHEXOL 300 MG/ML IV. COMPARISON:  CTA abdomen and pelvis 01/27/2017. CT abdomen and pelvis 07/20/2010. FINDINGS: Respiratory motion  blurred images of the lung bases and upper abdomen. Lower chest: Expected dependent atelectasis posteriorly in the lower lobes. Visualized lung bases otherwise clear. Heart moderately enlarged. Mitral annular calcification.  Hepatobiliary: Liver normal in size and appearance. Gallstones in the gallbladder, the largest measuring approximately 8 mm. Small polyps versus invaginating folds in the gallbladder wall. No pericholecystic edema or inflammation. No biliary ductal dilation. Pancreas: Mildly dilated main pancreatic duct up to approximately 6 mm diameter, unchanged since the 2018 CTA. No pancreatic masses. Spleen: Calcified granuloma in the UPPER pole. Normal in size without significant parenchymal abnormality. Adrenals/Urinary Tract: Normal appearing adrenal glands. Polycystic kidney disease of chronic hemodialysis as noted previously. Minimally enhancing functional renal parenchyma bilaterally. No solid renal masses. Urinary bladder chronically decompressed accounting for its thickened wall. Stomach/Bowel: Small hiatal hernia. Stomach otherwise normal in appearance. Normal-appearing small bowel. Moderate stool burden throughout the colon. Extensive diffuse colonic diverticulosis without evidence of acute diverticulitis. Normal appendix in the RIGHT mid abdomen. Vascular/Lymphatic: Severe aortoiliofemoral atherosclerosis without evidence of aneurysm. Severe visceral artery atherosclerosis. No pathologic lymphadenopathy. Reproductive: Moderate prostate gland enlargement. Normal seminal vesicles. Calcification of the vasa deferens. Other: Haziness/edema involving the retroperitoneum from the approximate level of the celiac trunk to the level of the aortic bifurcation. Musculoskeletal: Loss of the cortical margins of the endplates adjacent to the T9-10 disc, new since the prior CTA. Generalized osteosclerosis indicating renal osteodystrophy. Severe degenerative disc disease at L4-5. Severe diffuse facet degenerative changes involving the lumbar spine. Severe degenerative changes involving the hip joints. IMPRESSION: 1. Haziness/edema involving the retroperitoneum from the approximate level of the celiac trunk to the level of the aortic  bifurcation. Retroperitoneal fibrosis is suspected, possibly in the early acute inflammatory stages. 2. Loss of the cortical margins of the endplates adjacent to the T9-10 disc, query discitis/osteomyelitis. MRI of the thoracolumbar spine may be helpful in further evaluation to confirm or deny this finding. 3. Extensive diffuse colonic diverticulosis without evidence of acute diverticulitis. 4. Cholelithiasis without CT evidence of acute cholecystitis. Small polyps versus involuting folds in the gallbladder wall. No biliary ductal dilation. 5. Small hiatal hernia. 6. Generalized osteosclerosis indicating renal osteodystrophy. 7. Moderate prostate gland enlargement. Aortic Atherosclerosis (ICD10-170.0) Electronically Signed   By: Evangeline Dakin M.D.   On: 07/11/2019 21:03      IMPRESSION AND PLAN:   1.  Fall.  This could have been a mechanical fall or accidental mild rolling during his sleep.  He has no subsequent injuries.   -We will still obtain a physical therapy consultation to assist his ambulation since he has been on the floor since Friday.   -His head CT scan without contrast came back negative.  - We will follow neuro checks every 4 hours for 24 hours  2.  Elevated troponin I.   -Will follow serial troponin levels and obtain a 2D echo and a cardiology consult in a.m.   - I notified Dr. Franchot Gallo and Dr. Caryl Comes regarding the consult.  3.  End-stage renal disease on hemodialysis. -Nephrology consultation with Dr. Candiss Norse will be obtained.  I notified him regarding the consult. -This could be contributing to fluid overload that gave rise to his elevated BNP. -He could be having mild acute on chronic CHF, possibly diastolic.  Will diurese him with IV Lasix pending nephrology evaluation. -He will be due for hemodialysis on Monday sooner if needed.  At this time he does not have significant clinical CHF symptoms.  4.  Back pain.  I believe that this  is chronic.  The finding on the CT was  suspicious for possible T9-T 10 discitis however MRI ruled it out as mentioned above.  Pain management will be provided.  5.  Hypertension.  We will continue Altace and amlodipine.  6.  Dyslipidemia.  Continue statin therapy.  7.  DVT prophylaxis.  Subtenons heparin.   All the records are reviewed and case discussed with ED provider. The plan of care was discussed in details with the patient (and family). I answered all questions. The patient agreed to proceed with the above mentioned plan. Further management will depend upon hospital course.   CODE STATUS: Full code  TOTAL TIME TAKING CARE OF THIS PATIENT: 55 minutes.    Christel Mormon M.D on 07/12/2019 at 12:05 AM  Pager - [phone]  After 6pm go to [email or web address] - Proofreader  [facility]  Office  [phone]  CC: Primary care physician; Marden Noble, MD   Note: This dictation was prepared with Dragon dictation along with smaller phrase technology. Any transcriptional errors that result from this process are unintentional.

## 2019-07-12 NOTE — Plan of Care (Signed)
  Problem: Education: Goal: Knowledge of General Education information will improve Description: Including pain rating scale, medication(s)/side effects and non-pharmacologic comfort measures Outcome: Progressing   Problem: Clinical Measurements: Goal: Diagnostic test results will improve Outcome: Progressing   Problem: Activity: Goal: Risk for activity intolerance will decrease Outcome: Progressing   Problem: Safety: Goal: Ability to remain free from injury will improve Outcome: Progressing   Problem: Skin Integrity: Goal: Risk for impaired skin integrity will decrease Outcome: Progressing   

## 2019-07-12 NOTE — Progress Notes (Signed)
HD Tx Completed:     07/12/19 1630  Vital Signs  Temp 98.4 F (36.9 C)  Temp Source Oral  Pulse Rate 80  Pulse Rate Source Monitor  Resp 16  BP 135/69  BP Location Right Arm  BP Method Automatic  Patient Position (if appropriate) Lying  Oxygen Therapy  SpO2 100 %  O2 Device Room Air  Pain Assessment  Pain Scale 0-10  Pain Score 0  Dialysis Weight  Weight  (unable to weigh)  Type of Weight Post-Dialysis (unable to weigh)  During Hemodialysis Assessment  Blood Flow Rate (mL/min) 400 mL/min  Arterial Pressure (mmHg) -220 mmHg  Venous Pressure (mmHg) 160 mmHg  Transmembrane Pressure (mmHg) 70 mmHg  Ultrafiltration Rate (mL/min) 570 mL/min  Dialysate Flow Rate (mL/min) 800 ml/min  Conductivity: Machine  14  HD Safety Checks Performed Yes  Intra-Hemodialysis Comments Tx completed

## 2019-07-12 NOTE — TOC Initial Note (Signed)
Transition of Care [facility]) - Initial/Assessment Note    Patient Details  Name: Brian Fleming MRN: [id number] Date of Birth: 06-30-1946  Transition of Care [facility]) CM/SW Contact:    Latanya Maudlin, RN Phone Number: 07/12/2019, 10:24 AM  Clinical Narrative:  [facility] team met with patient to discuss possible disposition needs. It was noted that patient suffered from a fall and that he was there for three days before being found. Patient unsure how this happened, per him he is typically mobile and able to ambulate independently. He does use a walker for long distances. Patient tells me he thinks he had home health up until about three weeks ago but "no one has come since". I have listed several agencies to him and he is unfamiliar with whom he may have used. I have reached out to most agencies in the area and they are not familiar with Mr Crofford. With that said, I provided Corene Cornea with home health referral pending orders and if Corene Cornea is able to see that patient is active with another agency we will use them for resumption. PT is still pending but patient feels he will likely do better with home health but is willing to follow PT recommendations. He has a son who provides some transport and assistance as needed. Patient is HD on M,W,F at [facility]. Notified Dell Ponto, Dialysis liaison with patient pathways of admission. Patient uses [facility] for transport. PCP is Agricultural consultant.               Expected Discharge Plan: [place] Barriers to Discharge: Continued Medical Work up   Patient Goals and CMS Choice   CMS Medicare.gov Compare Post Acute Care list provided to:: Patient Choice offered to / list presented to : Patient  Expected Discharge Plan and Services Expected Discharge Plan: [place]   Discharge Planning Services: CM Consult Post Acute Care Choice: [place] arrangements for the past 2 months: [place]  Agency: [facility] ([place]) Date [place]: 07/12/19 Time [place]: 1024 Representative spoke with at [place]: [place] Arrangements/Services Living arrangements for the past 2 months: [place] with:: Self Patient language and need for interpreter reviewed:: Yes Do you feel safe going back to the place where you live?: Yes      Need for Family Participation in Patient Care: No (Comment)   Current home services: DME Criminal Activity/Legal Involvement Pertinent to Current Situation/Hospitalization: No - Comment as needed  Activities of Daily Living Home Assistive Devices/Equipment: Shower chair with back, Raised toilet seat with rails, Cane (specify quad or straight), Walker (specify type), Dentures (specify type) ADL Screening (condition at time of admission) Patient's cognitive ability adequate to safely complete daily activities?: Yes Is the patient deaf or have difficulty hearing?: Yes Does the patient have difficulty seeing, even when wearing glasses/contacts?: No Does the patient have difficulty concentrating, remembering, or making decisions?: No Patient able to express need for assistance with ADLs?: Yes Does the patient have difficulty dressing or bathing?: No Independently performs ADLs?: Yes (appropriate for developmental age) Does the patient have difficulty walking or climbing stairs?: Yes Weakness of Legs: Both Weakness of Arms/Hands: None  Permission Sought/Granted Permission sought to share information with : Case Manager  Emotional Assessment Appearance:: Appears stated age Attitude/Demeanor/Rapport: Gracious, Engaged Affect (typically observed): Accepting Orientation: : Oriented to Self, Oriented to Place, Oriented to  Time, Oriented to Situation      Admission diagnosis:  Trauma [T14.90XA] Elevated troponin [R79.89] Elevated CK [R74.8] Fall, initial encounter  [W19.XXXA] Acromioclavicular joint separation, right, initial encounter [F41.443Q] Shoulder fracture, right, closed, initial encounter [S42.91XA] Midline back pain, unspecified back location, unspecified chronicity [M54.89] Congestive heart failure, unspecified HF chronicity, unspecified heart failure type [facility]) [I50.9] Patient Active Problem List   Diagnosis Date Noted  . Fall 07/11/2019  . HCAP (healthcare-associated pneumonia) 01/26/2019  . Pneumonia 01/25/2019  . End stage renal disease ([place]) 12/04/2017  . Essential hypertension 12/04/2017  . Hyperlipidemia 12/04/2017  . Generalized weakness 04/11/2016  . CHF (congestive heart failure) ([place]) 08/20/2015   PCP:  Marden Noble, MD Pharmacy:   [place], [place] - [address] [place] [address] Phone: [phone] Fax: [phone]  [facility] ([facility]) - [place], [address] [place] [address] Phone: [phone] Fax: [phone]     Social Determinants of Health ([place]) Interventions    Readmission Risk Interventions Readmission Risk Prevention Plan 07/12/2019  Transportation Screening Complete  PCP or Specialist Appt within 5-7 Days Complete  Home Care Screening Complete  Medication Review (RN CM) Complete  Some recent data might be hidden

## 2019-07-12 NOTE — Evaluation (Signed)
Physical Therapy Evaluation Patient Details Name: Brian Fleming MRN: [id number] DOB: 1946-08-20 Today's Date: 07/12/2019   History of Present Illness  Brian Fleming is a 73 y.o male who arrived at hospital ED on 07/11/2019 via EMS after falling out of bed 07/09/2019 and laying on the floor. HD port on left forearm. He was admited to the hospital for fall, elevated troponin, end-stage renal disease on hemodialysis, elevated BNP, mild acute on chronic CHF, and back pain (likely chronic). Cardiology consult sugessts troponin elevation is in setting of rhabdomyolysis/fall. PMH also includes hx of back surgery, atrial flutter, HTN. 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 ABnromal T9-10 endplate unklikely to be infection. 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 Negative for acute/traumatic c-spine pathology. X-ray of R shoulder shows AC joint separation and possible fx of inferior margin of the glenoid fossa. Chest radiograph show cardiomegaly with vascular congestin and possible atelectasis or scarring.    Clinical Impression  Patient reports that prior to hospitalization he lived in a private single story apartment with one step to enter by himself with intermittent assistance with IADLs. He ambulated independently in the home with no AD, to doctor's visit with a RW, and to dialysis 3x a week with a SPC. Upon physical therapy evaluation, patient was able to perform supine to sit mod I with extended time and effort. He performed sit <> stand to RW from bed adjusted to chair height with CGA - supervision, requiring several attempts due to poor forward shift and weakness. He ambulated 200 feet with CGA - supervision with an extremely stooped  posture and slow pace, failing to maintain feet within the RW which places him at a high risk of future falls. He also demo wide base of support and short step length. He groaned and moved slowly throughout treatment stating his legs, feet, and back hurt significantly. His HR remained WFL throughout session. Unable to obtain SpO2 after several attempts due to inability of pulse ox to read at his fingers. Did not show signs of respiratory distress. Patient appears to have experienced a significant decline in functional mobility and independence and would benefit from short term rehab prior to returning home. Patient would benefit fromphysical therapy to address impairments and functional limitations (see PT Problem List below) to work towards stated goals and return to PLOF or maximal functional independence.      Follow Up Recommendations SNF;Supervision for mobility/OOB    Equipment Recommendations  None recommended by PT    Recommendations for Other Services OT consult     Precautions / Restrictions Precautions Precautions: Fall Precaution Comments: possible R glenoid fx, R AC separation (no official precautions listed for shoulder) Restrictions Weight Bearing Restrictions: No      Mobility  Bed Mobility Overal bed mobility: Modified Independent             General bed mobility comments: Patient able to complete supine <> sit from flat bed with extra time and frequent stops.  Transfers Overall transfer level: Needs assistance Equipment used: Rolling walker (2 wheeled) Transfers: Sit to/from Stand Sit to Stand: Min guard         General transfer comment: Patient completed sit <> stand bed to bed and bed to chair using RW and CGA support. Required cuing for forward shift. Had several  failed attempts due to not shifting forward enough. Heavy use of L hand on RW to stand up. Demo good safety awareness when sitting to the chair. Lots of groaning and slow moving due to pain in  feet and back per pt.  Ambulation/Gait Ambulation/Gait assistance: Min guard;Supervision Gait Distance (Feet): 200 Feet Assistive device: Rolling walker (2 wheeled) Gait Pattern/deviations: Decreased stride length;Wide base of support;Trunk flexed Gait velocity: very slow.   General Gait Details: Patient ambulated ~ 200 feet with RW and CGA-SBA with very slow gait, short step length bilaterally, wide stance, and severely stooped posture. Patient failed to keep RW close to body and repeatedly allowed feet to get outside of the walker, especially with turns. Able to improve slightly for short periods of time with cuing. States back hurts when he attemts to stand up. Lots of groaning and slowest at the start, gradual increase in pace, confidence, and less groaning towards end of ambulation.  Stairs            Wheelchair Mobility    Modified Rankin (Stroke Patients Only)       Balance Overall balance assessment: Needs assistance Sitting-balance support: Feet supported;Bilateral upper extremity supported Sitting balance-Leahy Scale: Good Sitting balance - Comments: patient steady sitting at edge of bed.   Standing balance support: Bilateral upper extremity supported;During functional activity Standing balance-Leahy Scale: Poor Standing balance comment: Dependent on RW for standing balance. Extremely stooped forward and unable to stand up straight.                             Pertinent Vitals/Pain Pain Assessment: 0-10 Pain Score: 9  Pain Location: Patient reports 9/10 pain in middle of back at start of visit; he reports pain to touch and with weight bearing at bilateral feet. Pain Descriptors / Indicators: Grimacing Pain Intervention(s): Limited activity within patient's tolerance;Monitored during session;Repositioned    Home Living Family/patient expects to be discharged to:: Private residence(apartment) Living Arrangements: Alone Available Help at Discharge:  Available PRN/intermittently Type of Home: Apartment Home Access: Stairs to enter Entrance Stairs-Rails: None Entrance Stairs-Number of Steps: 1 Home Layout: One level Home Equipment: Cane - single point;Toilet riser;Walker - 2 wheels;Shower seat      Prior Function Level of Independence: Needs assistance   Gait / Transfers Assistance Needed: pt reports ambulates in home without AD, uses RW when going to doctor, [facility] when going to dialysis  ADL's / Homemaking Assistance Needed: states he is I with ADLs, requires help with some IADLs such as cleaning, transportation. States he cooks for himself.        Hand Dominance   Dominant Hand: Left    Extremity/Trunk Assessment   Upper Extremity Assessment Upper Extremity Assessment: Generalized weakness;RUE deficits/detail;LUE deficits/detail RUE Deficits / Details: pt did not complain of R shoulder pain. Able to resist push/pull approx 4/5 bilaterally. 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 Impaired fine motor control. RUE Coordination: decreased fine motor LUE Deficits / Details: Able to resist push/pull approx 4/5 bilaterally. 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 Impaired fine motor control. LUE Coordination: decreased fine motor    Lower Extremity Assessment Lower Extremity Assessment: Generalized weakness    Cervical / Trunk Assessment Cervical / Trunk Assessment: Kyphotic(significantly slumped and flexed forward in sitting/standing)  Communication   Communication: No difficulties  Cognition Arousal/Alertness: Awake/alert Behavior During Therapy: WFL for tasks assessed/performed Overall Cognitive Status: Within Functional Limits for tasks assessed  General Comments: slightly hard of hearing      General Comments      Exercises Other Exercises Other Exercises: Educated on proper use of RW during gait training, improved transfer  skills, purpose and importance of mobility.   Assessment/Plan    PT Assessment Patient needs continued PT services  PT Problem List Decreased strength;Decreased range of motion;Decreased mobility;Decreased activity tolerance;Decreased balance;Decreased knowledge of use of DME;Pain       PT Treatment Interventions DME instruction;Therapeutic activities;Gait training;Therapeutic exercise;Patient/family education;Balance training;Functional mobility training;Neuromuscular re-education    PT Goals (Current goals can be found in the Care Plan section)  Acute Rehab PT Goals Patient Stated Goal: return home PT Goal Formulation: With patient Time For Goal Achievement: 07/26/19 Potential to Achieve Goals: Fair    Frequency Min 2X/week   Barriers to discharge Decreased caregiver support patient lives alone and needs a higher level of supervision for safety during mobility activities.    Co-evaluation               AM-PAC PT "6 Clicks" Mobility  Outcome Measure Help needed turning from your back to your side while in a flat bed without using bedrails?: A Little Help needed moving from lying on your back to sitting on the side of a flat bed without using bedrails?: A Little Help needed moving to and from a bed to a chair (including a wheelchair)?: A Little Help needed standing up from a chair using your arms (e.g., wheelchair or bedside chair)?: A Little Help needed to walk in hospital room?: A Little Help needed climbing 3-5 steps with a railing? : Total 6 Click Score: 16    End of Session Equipment Utilized During Treatment: Gait belt Activity Tolerance: Patient tolerated treatment well;Patient limited by fatigue Patient left: in chair;with call bell/phone within reach;with chair alarm set;with nursing/sitter in room Nurse Communication: Mobility status PT Visit Diagnosis: Unsteadiness on feet (R26.81);Muscle weakness (generalized) (M62.81);History of falling (Z91.81);Difficulty  in walking, not elsewhere classified (R26.2);Other abnormalities of gait and mobility (R26.89);Pain Pain - Right/Left: (throughout body, especially in back and bilateral feet.) Pain - part of body: Ankle and joints of foot;Leg;Hand;Arm;Shoulder(back)    Time: 1120-1150 PT Time Calculation (min) (ACUTE ONLY): 30 min   Charges:   PT Evaluation $PT Eval Moderate Complexity: 1 Mod PT Treatments $Gait Training: 8-22 mins       Everlean Alstrom. Graylon Good, PT, DPT 07/12/19, 12:28 PM

## 2019-07-12 NOTE — Consult Note (Signed)
Cardiology Consultation:   Patient ID: Brian Fleming; [id number]; 1946/03/04   Admit date: 07/11/2019 Date of Consult: 07/12/2019  Primary Care Provider: Marden Noble, MD Primary Cardiologist: New to [facility] - consult by End   Patient Profile:   Brian Fleming is a 73 y.o. male with a hx of atrial flutter dating back to 2013 not on Staves, ESRD on HD MWF, PAD, prior stroke, anemia of chronic disease, HTN, HLD, and DJD who is being seen today for the evaluation of elevated hs-Tn at the request of Dr. Sidney Ace.  History of Present Illness:   [name] reports having been seen by a cardiologist years ago and being told his "heart was fine," and was "given 3 pills." Prior PET Myoview from 2015, as part of renal transplant evaluation read as probably normal with anterolateral defect felt to be artifact, EF 49%. Echo at that time showed an EF of 45%, LVH, mitral annular calcification, mild MR, biatrial dilatation, pericardial effusion. Review of old EKGs in Epic demonstrate the patient has been in atypical atrial flutter vs atrial tachycardia since approximately 07/2015 with first mention of atrial flutter being 2013 in [place].   Patient was in his usual state of health until the night of 07/09/2019 when he was having a dream that he was back in [place]. He was looking for dynamite. Upon finding the dynamite, he jumped in his dream. The next thing the patient remembers is hitting the floor in his bedroom. He denied any chest pain, SOB, or palpitations. He could not get up off the floor, so he went back to sleep there. He reported hitting his left heel on the footboard of the bed. He denies hitting his head on the floor. He remained on the floor all day on 7/18 and into the day on 07/11/2019 when his son arrived to find the patient on the floor. EMS was called and he was brought to [facility]. Vitals were stable. CT head was not acute with small old infarcts noted. Remainder of imaging as noted per  IM. He was noted to have a minimally elevated hs-Tn of 54 with a delta of 55. Further troponin was checked for unclear reasons with a value of 64. CK elevated 661. BNP 737. EKG showed atypical atrial flutter which he appears to have been in since 07/2015.   Past Medical History:  Diagnosis Date   Atrial flutter ([place])    a. dates back to at least 2013; b. CHADS2VASc 5 (HTN, age x 1, stroke x 2, vascular disease); c. started on Eliquis 06/2019   DJD (degenerative joint disease)    End stage renal disease on dialysis ([place])    Hyperlipidemia    Hypertension     Past Surgical History:  Procedure Laterality Date   AV FISTULA PLACEMENT     BACK SURGERY       Home Meds: Prior to Admission medications   Medication Sig Start Date End Date Taking? Authorizing Provider  amLODipine (NORVASC) 10 MG tablet Take 10 mg by mouth at bedtime. 04/29/14  Yes [provider]  atorvastatin (LIPITOR) 40 MG tablet Take 1 tablet (40 mg total) by mouth daily at 6 PM. 04/12/16  Yes Demetrios Loll, MD  gabapentin (NEURONTIN) 300 MG capsule Take 300 mg by mouth daily.    Yes [provider]  ramipril (ALTACE) 10 MG capsule Take 10 mg by mouth daily. 09/09/18  Yes [provider]  sevelamer carbonate (RENVELA) 800 MG tablet Take 2,400  mg by mouth 3 (three) times daily. 06/08/18  Yes [provider]  traMADol (ULTRAM) 50 MG tablet Take 100 mg by mouth 4 (four) times daily.    Yes [provider]  aspirin EC 81 MG tablet Take 1 tablet (81 mg total) by mouth daily. Patient not taking: Reported on 07/12/2019 04/12/16   Demetrios Loll, MD  B Complex-C-Folic Acid (RENA-VITE PO) Take 1 tablet by mouth daily. 10/27/14   [provider]  calcium carbonate (TUMS - DOSED IN MG ELEMENTAL CALCIUM) 500 MG chewable tablet Chew 2 tablets (400 mg of elemental calcium total) by mouth 3 (three) times daily. Patient not taking: Reported on 07/12/2019 08/22/15   Epifanio Lesches, MD  cefdinir  (OMNICEF) 300 MG capsule Take 1 capsule (300 mg total) by mouth every other day. Patient not taking: Reported on 07/12/2019 01/28/19   Epifanio Lesches, MD  etodolac (LODINE) 200 MG capsule Take 1 capsule (200 mg total) by mouth every 8 (eight) hours. Patient not taking: Reported on 02/22/2017 01/27/17   Loney Hering, MD  hydrALAZINE (APRESOLINE) 25 MG tablet Take 1 tablet (25 mg total) by mouth 3 (three) times daily. Patient not taking: Reported on 01/25/2019 08/22/15   Epifanio Lesches, MD  predniSONE (STERAPRED UNI-PAK 21 TAB) 10 MG (21) TBPK tablet Taper by 10 mg daily Patient not taking: Reported on 07/12/2019 01/27/19   Epifanio Lesches, MD  ranitidine (ZANTAC) 150 MG capsule Take 150 mg by mouth 2 (two) times daily. 06/25/17   [provider]    Inpatient Medications: Scheduled Meds:  amLODipine  10 mg Oral QHS   apixaban  5 mg Oral BID   atorvastatin  40 mg Oral q1800   calcium carbonate  400 mg of elemental calcium Oral TID   famotidine  20 mg Oral Daily   furosemide  40 mg Intravenous Q12H   gabapentin  300 mg Oral Daily   hydrALAZINE  25 mg Oral TID   multivitamin  1 tablet Oral Daily   ramipril  10 mg Oral Daily   sevelamer carbonate  2,400 mg Oral TID WC   traMADol  100 mg Oral Q12H   Continuous Infusions:  PRN Meds: acetaminophen **OR** acetaminophen, magnesium hydroxide, ondansetron **OR** ondansetron (ZOFRAN) IV, traZODone  Allergies:  No Known Allergies  Social History:   Social History   Socioeconomic History   Marital status: Widowed    Spouse name: Not on file   Number of children: Not on file   Years of education: Not on file   Highest education level: Not on file  Occupational History   Not on file  Social Needs   Financial resource strain: Not on file   Food insecurity    Worry: Not on file    Inability: Not on file   Transportation needs    Medical: Not on file    Non-medical: Not on file  Tobacco Use    Smoking status: Current Every Day Smoker    Packs/day: 0.25    Types: Cigarettes   Smokeless tobacco: Never Used  Substance and Sexual Activity   Alcohol use: No   Drug use: No   Sexual activity: Not on file  Lifestyle   Physical activity    Days per week: Not on file    Minutes per session: Not on file   Stress: Not on file  Relationships   Social connections    Talks on phone: Not on file    Gets together: Not on file  Attends religious service: Not on file    Active member of club or organization: Not on file    Attends meetings of clubs or organizations: Not on file    Relationship status: Not on file   Intimate partner violence    Fear of current or ex partner: Not on file    Emotionally abused: Not on file    Physically abused: Not on file    Forced sexual activity: Not on file  Other Topics Concern   Not on file  Social History Narrative   Not on file     Family History:   Family History  Problem Relation Age of Onset   Hypertension Brother     ROS:  Review of Systems  Constitutional: Positive for malaise/fatigue. Negative for chills, diaphoresis, fever and weight loss.  HENT: Negative for congestion.   Eyes: Negative for discharge and redness.  Respiratory: Negative for cough, hemoptysis, sputum production, shortness of breath and wheezing.   Cardiovascular: Negative for chest pain, palpitations, orthopnea, claudication, leg swelling and PND.  Gastrointestinal: Negative for abdominal pain, blood in stool, heartburn, melena, nausea and vomiting.  Genitourinary: Negative for hematuria.  Musculoskeletal: Positive for back pain, falls and joint pain. Negative for myalgias.       Left heel pain  Skin: Negative for rash.  Neurological: Negative for dizziness, tingling, tremors, sensory change, speech change, focal weakness, seizures, loss of consciousness, weakness and headaches.  Endo/Heme/Allergies: Does not bruise/bleed easily.    Psychiatric/Behavioral: Negative for substance abuse. The patient is not nervous/anxious.   All other systems reviewed and are negative.     Physical Exam/Data:   Vitals:   07/12/19 0030 07/12/19 0109 07/12/19 0435 07/12/19 0745  BP: (!) 176/90 (!) 165/105 128/73 (!) 143/85  Pulse: 88 (!) 107 71 72  Resp:  20 20 19   Temp:  (!) 97.4 F (36.3 C) 98.3 F (36.8 C) 98.6 F (37 C)  TempSrc:  Oral  Oral  SpO2: 100% 99% 92% 96%  Weight:  84.7 kg    Height:        Intake/Output Summary (Last 24 hours) at 07/12/2019 0936 Last data filed at 07/12/2019 0925 Gross per 24 hour  Intake 480 ml  Output 0 ml  Net 480 ml   Filed Weights   07/11/19 1716 07/12/19 0109  Weight: 56.2 kg 84.7 kg   Body mass index is 26.04 kg/m.   Physical Exam: General: Well developed, well nourished, in no acute distress. Head: Normocephalic, atraumatic, sclera non-icteric, no xanthomas, nares without discharge.  Neck: Negative for carotid bruits. JVD not elevated. Lungs: Clear bilaterally to auscultation without wheezes, rales, or rhonchi. Breathing is unlabored. Heart: RRR with S1 S2. No murmurs, rubs, or gallops appreciated. Abdomen: Soft, non-tender, non-distended with normoactive bowel sounds. No hepatomegaly. No rebound/guarding. No obvious abdominal masses. Msk:  Strength and tone appear normal for age. Extremities: No clubbing or cyanosis. No edema. Distal pedal pulses are 2+ and equal bilaterally. Neuro: Alert and oriented X 3. No facial asymmetry. No focal deficit. Moves all extremities spontaneously. Psych:  Responds to questions appropriately with a normal affect.   EKG:  The EKG was personally reviewed and demonstrates: atrial flutter with RVR with 2:1 AV block, 113 bpm, nonspecific st/t changes Telemetry:  Telemetry was personally reviewed and demonstrates: atrial flutter with variable AV block with rates currently in the 70s bpm  Weights: Filed Weights   07/11/19 1716 07/12/19 0109   Weight: 56.2 kg 84.7 kg  Relevant CV Studies: PET Myoview 2015: - Probably normal myocardial perfusion study.  - Anterolateral defect is felt to be due to artifact. There is higher than expected background signal on the stress images. - During stress: Global systolic function is mildly reduced. The ejection fraction calculated at 49%.  - Coronary calcification is noted in the left main, LAD, and LCx. There is also aortic calcification. - Incidentally noted on the attenuation CT scan is intraperitoneal fluid, a small pericardial effusion, and emphysematous changes of the lungs. - Incidentally noted on CT scan is also a 1 cm lung nodule in the right lower lobe. Per Fleischner criteria, recommend follow-up CT in 3 months. __________  Echo 2015: Left ventricular hypertrophy   Left ventricular dysfunction (estimated ejection fraction = 45%)   Mitral annular calcification   Degenerative mitral valve disease   Mitral regurgitation (mild)   Dilated left atrium   Dilated right atrium   Pericardial effusion  Laboratory Data:  Chemistry Recent Labs  Lab 07/11/19 1711 07/12/19 0223  NA 138 139  K 4.5 4.4  CL 93* 96*  CO2 27 28  GLUCOSE 75 129*  BUN 39* 46*  CREATININE 9.98* 10.92*  CALCIUM 8.0* 7.7*  GFRNONAA 5* 4*  GFRAA 5* 5*  ANIONGAP 18* 15    Recent Labs  Lab 07/11/19 1711 07/12/19 0223  PROT 7.7  --   ALBUMIN 3.7 3.5  AST 19  --   ALT 9  --   ALKPHOS 75  --   BILITOT 1.0  --    Hematology Recent Labs  Lab 07/11/19 1711 07/12/19 0223  WBC 4.3 4.3  RBC 3.89* 3.45*  HGB 11.9* 10.6*  HCT 36.4* 32.7*  MCV 93.6 94.8  MCH 30.6 30.7  MCHC 32.7 32.4  RDW 13.4 13.4  PLT 168 147*   Cardiac EnzymesNo results for input(s): TROPONINI in the last 168 hours. No results for input(s): TROPIPOC in the last 168 hours.  BNP Recent Labs  Lab 07/11/19 1711 07/12/19 0223  BNP 737.0* 730.0*    DDimer No results for input(s): DDIMER in the  last 168 hours.  Radiology/Studies:  Dg Chest 1 View  Result Date: 07/11/2019 IMPRESSION: 1. No acute cardiopulmonary process. 2. Cardiomegaly with vascular congestion. 3. Subtle bibasilar airspace opacities favored to represent atelectasis or scarring. An infiltrate is not excluded. Electronically Signed   By: Constance Holster M.D.   On: 07/11/2019 17:52   Dg Shoulder Right  Result Date: 07/11/2019 IMPRESSION: Widening of the acromioclavicular interval to 15 mm. Possible fracture of the inferior margin of the glenoid fossa. CT of the shoulder may be helpful for further characterization. Electronically Signed   By: Ulyses Jarred M.D.   On: 07/11/2019 19:54   Ct Head Wo Contrast  Result Date: 07/11/2019 IMPRESSION: 1. No acute intracranial hemorrhage. 2. Mild age-related atrophy and chronic microvascular ischemic changes. 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 3. No acute/traumatic cervical spine pathology. Extensive degenerative changes. Electronically Signed   By: Anner Crete M.D.   On: 07/11/2019 21:02   Ct Cervical Spine Wo Contrast  Result Date: 07/11/2019 IMPRESSION: 1. No acute intracranial hemorrhage. 2. Mild age-related atrophy and chronic microvascular ischemic changes. 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 3. No acute/traumatic cervical spine pathology. Extensive degenerative changes. Electronically Signed   By: Anner Crete M.D.   On: 07/11/2019 21:02   Mr Thoracic Spine Wo Contrast  Result Date: 07/11/2019 IMPRESSION: 1. 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 Infection is considered unlikely given the minimal change since 01/25/2019. 2. 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 Electronically Signed   By: Ulyses Jarred M.D.   On: 07/11/2019  23:43   Mr Lumbar Spine Wo Contrast  Result Date: 07/11/2019 IMPRESSION: 1. 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 Infection is considered unlikely given the minimal change since 01/25/2019. 2. 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 Electronically Signed   By: Ulyses Jarred M.D.   On: 07/11/2019 23:43   Ct Abdomen Pelvis W Contrast  Result Date: 07/11/2019 IMPRESSION: 1. Haziness/edema involving the retroperitoneum from the approximate level of the celiac trunk to the level of the aortic bifurcation. Retroperitoneal fibrosis is suspected, possibly in the early acute inflammatory stages. 2. Loss of the cortical margins of the endplates adjacent to the T9-10 disc, query discitis/osteomyelitis. MRI of the thoracolumbar spine may be helpful in further evaluation to confirm or deny this finding. 3. Extensive diffuse colonic diverticulosis without evidence of acute diverticulitis. 4. Cholelithiasis without CT evidence of acute cholecystitis. Small polyps versus involuting folds in the gallbladder wall. No biliary ductal dilation. 5. Small hiatal hernia. 6. Generalized osteosclerosis indicating renal osteodystrophy. 7. Moderate prostate gland enlargement. Aortic Atherosclerosis (ICD10-170.0) Electronically Signed   By: Evangeline Dakin M.D.   On: 07/11/2019 21:03    Assessment and Plan:   1. Elevated high-sensitivity troponin in the setting of rhabdomyolysis/fall: -Never with symptoms of angina -Unclear why this was checked in the ED, given presentation was falling out of bed -Minimally elevated with a delta of 1, likely in the setting of rhabdomyolysis secondary to falling out of bed and laying in the floor for 1.5 days -Not consistent with ACS -Check echo -Place on DOAC as below with discontinuation of ASA -If echo demonstrates  preserved LVSF plan for outpatient Myoview -If he is found to have a newly diagnosed cardiomyopathy, he may require inpatient ischemic evaluation   2. New diagnosed permanent atrial flutter vs atrial tachycardia: -It appears the patient has been in an atypical atrial flutter dating back to around 07/2015 -He has never seen a cardiologist for this -Not on anticoagulation upon admission -Ventricular rates are well controlled not requiring rate-limiting medications -If needed, could stop amlodipine with transition to beta blocker or non-dihydropyridine calcium channel blocker -CHADS2VASc at least 5 (HTN, age x 1, stroke x 2, vascular disease) -Patient denies recurrent falls and states the fall out of bed leading to this admission is his first fall in a year -Start Eliquis 5 mg bid, precautions discussed in detail  -Stop ASA -If he remains admitted on 7/21, recommend EP evaluation, otherwise this can be done as an outpatient -Given it appears he has been in this rhythm for nearly 4 years, we will likely need to plan for rate control strategy  -Obtain echo as above -Check TSH and magnesium  -Potassium at goal and is managed by nephrology  3. Heel pain: -This is the patient's main complaint at this time -Per IM  4. ESRD on HD: -Per nephrology -He is due for HD today  5. PAD: -Noted on imaging  -Outpatient follow up  6. Anemia of chronic disease: -Stable  7. Fall/rhabdomyolysis: -Patient denies recent falls outside of the fall out of bed leading to his presentation -PT/OT -  Per IM    For questions or updates, please contact [place] Please consult [email or web address] for contact info under Cardiology/STEMI.   Signed, Christell Faith, PA-C [place] Pager: [phone] 07/12/2019, 9:36 AM

## 2019-07-12 NOTE — Progress Notes (Signed)
[facility]  ROUNDING NOTE   Subjective:  Patient known to [place] from prior admissions. He experienced a fall at home. Apparently he had been on the floor from Friday till Sunday when his son went to check on him. Patient due for dialysis today. Currently awake and alert and conversant.   Objective:  Vital signs in last 24 hours:  Temp:  [97.4 F (36.3 C)-98.6 F (37 C)] 98.6 F (37 C) (07/20 0745) Pulse Rate:  [71-113] 72 (07/20 0745) Resp:  [16-24] 19 (07/20 0745) BP: (128-190)/(73-110) 143/85 (07/20 0745) SpO2:  [90 %-100 %] 96 % (07/20 0745) Weight:  [56.2 kg-84.7 kg] 84.7 kg (07/20 0109)  Weight change:  Filed Weights   07/11/19 1716 07/12/19 0109  Weight: 56.2 kg 84.7 kg    Intake/Output: No intake/output data recorded.   Intake/Output this shift:  Total I/O In: 480 [P.O.:480] Out: 0   Physical Exam: General: No acute distress  Head: Normocephalic, atraumatic. Moist oral mucosal membranes  Eyes: Anicteric  Neck: Supple, trachea midline  Lungs:  Clear to auscultation, normal effort  Heart: S1S2 no rubs  Abdomen:  Soft, nontender, bowel sounds present  Extremities: Trace peripheral edema.  Neurologic: Awake, alert, following commands  Skin: No lesions  Access: LUE AVF    Basic Metabolic Panel: Recent Labs  Lab 07/11/19 1711 07/12/19 0223  NA 138 139  K 4.5 4.4  CL 93* 96*  CO2 27 28  GLUCOSE 75 129*  BUN 39* 46*  CREATININE 9.98* 10.92*  CALCIUM 8.0* 7.7*  PHOS  --  7.0*    Liver Function Tests: Recent Labs  Lab 07/11/19 1711 07/12/19 0223  AST 19  --   ALT 9  --   ALKPHOS 75  --   BILITOT 1.0  --   PROT 7.7  --   ALBUMIN 3.7 3.5   Recent Labs  Lab 07/11/19 1711  LIPASE 33   No results for input(s): AMMONIA in the last 168 hours.  CBC: Recent Labs  Lab 07/11/19 1711 07/12/19 0223  WBC 4.3 4.3  NEUTROABS 2.7  --   HGB 11.9* 10.6*  HCT 36.4* 32.7*  MCV 93.6 94.8  PLT 168 147*    Cardiac Enzymes: Recent  Labs  Lab 07/11/19 1957  CKTOTAL 661*    BNP: Invalid input(s): POCBNP  CBG: No results for input(s): GLUCAP in the last 168 hours.  Microbiology: Results for orders placed or performed during the hospital encounter of 07/11/19  SARS Coronavirus 2 (CEPHEID - Performed in [facility] hospital lab), Hosp Order     Status: None   Collection Time: 07/11/19 10:00 PM   Specimen: Nasopharyngeal Swab  Result Value Ref Range Status   SARS Coronavirus 2 NEGATIVE NEGATIVE Final    Comment: (NOTE) If result is NEGATIVE SARS-CoV-2 target nucleic acids are NOT DETECTED. The SARS-CoV-2 RNA is generally detectable in upper and lower  respiratory specimens during the acute phase of infection. The lowest  concentration of SARS-CoV-2 viral copies this assay can detect is 250  copies / mL. A negative result does not preclude SARS-CoV-2 infection  and should not be used as the sole basis for treatment or other  patient management decisions.  A negative result may occur with  improper specimen collection / handling, submission of specimen other  than nasopharyngeal swab, presence of viral mutation(s) within the  areas targeted by this assay, and inadequate number of viral copies  (<250 copies / mL). A negative result must be combined  with clinical  observations, patient history, and epidemiological information. If result is POSITIVE SARS-CoV-2 target nucleic acids are DETECTED. The SARS-CoV-2 RNA is generally detectable in upper and lower  respiratory specimens dur ing the acute phase of infection.  Positive  results are indicative of active infection with SARS-CoV-2.  Clinical  correlation with patient history and other diagnostic information is  necessary to determine patient infection status.  Positive results do  not rule out bacterial infection or co-infection with other viruses. If result is PRESUMPTIVE POSTIVE SARS-CoV-2 nucleic acids MAY BE PRESENT.   A presumptive positive result was  obtained on the submitted specimen  and confirmed on repeat testing.  While 2019 novel coronavirus  (SARS-CoV-2) nucleic acids may be present in the submitted sample  additional confirmatory testing may be necessary for epidemiological  and / or clinical management purposes  to differentiate between  SARS-CoV-2 and other Sarbecovirus currently known to infect humans.  If clinically indicated additional testing with an alternate test  methodology [phone]) is advised. The SARS-CoV-2 RNA is generally  detectable in upper and lower respiratory sp ecimens during the acute  phase of infection. The expected result is Negative. Fact Sheet for Patients:  StrictlyIdeas.no Fact Sheet for Healthcare Providers: BankingDealers.co.za This test is not yet approved or cleared by the [place] FDA and has been authorized for detection and/or diagnosis of SARS-CoV-2 by FDA under an Emergency Use Authorization (EUA).  This EUA will remain in effect (meaning this test can be used) for the duration of the COVID-19 declaration under Section 564(b)(1) of the Act, 21 U.S.C. section 360bbb-3(b)(1), unless the authorization is terminated or revoked sooner. Performed at [facility], [address] [place], [place] [address]   MRSA PCR Screening     Status: None   Collection Time: 07/12/19  1:53 AM   Specimen: Nasal Mucosa; Nasopharyngeal  Result Value Ref Range Status   MRSA by PCR NEGATIVE NEGATIVE Final    Comment:        The GeneXpert MRSA Assay (FDA approved for NASAL specimens only), is one component of a comprehensive MRSA colonization surveillance program. It is not intended to diagnose MRSA infection nor to guide or monitor treatment for MRSA infections. Performed at [facility], [address] [place], [place] [address]     Coagulation Studies: No results for input(s): LABPROT, INR in the last 72  hours.  Urinalysis: No results for input(s): COLORURINE, LABSPEC, PHURINE, GLUCOSEU, HGBUR, BILIRUBINUR, KETONESUR, PROTEINUR, UROBILINOGEN, NITRITE, LEUKOCYTESUR in the last 72 hours.  Invalid input(s): APPERANCEUR    Imaging: Dg Chest 1 View  Result Date: 07/11/2019 CLINICAL DATA:  Fall EXAM: CHEST  1 VIEW COMPARISON:  January 25, 2019 FINDINGS: The heart size is enlarged. Aortic calcifications are noted. Vascular calcifications are noted. There is vascular congestion without overt pulmonary edema. There are few airspace opacities at the lung bases bilaterally favored to represent atelectasis with an infiltrate not entirely excluded. There is no pneumothorax. There is no definite acute osseous abnormality. IMPRESSION: 1. No acute cardiopulmonary process. 2. Cardiomegaly with vascular congestion. 3. Subtle bibasilar airspace opacities favored to represent atelectasis or scarring. An infiltrate is not excluded. Electronically Signed   By: Constance Holster M.D.   On: 07/11/2019 17:52   Dg Shoulder Right  Result Date: 07/11/2019 CLINICAL DATA:  Fall with shoulder pain EXAM: RIGHT SHOULDER - 2+ VIEW COMPARISON:  None. FINDINGS: There is widening of the acromioclavicular interval to 15 mm. There is irregularity at the inferior margin  of the glenoid fossa. No fracture of the humerus. IMPRESSION: Widening of the acromioclavicular interval to 15 mm. Possible fracture of the inferior margin of the glenoid fossa. CT of the shoulder may be helpful for further characterization. Electronically Signed   By: Ulyses Jarred M.D.   On: 07/11/2019 19:54   Ct Head Wo Contrast  Result Date: 07/11/2019 CLINICAL DATA:  73 year old male with fall out of bed. EXAM: CT HEAD WITHOUT CONTRAST CT CERVICAL SPINE WITHOUT CONTRAST TECHNIQUE: Multidetector CT imaging of the head and cervical spine was performed following the standard protocol without intravenous contrast. Multiplanar CT image reconstructions of the cervical  spine were also generated. COMPARISON:  Head CT dated 02/22/2017 FINDINGS: CT HEAD FINDINGS Brain: There is mild age-related atrophy and chronic microvascular ischemic changes. 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 There is no acute intracranial hemorrhage. No mass effect or midline shift. No extra-axial fluid collection. Vascular: No hyperdense vessel or unexpected calcification. Skull: Normal. Negative for fracture or focal lesion. Sinuses/Orbits: Mild mucoperiosteal thickening of paranasal sinuses. No air-fluid levels. Other: Scattered areas of scalp calcification. Small scalp hematoma over the vertex. CT CERVICAL SPINE FINDINGS Alignment: No acute subluxation. There is mild reversal of normal cervical lordosis which may be positional or due to muscle spasm or secondary to degenerative changes. Skull base and vertebrae: No acute fracture. Osteopenia. Soft tissues and spinal canal: No prevertebral fluid or swelling. No visible canal hematoma. Disc levels: Extensive multilevel degenerative changes with endplate irregularities and disc space narrowing. Degenerative changes of the odontoid and atlanto odontoid space. Multilevel facet arthropathy most prominent involving the C1-C2 lateral processes. Upper chest: Negative. Other: Atherosclerotic calcification of the aortic arch as well as bilateral carotid bulb calcified plaques. IMPRESSION: 1. No acute intracranial hemorrhage. 2. Mild age-related atrophy and chronic microvascular ischemic changes. 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 3. No acute/traumatic cervical spine pathology. Extensive degenerative changes. Electronically Signed   By: Anner Crete M.D.   On: 07/11/2019 21:02   Ct Cervical Spine Wo Contrast  Result Date: 07/11/2019 CLINICAL DATA:  73 year old male with fall out of bed. EXAM: CT HEAD WITHOUT CONTRAST CT CERVICAL SPINE WITHOUT CONTRAST TECHNIQUE: Multidetector  CT imaging of the head and cervical spine was performed following the standard protocol without intravenous contrast. Multiplanar CT image reconstructions of the cervical spine were also generated. COMPARISON:  Head CT dated 02/22/2017 FINDINGS: CT HEAD FINDINGS Brain: There is mild age-related atrophy and chronic microvascular ischemic changes. 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 There is no acute intracranial hemorrhage. No mass effect or midline shift. No extra-axial fluid collection. Vascular: No hyperdense vessel or unexpected calcification. Skull: Normal. Negative for fracture or focal lesion. Sinuses/Orbits: Mild mucoperiosteal thickening of paranasal sinuses. No air-fluid levels. Other: Scattered areas of scalp calcification. Small scalp hematoma over the vertex. CT CERVICAL SPINE FINDINGS Alignment: No acute subluxation. There is mild reversal of normal cervical lordosis which may be positional or due to muscle spasm or secondary to degenerative changes. Skull base and vertebrae: No acute fracture. Osteopenia. Soft tissues and spinal canal: No prevertebral fluid or swelling. No visible canal hematoma. Disc levels: Extensive multilevel degenerative changes with endplate irregularities and disc space narrowing. Degenerative changes of the odontoid and atlanto odontoid space. Multilevel facet arthropathy most prominent involving the C1-C2 lateral processes. Upper chest: Negative. Other: Atherosclerotic calcification of the aortic arch as well as bilateral carotid bulb calcified plaques. IMPRESSION: 1.  No acute intracranial hemorrhage. 2. Mild age-related atrophy and chronic microvascular ischemic changes. 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 3. No acute/traumatic cervical spine pathology. Extensive degenerative changes. Electronically Signed   By: Anner Crete M.D.   On: 07/11/2019 21:02   Mr Thoracic Spine Wo  Contrast  Result Date: 07/11/2019 CLINICAL DATA:  T9-10 abnormality. Recent fall with back pain. In stage renal disease. EXAM: MRI THORACIC AND LUMBAR SPINE WITHOUT CONTRAST TECHNIQUE: Multiplanar and multiecho pulse sequences of the thoracic and lumbar spine were obtained without intravenous contrast. COMPARISON:  CT chest 01/25/2019, CT abdomen pelvis 07/11/2019. FINDINGS: MRI THORACIC SPINE FINDINGS Alignment:  Physiologic. Vertebrae: No acute fracture. There is abnormal hyperintense T2-weighted signal at the endplates T9 and R48 with mild edema of the disc space. T1-weighted signal is predominantly normal. 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 Cord:  Normal signal and morphology. Paraspinal and other soft tissues: Numerous bilateral renal cysts. There is a small amount of prevertebral fluid at the T8-T11 levels. Disc levels: And T9-10, there is a small disc bulge the narrows the ventral thecal sac. Otherwise, no visible disc herniation. No spinal canal stenosis. MRI LUMBAR SPINE FINDINGS Segmentation:  Standard. Alignment:  Physiologic. Vertebrae: Heterogeneous bone marrow signal. There are type 2 Modic degenerative endplate signal changes at L4-L5. Conus medullaris and cauda equina: Conus extends to the L1 level. Conus and cauda equina appear normal. Paraspinal and other soft tissues: Diffuse cystic change of the kidneys. Disc levels: T12-L1: Normal. L1-2: Normal. L2-3: Moderate diffuse disc bulge with severe spinal canal stenosis and severe bilateral neural foraminal stenosis. L3-4: Small disc bulge with mild spinal canal stenosis. No neural foraminal stenosis. L4-5: Disc bulge and endplate spurring with severe facet hypertrophy. Severe spinal canal and bilateral neural foraminal stenosis. L5-S1: Mild facet hypertrophy.  No stenosis. IMPRESSION: 1. 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 Infection is considered unlikely given the minimal change since 01/25/2019. 2. 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 Electronically Signed   By: Ulyses Jarred M.D.   On: 07/11/2019 23:43   Mr Lumbar Spine Wo Contrast  Result Date: 07/11/2019 CLINICAL DATA:  T9-10 abnormality. Recent fall with back pain. In stage renal disease. EXAM: MRI THORACIC AND LUMBAR SPINE WITHOUT CONTRAST TECHNIQUE: Multiplanar and multiecho pulse sequences of the thoracic and lumbar spine were obtained without intravenous contrast. COMPARISON:  CT chest 01/25/2019, CT abdomen pelvis 07/11/2019. FINDINGS: MRI THORACIC SPINE FINDINGS Alignment:  Physiologic. Vertebrae: No acute fracture. There is abnormal hyperintense T2-weighted signal at the endplates T9 and N46 with mild edema of the disc space. T1-weighted signal is predominantly normal. 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 Cord:  Normal signal and morphology. Paraspinal and other soft tissues: Numerous bilateral renal cysts. There is a small amount of prevertebral fluid at the T8-T11 levels. Disc levels: And T9-10, there is a small disc bulge the narrows the ventral thecal sac. Otherwise, no visible disc herniation. No spinal canal stenosis. MRI LUMBAR SPINE FINDINGS Segmentation:  Standard. Alignment:  Physiologic. Vertebrae: Heterogeneous bone marrow signal. There are type 2 Modic degenerative endplate signal changes at L4-L5. Conus medullaris and cauda equina: Conus extends to the L1 level. Conus and cauda equina appear normal. Paraspinal and other soft tissues: Diffuse cystic change of the kidneys. Disc levels: T12-L1: Normal. L1-2: Normal. L2-3: Moderate  diffuse disc bulge with severe spinal canal stenosis and severe bilateral neural foraminal stenosis. L3-4: Small disc bulge with mild spinal canal stenosis. No neural  foraminal stenosis. L4-5: Disc bulge and endplate spurring with severe facet hypertrophy. Severe spinal canal and bilateral neural foraminal stenosis. L5-S1: Mild facet hypertrophy.  No stenosis. IMPRESSION: 1. 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 Infection is considered unlikely given the minimal change since 01/25/2019. 2. 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 Electronically Signed   By: Ulyses Jarred M.D.   On: 07/11/2019 23:43   Ct Abdomen Pelvis W Contrast  Result Date: 07/11/2019 CLINICAL DATA:  73 year old who states he fell out of bed and laid on the floor for the past 3 days. Patient complains of RIGHT UPPER QUADRANT abdominal pain, RIGHT shoulder pain, RIGHT hip pain and low back pain. Initial encounter. Current history of end-stage renal disease on hemodialysis. EXAM: CT ABDOMEN AND PELVIS WITH CONTRAST TECHNIQUE: Multidetector CT imaging of the abdomen and pelvis was performed using the standard protocol following bolus administration of intravenous contrast. CONTRAST:  142mL OMNIPAQUE IOHEXOL 300 MG/ML IV. COMPARISON:  CTA abdomen and pelvis 01/27/2017. CT abdomen and pelvis 07/20/2010. FINDINGS: Respiratory motion blurred images of the lung bases and upper abdomen. Lower chest: Expected dependent atelectasis posteriorly in the lower lobes. Visualized lung bases otherwise clear. Heart moderately enlarged. Mitral annular calcification. Hepatobiliary: Liver normal in size and appearance. Gallstones in the gallbladder, the largest measuring approximately 8 mm. Small polyps versus invaginating folds in the gallbladder wall. No pericholecystic edema or inflammation. No biliary ductal dilation. Pancreas: Mildly dilated main pancreatic duct up to approximately 6 mm diameter, unchanged since the 2018 CTA. No pancreatic masses. Spleen:  Calcified granuloma in the UPPER pole. Normal in size without significant parenchymal abnormality. Adrenals/Urinary Tract: Normal appearing adrenal glands. Polycystic kidney disease of chronic hemodialysis as noted previously. Minimally enhancing functional renal parenchyma bilaterally. No solid renal masses. Urinary bladder chronically decompressed accounting for its thickened wall. Stomach/Bowel: Small hiatal hernia. Stomach otherwise normal in appearance. Normal-appearing small bowel. Moderate stool burden throughout the colon. Extensive diffuse colonic diverticulosis without evidence of acute diverticulitis. Normal appendix in the RIGHT mid abdomen. Vascular/Lymphatic: Severe aortoiliofemoral atherosclerosis without evidence of aneurysm. Severe visceral artery atherosclerosis. No pathologic lymphadenopathy. Reproductive: Moderate prostate gland enlargement. Normal seminal vesicles. Calcification of the vasa deferens. Other: Haziness/edema involving the retroperitoneum from the approximate level of the celiac trunk to the level of the aortic bifurcation. Musculoskeletal: Loss of the cortical margins of the endplates adjacent to the T9-10 disc, new since the prior CTA. Generalized osteosclerosis indicating renal osteodystrophy. Severe degenerative disc disease at L4-5. Severe diffuse facet degenerative changes involving the lumbar spine. Severe degenerative changes involving the hip joints. IMPRESSION: 1. Haziness/edema involving the retroperitoneum from the approximate level of the celiac trunk to the level of the aortic bifurcation. Retroperitoneal fibrosis is suspected, possibly in the early acute inflammatory stages. 2. Loss of the cortical margins of the endplates adjacent to the T9-10 disc, query discitis/osteomyelitis. MRI of the thoracolumbar spine may be helpful in further evaluation to confirm or deny this finding. 3. Extensive diffuse colonic diverticulosis without evidence of acute diverticulitis. 4.  Cholelithiasis without CT evidence of acute cholecystitis. Small polyps versus involuting folds in the gallbladder wall. No biliary ductal dilation. 5. Small hiatal hernia. 6. Generalized osteosclerosis indicating renal osteodystrophy. 7. Moderate prostate gland enlargement. Aortic Atherosclerosis (ICD10-170.0)  Electronically Signed   By: Evangeline Dakin M.D.   On: 07/11/2019 21:03     Medications:    . amLODipine  10 mg Oral QHS  . apixaban  5 mg Oral BID  . atorvastatin  40 mg Oral q1800  . calcium carbonate  400 mg of elemental calcium Oral TID  . Chlorhexidine Gluconate Cloth  6 each Topical Q0600  . famotidine  20 mg Oral Daily  . furosemide  40 mg Intravenous Q12H  . gabapentin  300 mg Oral Daily  . hydrALAZINE  25 mg Oral TID  . multivitamin  1 tablet Oral Daily  . ramipril  10 mg Oral Daily  . sevelamer carbonate  2,400 mg Oral TID WC  . traMADol  100 mg Oral Q12H   acetaminophen **OR** acetaminophen, magnesium hydroxide, ondansetron **OR** ondansetron (ZOFRAN) IV, traZODone  Assessment/ Plan:  73 y.o. male  with end-stage renal disease, hypertension, hyperlipidemia, degenerative disc disease, anemia chronic kidney disease, secondary hyperparathyroidism, admitted now for pneumonia.  [facility]/[address]/MWF  1.  ESRD on HD MWF.    Patient due for hemodialysis today.  Orders have been prepared.  Limited ultrafiltration of 1.5 kg as he was laying on the floor mainly from Friday to Sunday.  2.  Anemia of chronic kidney disease.  Hemoglobin currently 10.6.  Hold off on Epogen for now.  3.  Secondary hyperparathyroidism.  Maintain the patient on Renvela 2400 mg p.o. 3 times daily with meals.  Check phosphorus and intact PTH today.  4.  Hypertension.  Maintain the patient on ramipril and hydralazine.  5.  Status post fall.  Agree with physical therapy consultation.   LOS: 1 Verla Bryngelson 7/20/202011:35 AM

## 2019-07-12 NOTE — Progress Notes (Signed)
Pt. back from dialysis

## 2019-07-12 NOTE — Progress Notes (Signed)
Post HD Tx Note:     07/12/19 1645  Vital Signs  Temp 98.4 F (36.9 C)  Temp Source Oral  Pulse Rate 79  Pulse Rate Source Monitor  Resp 16  BP 123/72  BP Location Right Arm  BP Method Automatic  Patient Position (if appropriate) Lying  Oxygen Therapy  SpO2 97 %  O2 Device Room Air  Patient Activity (if Appropriate) In bed  Pulse Oximetry Type Continuous  Pain Assessment  Pain Scale 0-10  Pain Score 0  Dialysis Weight  Weight  (unable to weigh Pt on bed)  Type of Weight Post-Dialysis (unable to weigh Pt on bed)  Post-Hemodialysis Assessment  Rinseback Volume (mL) 250 mL  KECN 78.3 V  Dialyzer Clearance Lightly streaked  Duration of HD Treatment -hour(s) 3.5 hour(s)  Hemodialysis Intake (mL) 500 mL  UF Total -Machine (mL) 2019 mL  Net UF (mL) 1519 mL  Tolerated HD Treatment Yes  AVG/AVF Arterial Site Held (minutes)  (5 min)  AVG/AVF Venous Site Held (minutes)  (5 min)  Education / Care Plan  Dialysis Education Provided Yes  Documented Education in Care Plan Yes  Outpatient Plan of Care Reviewed and on Chart Yes

## 2019-07-12 NOTE — Plan of Care (Signed)

## 2019-07-12 NOTE — Progress Notes (Signed)
OT Cancellation Note  Patient Details Name: Brian Fleming MRN: [id number] DOB: [birth year]   Cancelled Evaluation:    Reason Eval/Treat Not Completed: Patient at procedure or test/ unavailable. Thank you for the OT consult. Order received and chart reviewed. Per pt RN pt is currently off the unit for dialysis. Will follow acutely and re-attempt at a later time/date as available and pt medically appropriate for OT Evaluation.   Shara Blazing, M.S., OTR/L Ascom: [phone] 07/12/19, 1:19 PM   [name] 07/12/2019, 1:18 PM

## 2019-07-13 DIAGNOSIS — I4892 Unspecified atrial flutter: Secondary | ICD-10-CM

## 2019-07-13 LAB — BRAIN NATRIURETIC PEPTIDE: B Natriuretic Peptide: 508 pg/mL — ABNORMAL HIGH (ref 0.0–100.0)

## 2019-07-13 LAB — ECHOCARDIOGRAM COMPLETE
Height: 71 in
Weight: 2928 oz

## 2019-07-13 LAB — MAGNESIUM: Magnesium: 2 mg/dL (ref 1.7–2.4)

## 2019-07-13 LAB — TSH: TSH: 1.662 u[IU]/mL (ref 0.350–4.500)

## 2019-07-13 NOTE — Progress Notes (Signed)
[place] [facility] [place] NAME: Brian Fleming    MR#:  [id number]  DATE OF BIRTH:  July 16, 1946  SUBJECTIVE:  CHIEF COMPLAINT:   Chief Complaint  Patient presents with  . Fall   Came after the fall and unable to get up off the floor for 1 to 2 days. No new complaints. REVIEW OF SYSTEMS:  CONSTITUTIONAL: No fever,have fatigue or weakness.  EYES: No blurred or double vision.  EARS, NOSE, AND THROAT: No tinnitus or ear pain.  RESPIRATORY: No cough, shortness of breath, wheezing or hemoptysis.  CARDIOVASCULAR: No chest pain, orthopnea, edema.  GASTROINTESTINAL: No nausea, vomiting, diarrhea or abdominal pain.  GENITOURINARY: No dysuria, hematuria.  ENDOCRINE: No polyuria, nocturia,  HEMATOLOGY: No anemia, easy bruising or bleeding SKIN: No rash or lesion. MUSCULOSKELETAL: No joint pain or arthritis.   NEUROLOGIC: No tingling, numbness, weakness.  PSYCHIATRY: No anxiety or depression.   ROS  DRUG ALLERGIES:  No Known Allergies  VITALS:  Blood pressure 116/64, pulse (!) 56, temperature 97.9 F (36.6 C), resp. rate 18, height 5\' 11"  (1.803 m), weight 83 kg, SpO2 93 %.  PHYSICAL EXAMINATION:  GENERAL:  73 y.o.-year-old patient lying in the bed with no acute distress.  EYES: Pupils equal, round, reactive to light and accommodation. No scleral icterus. Extraocular muscles intact.  HEENT: Head atraumatic, normocephalic. Oropharynx and nasopharynx clear.  NECK:  Supple, no jugular venous distention. No thyroid enlargement, no tenderness.  LUNGS: Normal breath sounds bilaterally, no wheezing, rales,rhonchi or crepitation. No use of accessory muscles of respiration.  CARDIOVASCULAR: S1, S2 normal. No murmurs, rubs, or gallops.  ABDOMEN: Soft, nontender, nondistended. Bowel sounds present. No organomegaly or mass.  EXTREMITIES: No pedal edema, cyanosis, or clubbing.  NEUROLOGIC: Cranial nerves II through XII are intact. Muscle strength 3-4/5 in  all extremities. Sensation intact. Gait not checked.  PSYCHIATRIC: The patient is alert and oriented x 2-3.  SKIN: No obvious rash, lesion, or ulcer.   Physical Exam LABORATORY PANEL:   CBC Recent Labs  Lab 07/12/19 0223  WBC 4.3  HGB 10.6*  HCT 32.7*  PLT 147*   ------------------------------------------------------------------------------------------------------------------  Chemistries  Recent Labs  Lab 07/11/19 1711 07/12/19 0223 07/13/19 0419  NA 138 139  --   K 4.5 4.4  --   CL 93* 96*  --   CO2 27 28  --   GLUCOSE 75 129*  --   BUN 39* 46*  --   CREATININE 9.98* 10.92*  --   CALCIUM 8.0* 7.7*  --   MG  --   --  2.0  AST 19  --   --   ALT 9  --   --   ALKPHOS 75  --   --   BILITOT 1.0  --   --    ------------------------------------------------------------------------------------------------------------------  Cardiac Enzymes No results for input(s): TROPONINI in the last 168 hours. ------------------------------------------------------------------------------------------------------------------  RADIOLOGY:  Dg Chest 1 View  Result Date: 07/11/2019 CLINICAL DATA:  Fall EXAM: CHEST  1 VIEW COMPARISON:  January 25, 2019 FINDINGS: The heart size is enlarged. Aortic calcifications are noted. Vascular calcifications are noted. There is vascular congestion without overt pulmonary edema. There are few airspace opacities at the lung bases bilaterally favored to represent atelectasis with an infiltrate not entirely excluded. There is no pneumothorax. There is no definite acute osseous abnormality. IMPRESSION: 1. No acute cardiopulmonary process. 2. Cardiomegaly with vascular congestion. 3. Subtle bibasilar airspace opacities favored to represent atelectasis or scarring.  An infiltrate is not excluded. Electronically Signed   By: Constance Holster M.D.   On: 07/11/2019 17:52   Dg Shoulder Right  Result Date: 07/11/2019 CLINICAL DATA:  Fall with shoulder pain EXAM: RIGHT  SHOULDER - 2+ VIEW COMPARISON:  None. FINDINGS: There is widening of the acromioclavicular interval to 15 mm. There is irregularity at the inferior margin of the glenoid fossa. No fracture of the humerus. IMPRESSION: Widening of the acromioclavicular interval to 15 mm. Possible fracture of the inferior margin of the glenoid fossa. CT of the shoulder may be helpful for further characterization. Electronically Signed   By: Ulyses Jarred M.D.   On: 07/11/2019 19:54   Ct Head Wo Contrast  Result Date: 07/11/2019 CLINICAL DATA:  73 year old male with fall out of bed. EXAM: CT HEAD WITHOUT CONTRAST CT CERVICAL SPINE WITHOUT CONTRAST TECHNIQUE: Multidetector CT imaging of the head and cervical spine was performed following the standard protocol without intravenous contrast. Multiplanar CT image reconstructions of the cervical spine were also generated. COMPARISON:  Head CT dated 02/22/2017 FINDINGS: CT HEAD FINDINGS Brain: There is mild age-related atrophy and chronic microvascular ischemic changes. 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 There is no acute intracranial hemorrhage. No mass effect or midline shift. No extra-axial fluid collection. Vascular: No hyperdense vessel or unexpected calcification. Skull: Normal. Negative for fracture or focal lesion. Sinuses/Orbits: Mild mucoperiosteal thickening of paranasal sinuses. No air-fluid levels. Other: Scattered areas of scalp calcification. Small scalp hematoma over the vertex. CT CERVICAL SPINE FINDINGS Alignment: No acute subluxation. There is mild reversal of normal cervical lordosis which may be positional or due to muscle spasm or secondary to degenerative changes. Skull base and vertebrae: No acute fracture. Osteopenia. Soft tissues and spinal canal: No prevertebral fluid or swelling. No visible canal hematoma. Disc levels: Extensive multilevel degenerative changes with endplate irregularities and disc space  narrowing. Degenerative changes of the odontoid and atlanto odontoid space. Multilevel facet arthropathy most prominent involving the C1-C2 lateral processes. Upper chest: Negative. Other: Atherosclerotic calcification of the aortic arch as well as bilateral carotid bulb calcified plaques. IMPRESSION: 1. No acute intracranial hemorrhage. 2. Mild age-related atrophy and chronic microvascular ischemic changes. 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 3. No acute/traumatic cervical spine pathology. Extensive degenerative changes. Electronically Signed   By: Anner Crete M.D.   On: 07/11/2019 21:02   Ct Cervical Spine Wo Contrast  Result Date: 07/11/2019 CLINICAL DATA:  73 year old male with fall out of bed. EXAM: CT HEAD WITHOUT CONTRAST CT CERVICAL SPINE WITHOUT CONTRAST TECHNIQUE: Multidetector CT imaging of the head and cervical spine was performed following the standard protocol without intravenous contrast. Multiplanar CT image reconstructions of the cervical spine were also generated. COMPARISON:  Head CT dated 02/22/2017 FINDINGS: CT HEAD FINDINGS Brain: There is mild age-related atrophy and chronic microvascular ischemic changes. 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 There is no acute intracranial hemorrhage. No mass effect or midline shift. No extra-axial fluid collection. Vascular: No hyperdense vessel or unexpected calcification. Skull: Normal. Negative for fracture or focal lesion. Sinuses/Orbits: Mild mucoperiosteal thickening of paranasal sinuses. No air-fluid levels. Other: Scattered areas of scalp calcification. Small scalp hematoma over the vertex. CT CERVICAL SPINE FINDINGS Alignment: No acute subluxation. There is mild reversal of normal cervical lordosis which may be positional or due to muscle spasm or secondary to degenerative changes. Skull base and vertebrae: No acute fracture. Osteopenia. Soft tissues and  spinal canal: No prevertebral fluid or swelling. No visible canal hematoma. Disc levels: Extensive multilevel degenerative changes with endplate irregularities and disc space narrowing. Degenerative changes of the odontoid and atlanto odontoid space. Multilevel facet arthropathy most prominent involving the C1-C2 lateral processes. Upper chest: Negative. Other: Atherosclerotic calcification of the aortic arch as well as bilateral carotid bulb calcified plaques. IMPRESSION: 1. No acute intracranial hemorrhage. 2. Mild age-related atrophy and chronic microvascular ischemic changes. 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 3. No acute/traumatic cervical spine pathology. Extensive degenerative changes. Electronically Signed   By: Anner Crete M.D.   On: 07/11/2019 21:02   Mr Thoracic Spine Wo Contrast  Result Date: 07/11/2019 CLINICAL DATA:  T9-10 abnormality. Recent fall with back pain. In stage renal disease. EXAM: MRI THORACIC AND LUMBAR SPINE WITHOUT CONTRAST TECHNIQUE: Multiplanar and multiecho pulse sequences of the thoracic and lumbar spine were obtained without intravenous contrast. COMPARISON:  CT chest 01/25/2019, CT abdomen pelvis 07/11/2019. FINDINGS: MRI THORACIC SPINE FINDINGS Alignment:  Physiologic. Vertebrae: No acute fracture. There is abnormal hyperintense T2-weighted signal at the endplates T9 and K81 with mild edema of the disc space. T1-weighted signal is predominantly normal. 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 Cord:  Normal signal and morphology. Paraspinal and other soft tissues: Numerous bilateral renal cysts. There is a small amount of prevertebral fluid at the T8-T11 levels. Disc levels: And T9-10, there is a small disc bulge the narrows the ventral thecal sac. Otherwise, no visible disc herniation. No spinal canal stenosis. MRI LUMBAR SPINE FINDINGS Segmentation:  Standard. Alignment:  Physiologic.  Vertebrae: Heterogeneous bone marrow signal. There are type 2 Modic degenerative endplate signal changes at L4-L5. Conus medullaris and cauda equina: Conus extends to the L1 level. Conus and cauda equina appear normal. Paraspinal and other soft tissues: Diffuse cystic change of the kidneys. Disc levels: T12-L1: Normal. L1-2: Normal. L2-3: Moderate diffuse disc bulge with severe spinal canal stenosis and severe bilateral neural foraminal stenosis. L3-4: Small disc bulge with mild spinal canal stenosis. No neural foraminal stenosis. L4-5: Disc bulge and endplate spurring with severe facet hypertrophy. Severe spinal canal and bilateral neural foraminal stenosis. L5-S1: Mild facet hypertrophy.  No stenosis. IMPRESSION: 1. 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 Infection is considered unlikely given the minimal change since 01/25/2019. 2. 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 Electronically Signed   By: Ulyses Jarred M.D.   On: 07/11/2019 23:43   Mr Lumbar Spine Wo Contrast  Result Date: 07/11/2019 CLINICAL DATA:  T9-10 abnormality. Recent fall with back pain. In stage renal disease. EXAM: MRI THORACIC AND LUMBAR SPINE WITHOUT CONTRAST TECHNIQUE: Multiplanar and multiecho pulse sequences of the thoracic and lumbar spine were obtained without intravenous contrast. COMPARISON:  CT chest 01/25/2019, CT abdomen pelvis 07/11/2019. FINDINGS: MRI THORACIC SPINE FINDINGS Alignment:  Physiologic. Vertebrae: No acute fracture. There is abnormal hyperintense T2-weighted signal at the endplates T9 and E75 with mild edema of the disc space. T1-weighted signal is predominantly normal. 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 Cord:  Normal signal and morphology. Paraspinal and other soft tissues: Numerous bilateral  renal cysts. There is a small amount of prevertebral fluid at the T8-T11 levels. Disc levels: And T9-10, there is a small disc bulge the narrows the ventral thecal sac. Otherwise, no visible disc herniation. No spinal canal  stenosis. MRI LUMBAR SPINE FINDINGS Segmentation:  Standard. Alignment:  Physiologic. Vertebrae: Heterogeneous bone marrow signal. There are type 2 Modic degenerative endplate signal changes at L4-L5. Conus medullaris and cauda equina: Conus extends to the L1 level. Conus and cauda equina appear normal. Paraspinal and other soft tissues: Diffuse cystic change of the kidneys. Disc levels: T12-L1: Normal. L1-2: Normal. L2-3: Moderate diffuse disc bulge with severe spinal canal stenosis and severe bilateral neural foraminal stenosis. L3-4: Small disc bulge with mild spinal canal stenosis. No neural foraminal stenosis. L4-5: Disc bulge and endplate spurring with severe facet hypertrophy. Severe spinal canal and bilateral neural foraminal stenosis. L5-S1: Mild facet hypertrophy.  No stenosis. IMPRESSION: 1. 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 Infection is considered unlikely given the minimal change since 01/25/2019. 2. 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 Electronically Signed   By: Ulyses Jarred M.D.   On: 07/11/2019 23:43   Ct Abdomen Pelvis W Contrast  Result Date: 07/11/2019 CLINICAL DATA:  73 year old who states he fell out of bed and laid on the floor for the past 3 days. Patient complains of RIGHT UPPER QUADRANT abdominal pain, RIGHT shoulder pain, RIGHT hip pain and low back pain. Initial encounter. Current history of end-stage renal disease on hemodialysis. EXAM: CT ABDOMEN AND PELVIS WITH CONTRAST TECHNIQUE: Multidetector CT imaging of the abdomen and pelvis was performed using the standard protocol following  bolus administration of intravenous contrast. CONTRAST:  130mL OMNIPAQUE IOHEXOL 300 MG/ML IV. COMPARISON:  CTA abdomen and pelvis 01/27/2017. CT abdomen and pelvis 07/20/2010. FINDINGS: Respiratory motion blurred images of the lung bases and upper abdomen. Lower chest: Expected dependent atelectasis posteriorly in the lower lobes. Visualized lung bases otherwise clear. Heart moderately enlarged. Mitral annular calcification. Hepatobiliary: Liver normal in size and appearance. Gallstones in the gallbladder, the largest measuring approximately 8 mm. Small polyps versus invaginating folds in the gallbladder wall. No pericholecystic edema or inflammation. No biliary ductal dilation. Pancreas: Mildly dilated main pancreatic duct up to approximately 6 mm diameter, unchanged since the 2018 CTA. No pancreatic masses. Spleen: Calcified granuloma in the UPPER pole. Normal in size without significant parenchymal abnormality. Adrenals/Urinary Tract: Normal appearing adrenal glands. Polycystic kidney disease of chronic hemodialysis as noted previously. Minimally enhancing functional renal parenchyma bilaterally. No solid renal masses. Urinary bladder chronically decompressed accounting for its thickened wall. Stomach/Bowel: Small hiatal hernia. Stomach otherwise normal in appearance. Normal-appearing small bowel. Moderate stool burden throughout the colon. Extensive diffuse colonic diverticulosis without evidence of acute diverticulitis. Normal appendix in the RIGHT mid abdomen. Vascular/Lymphatic: Severe aortoiliofemoral atherosclerosis without evidence of aneurysm. Severe visceral artery atherosclerosis. No pathologic lymphadenopathy. Reproductive: Moderate prostate gland enlargement. Normal seminal vesicles. Calcification of the vasa deferens. Other: Haziness/edema involving the retroperitoneum from the approximate level of the celiac trunk to the level of the aortic bifurcation. Musculoskeletal: Loss of the cortical margins  of the endplates adjacent to the T9-10 disc, new since the prior CTA. Generalized osteosclerosis indicating renal osteodystrophy. Severe degenerative disc disease at L4-5. Severe diffuse facet degenerative changes involving the lumbar spine. Severe degenerative changes involving the hip joints. IMPRESSION: 1. Haziness/edema involving the retroperitoneum from the approximate level of the celiac trunk to the level of the aortic bifurcation. Retroperitoneal fibrosis is suspected, possibly in the early acute inflammatory stages. 2. Loss of the cortical margins of the endplates adjacent to the T9-10 disc, query discitis/osteomyelitis. MRI of the thoracolumbar  spine may be helpful in further evaluation to confirm or deny this finding. 3. Extensive diffuse colonic diverticulosis without evidence of acute diverticulitis. 4. Cholelithiasis without CT evidence of acute cholecystitis. Small polyps versus involuting folds in the gallbladder wall. No biliary ductal dilation. 5. Small hiatal hernia. 6. Generalized osteosclerosis indicating renal osteodystrophy. 7. Moderate prostate gland enlargement. Aortic Atherosclerosis (ICD10-170.0) Electronically Signed   By: Evangeline Dakin M.D.   On: 07/11/2019 21:03    ASSESSMENT AND PLAN:   Active Problems:   Fall  1.  Fall.  This could have been a mechanical fall or accidental mild rolling during his sleep.  He has no subsequent injuries.   - physical therapy consultation to assist his ambulation since he has been on the floor since Friday.  suggested SNF placement initially but changed to home health with supervision today. - His head CT scan without contrast came back negative.  - follow neuro checks every 4 hours for 24 hours- stable.  2.  Elevated troponin I.   - follow serial troponin levels and obtain a 2D echo and a cardiology consult.   - Demand ischemia- no further work ups.  3.  End-stage renal disease on hemodialysis. -Nephrology consultation  -This  could be contributing to fluid overload that gave rise to his elevated BNP. -He could be having mild acute on chronic CHF, possibly diastolic.    Given IV Lasix but as patient is at hemodialysis we will stop Lasix now. - he does not have significant clinical CHF symptoms.  4.  Back pain.  I believe that this is chronic.  The finding on the CT was suspicious for possible T9-T 10 discitis however MRI ruled it out as mentioned above.  Pain management will be provided.  5.  Hypertension.  We will continue Altace and amlodipine.  6.  Dyslipidemia.  Continue statin therapy.  7.  DVT prophylaxis.  Subtenons heparin.  8. A fib- per cardio started on eliquis, Rate is controlled. Check magnesium and TSH and they are normal. Cardiologist has discussed his EKG and findings with EP physician and no new changes suggested   All the records are reviewed and case discussed with Care Management/Social Workerr. Management plans discussed with the patient, family and they are in agreement.  CODE STATUS: Full.  TOTAL TIME TAKING CARE OF THIS PATIENT: 35 minutes.   I called patient's son but he did not pick up the phone.  POSSIBLE D/C IN 1-2 DAYS, DEPENDING ON CLINICAL CONDITION.   Vaughan Basta M.D on 07/13/2019   Between 7am to 6pm - Pager - [phone]  After 6pm go to [email or web address] - password [email or web address] [facility]  Office  [phone]  CC: Primary care physician; System, Pcp Not In  Note: This dictation was prepared with Dragon dictation along with smaller phrase technology. Any transcriptional errors that result from this process are unintentional.

## 2019-07-13 NOTE — TOC Progression Note (Addendum)
Transition of Care [facility]) - Progression Note    Patient Details  Name: Brian Fleming MRN: [id number] Date of Birth: 1946/04/05  [facility] CM/SW Contact  Katrina Stack, RN Phone Number: 07/13/2019, 4:43 PM  Clinical Narrative:   Physical therapy is now recommending home with home health.  Notified [facility] who was given heads up 7/20 of need for PT and RN.  Added OT to the referral. Patient has an personal care service aide 7 days a week.  Spoke with son Houston Siren who says he  became patient's legal guardian about one month ago and has the documentation in his possession.  CM discussed the need to bring a copy to hospital so can be scanned into patient's record. Has a cane and walker at home but only uses the cane.  Son will "put the cane up' so patient will use his walker.    Expected Discharge Plan: [place] Barriers to Discharge: Continued Medical Work up  Expected Discharge Plan and Services Expected Discharge Plan: [place]   Discharge Planning Services: CM Consult Post Acute Care Choice: [place] arrangements for the past 2 months: [place]: [place] ([place]) Date [place]: 07/12/19 Time [place]: 1024 Representative spoke with at [place]: [place] ([place]) Interventions    Readmission Risk Interventions Readmission Risk Prevention Plan 07/12/2019  Transportation Screening Complete  PCP or Specialist Appt within 5-7 Days Complete  Home Care Screening Complete  Medication Review (RN CM) Complete  Some recent data might be hidden

## 2019-07-13 NOTE — Progress Notes (Signed)
Physical Therapy Treatment Patient Details Name: Brian Fleming MRN: [id number] DOB: 05-Jul-1946 Today's Date: 07/13/2019    History of Present Illness 73 y.o male who arrived at hospital ED on 07/11/2019 via EMS after falling out of bed 07/09/2019 and laying on the floor. HD port on left forearm. He was admited to the hospital for fall, elevated troponin, end-stage renal disease on hemodialysis, elevated BNP, mild acute on chronic CHF, and back pain (likely chronic). Cardiology consult sugessts troponin elevation is in setting of rhabdomyolysis/fall. PMH also includes hx of back surgery, atrial flutter, HTN. 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 ABnromal T9-10 endplate unklikely to be infection. 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 Negative for acute/traumatic c-spine pathology. X-ray of R shoulder shows AC joint separation and possible fx of inferior margin of the glenoid fossa. Chest radiograph show cardiomegaly with vascular congestin and possible atelectasis or scarring.    PT Comments    Pt was sleepy on arrival but eager to show that he is safe to go home and willing to participate with some ambulation.  He showed good effort with circumambulation of the nurses' station and actually seemed to do better (posture, confidence) with single UE use than walker.  He reports he normally just uses a walking stick.  Pt weaker than his baseline and would benefit from HHPT, but it does not appear that he needs STR.  He does have assistant QD who does errands, cleans, etc.  Follow Up Recommendations  Home health PT;Supervision - Intermittent     Equipment Recommendations  None recommended by PT    Recommendations for Other Services  OT consult     Precautions / Restrictions Precautions Precautions: Fall Restrictions Weight Bearing Restrictions: No    Mobility  Bed Mobility Overal bed mobility: Modified Independent             General bed mobility comments: No assist needed to get to EOB, light use of rail  Transfers Overall transfer level: Needs assistance Equipment used: Rolling walker (2 wheeled) Transfers: Sit to/from Stand Sit to Stand: Min guard         General transfer comment: Very forward flexed lean to get to standing, heavy use of UEs to rise to partial standing (remained forward flexed over walker.  Ambulation/Gait Ambulation/Gait assistance: Supervision Gait Distance (Feet): 200 Feet Assistive device: Rolling walker (2 wheeled)       General Gait Details: Pt again able to circumambulate the nurses' station without direct physical assist.  Still showing poor awareness staying inside walker on turns and to keep from leaning too far forward.  Actually did better with small stretch with just L UE on hallway rail, more upright and more confident stride.  Pt reports that he feels only minimal slower than his baseline this date.  No significant fatigue   Stairs             Wheelchair Mobility    Modified Rankin (Stroke Patients Only)       Balance Overall balance assessment: Needs assistance Sitting-balance support: Feet supported;Bilateral upper extremity supported Sitting balance-Leahy Scale: Good Sitting balance - Comments: patient steady sitting at edge of bed.     Standing balance-Leahy Scale: Fair Standing balance comment: very poor posture, but able to maintain balance with and w/o AD  Cognition Arousal/Alertness: Awake/alert Behavior During Therapy: WFL for tasks assessed/performed Overall Cognitive Status: Within Functional Limits for tasks assessed                                        Exercises       General Comments        Pertinent Vitals/Pain Pain Assessment: (continues to c/o chronic LBP and general RA pain)    Home Living                      Prior Function            PT Goals (current goals can now be found in the care plan section) Progress towards PT goals: Progressing toward goals    Frequency    Min 2X/week      PT Plan Current plan remains appropriate    Co-evaluation              AM-PAC PT "6 Clicks" Mobility   Outcome Measure  Help needed turning from your back to your side while in a flat bed without using bedrails?: A Little Help needed moving from lying on your back to sitting on the side of a flat bed without using bedrails?: A Little Help needed moving to and from a bed to a chair (including a wheelchair)?: A Little Help needed standing up from a chair using your arms (e.g., wheelchair or bedside chair)?: A Little Help needed to walk in hospital room?: A Little Help needed climbing 3-5 steps with a railing? : A Little 6 Click Score: 18    End of Session Equipment Utilized During Treatment: Gait belt Activity Tolerance: Patient tolerated treatment well;Patient limited by fatigue Patient left: in chair;with call bell/phone within reach;with chair alarm set;with nursing/sitter in room Nurse Communication: Mobility status PT Visit Diagnosis: Unsteadiness on feet (R26.81);Muscle weakness (generalized) (M62.81);History of falling (Z91.81);Difficulty in walking, not elsewhere classified (R26.2);Other abnormalities of gait and mobility (R26.89);Pain Pain - part of body: (lumbago)     Time: 1457-1510 PT Time Calculation (min) (ACUTE ONLY): 13 min  Charges:  $Gait Training: 8-22 mins                     Kreg Shropshire, DPT 07/13/2019, 4:04 PM

## 2019-07-13 NOTE — Progress Notes (Signed)
Established hemodialysis patient known at [facility] MWF 5:45am. Please note that any change in COVID or mobility may change this plan. Please contact me directly for any dialysis placement concerns.  Elvera Bicker Dialysis Coordinator [phone]

## 2019-07-13 NOTE — Progress Notes (Signed)
OT Cancellation Note  Patient Details Name: Brian Fleming MRN: [id number] DOB: 1946-03-19   Cancelled Treatment:    Reason Eval/Treat Not Completed: Patient declined, no reason specified. Consult received, chart reviewed. Pt sleeping soundly, requires moderate verbal cues to wake. Upon waking, pt states, "Can I just get some sleep?" Pt requests OT come back in the morning tomorrow. Will do my best to meet pt's wishes.   Jeni Salles, MPH, MS, OTR/L ascom [phone] 07/13/19, 2:58 PM

## 2019-07-13 NOTE — Progress Notes (Signed)
[facility]  ROUNDING NOTE   Subjective:  Patient completed dialysis yesterday. Also underwent physical therapy. States that he was able to ambulate.   Objective:  Vital signs in last 24 hours:  Temp:  [98.4 F (36.9 C)-99.3 F (37.4 C)] 98.6 F (37 C) (07/21 0808) Pulse Rate:  [47-80] 61 (07/21 0900) Resp:  [14-16] 16 (07/21 0808) BP: (116-137)/(66-79) 131/74 (07/21 0808) SpO2:  [92 %-100 %] 99 % (07/21 0808) Weight:  [83 kg] 83 kg (07/20 1723)  Weight change: 26.8 kg Filed Weights   07/12/19 0109 07/12/19 1723  Weight: 84.7 kg 83 kg    Intake/Output: I/O last 3 completed shifts: In: 480 [P.O.:480] Out: 1519 [Other:1519]   Intake/Output this shift:  Total I/O In: 240 [P.O.:240] Out: 0   Physical Exam: General: No acute distress  Head: Normocephalic, atraumatic. Moist oral mucosal membranes  Eyes: Anicteric  Neck: Supple, trachea midline  Lungs:  Clear to auscultation, normal effort  Heart: S1S2 no rubs  Abdomen:  Soft, nontender, bowel sounds present  Extremities: Trace peripheral edema.  Neurologic: Awake, alert, following commands  Skin: No lesions  Access: LUE AVF    Basic Metabolic Panel: Recent Labs  Lab 07/11/19 1711 07/12/19 0223  NA 138 139  K 4.5 4.4  CL 93* 96*  CO2 27 28  GLUCOSE 75 129*  BUN 39* 46*  CREATININE 9.98* 10.92*  CALCIUM 8.0* 7.7*  PHOS  --  7.0*    Liver Function Tests: Recent Labs  Lab 07/11/19 1711 07/12/19 0223  AST 19  --   ALT 9  --   ALKPHOS 75  --   BILITOT 1.0  --   PROT 7.7  --   ALBUMIN 3.7 3.5   Recent Labs  Lab 07/11/19 1711  LIPASE 33   No results for input(s): AMMONIA in the last 168 hours.  CBC: Recent Labs  Lab 07/11/19 1711 07/12/19 0223  WBC 4.3 4.3  NEUTROABS 2.7  --   HGB 11.9* 10.6*  HCT 36.4* 32.7*  MCV 93.6 94.8  PLT 168 147*    Cardiac Enzymes: Recent Labs  Lab 07/11/19 1957  CKTOTAL 661*    BNP: Invalid input(s): POCBNP  CBG: No results for  input(s): GLUCAP in the last 168 hours.  Microbiology: Results for orders placed or performed during the hospital encounter of 07/11/19  SARS Coronavirus 2 (CEPHEID - Performed in [place] hospital lab), Hosp Order     Status: None   Collection Time: 07/11/19 10:00 PM   Specimen: Nasopharyngeal Swab  Result Value Ref Range Status   SARS Coronavirus 2 NEGATIVE NEGATIVE Final    Comment: (NOTE) If result is NEGATIVE SARS-CoV-2 target nucleic acids are NOT DETECTED. The SARS-CoV-2 RNA is generally detectable in upper and lower  respiratory specimens during the acute phase of infection. The lowest  concentration of SARS-CoV-2 viral copies this assay can detect is 250  copies / mL. A negative result does not preclude SARS-CoV-2 infection  and should not be used as the sole basis for treatment or other  patient management decisions.  A negative result may occur with  improper specimen collection / handling, submission of specimen other  than nasopharyngeal swab, presence of viral mutation(s) within the  areas targeted by this assay, and inadequate number of viral copies  (<250 copies / mL). A negative result must be combined with clinical  observations, patient history, and epidemiological information. If result is POSITIVE SARS-CoV-2 target nucleic acids are DETECTED. The SARS-CoV-2  RNA is generally detectable in upper and lower  respiratory specimens dur ing the acute phase of infection.  Positive  results are indicative of active infection with SARS-CoV-2.  Clinical  correlation with patient history and other diagnostic information is  necessary to determine patient infection status.  Positive results do  not rule out bacterial infection or co-infection with other viruses. If result is PRESUMPTIVE POSTIVE SARS-CoV-2 nucleic acids MAY BE PRESENT.   A presumptive positive result was obtained on the submitted specimen  and confirmed on repeat testing.  While 2019 novel coronavirus   (SARS-CoV-2) nucleic acids may be present in the submitted sample  additional confirmatory testing may be necessary for epidemiological  and / or clinical management purposes  to differentiate between  SARS-CoV-2 and other Sarbecovirus currently known to infect humans.  If clinically indicated additional testing with an alternate test  methodology [phone]) is advised. The SARS-CoV-2 RNA is generally  detectable in upper and lower respiratory sp ecimens during the acute  phase of infection. The expected result is Negative. Fact Sheet for Patients:  StrictlyIdeas.no Fact Sheet for Healthcare Providers: BankingDealers.co.za This test is not yet approved or cleared by the [place] FDA and has been authorized for detection and/or diagnosis of SARS-CoV-2 by FDA under an Emergency Use Authorization (EUA).  This EUA will remain in effect (meaning this test can be used) for the duration of the COVID-19 declaration under Section 564(b)(1) of the Act, 21 U.S.C. section 360bbb-3(b)(1), unless the authorization is terminated or revoked sooner. Performed at [facility], [address] [place], [place] [address]   MRSA PCR Screening     Status: None   Collection Time: 07/12/19  1:53 AM   Specimen: Nasal Mucosa; Nasopharyngeal  Result Value Ref Range Status   MRSA by PCR NEGATIVE NEGATIVE Final    Comment:        The GeneXpert MRSA Assay (FDA approved for NASAL specimens only), is one component of a comprehensive MRSA colonization surveillance program. It is not intended to diagnose MRSA infection nor to guide or monitor treatment for MRSA infections. Performed at [facility], [address] [place], [place] [address]     Coagulation Studies: Recent Labs    07/12/19 1808  LABPROT 15.2  INR 1.2    Urinalysis: No results for input(s): COLORURINE, LABSPEC, PHURINE, GLUCOSEU, HGBUR, BILIRUBINUR, KETONESUR,  PROTEINUR, UROBILINOGEN, NITRITE, LEUKOCYTESUR in the last 72 hours.  Invalid input(s): APPERANCEUR    Imaging: Dg Chest 1 View  Result Date: 07/11/2019 CLINICAL DATA:  Fall EXAM: CHEST  1 VIEW COMPARISON:  January 25, 2019 FINDINGS: The heart size is enlarged. Aortic calcifications are noted. Vascular calcifications are noted. There is vascular congestion without overt pulmonary edema. There are few airspace opacities at the lung bases bilaterally favored to represent atelectasis with an infiltrate not entirely excluded. There is no pneumothorax. There is no definite acute osseous abnormality. IMPRESSION: 1. No acute cardiopulmonary process. 2. Cardiomegaly with vascular congestion. 3. Subtle bibasilar airspace opacities favored to represent atelectasis or scarring. An infiltrate is not excluded. Electronically Signed   By: Constance Holster M.D.   On: 07/11/2019 17:52   Dg Shoulder Right  Result Date: 07/11/2019 CLINICAL DATA:  Fall with shoulder pain EXAM: RIGHT SHOULDER - 2+ VIEW COMPARISON:  None. FINDINGS: There is widening of the acromioclavicular interval to 15 mm. There is irregularity at the inferior margin of the glenoid fossa. No fracture of the humerus. IMPRESSION: Widening of the acromioclavicular interval to 15  mm. Possible fracture of the inferior margin of the glenoid fossa. CT of the shoulder may be helpful for further characterization. Electronically Signed   By: Ulyses Jarred M.D.   On: 07/11/2019 19:54   Ct Head Wo Contrast  Result Date: 07/11/2019 CLINICAL DATA:  73 year old male with fall out of bed. EXAM: CT HEAD WITHOUT CONTRAST CT CERVICAL SPINE WITHOUT CONTRAST TECHNIQUE: Multidetector CT imaging of the head and cervical spine was performed following the standard protocol without intravenous contrast. Multiplanar CT image reconstructions of the cervical spine were also generated. COMPARISON:  Head CT dated 02/22/2017 FINDINGS: CT HEAD FINDINGS Brain: There is mild  age-related atrophy and chronic microvascular ischemic changes. 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 There is no acute intracranial hemorrhage. No mass effect or midline shift. No extra-axial fluid collection. Vascular: No hyperdense vessel or unexpected calcification. Skull: Normal. Negative for fracture or focal lesion. Sinuses/Orbits: Mild mucoperiosteal thickening of paranasal sinuses. No air-fluid levels. Other: Scattered areas of scalp calcification. Small scalp hematoma over the vertex. CT CERVICAL SPINE FINDINGS Alignment: No acute subluxation. There is mild reversal of normal cervical lordosis which may be positional or due to muscle spasm or secondary to degenerative changes. Skull base and vertebrae: No acute fracture. Osteopenia. Soft tissues and spinal canal: No prevertebral fluid or swelling. No visible canal hematoma. Disc levels: Extensive multilevel degenerative changes with endplate irregularities and disc space narrowing. Degenerative changes of the odontoid and atlanto odontoid space. Multilevel facet arthropathy most prominent involving the C1-C2 lateral processes. Upper chest: Negative. Other: Atherosclerotic calcification of the aortic arch as well as bilateral carotid bulb calcified plaques. IMPRESSION: 1. No acute intracranial hemorrhage. 2. Mild age-related atrophy and chronic microvascular ischemic changes. 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 3. No acute/traumatic cervical spine pathology. Extensive degenerative changes. Electronically Signed   By: Anner Crete M.D.   On: 07/11/2019 21:02   Ct Cervical Spine Wo Contrast  Result Date: 07/11/2019 CLINICAL DATA:  73 year old male with fall out of bed. EXAM: CT HEAD WITHOUT CONTRAST CT CERVICAL SPINE WITHOUT CONTRAST TECHNIQUE: Multidetector CT imaging of the head and cervical spine was performed following the standard protocol without intravenous  contrast. Multiplanar CT image reconstructions of the cervical spine were also generated. COMPARISON:  Head CT dated 02/22/2017 FINDINGS: CT HEAD FINDINGS Brain: There is mild age-related atrophy and chronic microvascular ischemic changes. 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 There is no acute intracranial hemorrhage. No mass effect or midline shift. No extra-axial fluid collection. Vascular: No hyperdense vessel or unexpected calcification. Skull: Normal. Negative for fracture or focal lesion. Sinuses/Orbits: Mild mucoperiosteal thickening of paranasal sinuses. No air-fluid levels. Other: Scattered areas of scalp calcification. Small scalp hematoma over the vertex. CT CERVICAL SPINE FINDINGS Alignment: No acute subluxation. There is mild reversal of normal cervical lordosis which may be positional or due to muscle spasm or secondary to degenerative changes. Skull base and vertebrae: No acute fracture. Osteopenia. Soft tissues and spinal canal: No prevertebral fluid or swelling. No visible canal hematoma. Disc levels: Extensive multilevel degenerative changes with endplate irregularities and disc space narrowing. Degenerative changes of the odontoid and atlanto odontoid space. Multilevel facet arthropathy most prominent involving the C1-C2 lateral processes. Upper chest: Negative. Other: Atherosclerotic calcification of the aortic arch as well as bilateral carotid bulb calcified plaques. IMPRESSION: 1. No acute intracranial hemorrhage. 2. Mild age-related atrophy and chronic microvascular ischemic changes. 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 3. No acute/traumatic cervical spine pathology. Extensive degenerative changes. Electronically Signed   By: Anner Crete M.D.   On: 07/11/2019 21:02   Mr Thoracic Spine Wo Contrast  Result Date: 07/11/2019 CLINICAL DATA:  T9-10 abnormality. Recent fall with back pain. In stage renal  disease. EXAM: MRI THORACIC AND LUMBAR SPINE WITHOUT CONTRAST TECHNIQUE: Multiplanar and multiecho pulse sequences of the thoracic and lumbar spine were obtained without intravenous contrast. COMPARISON:  CT chest 01/25/2019, CT abdomen pelvis 07/11/2019. FINDINGS: MRI THORACIC SPINE FINDINGS Alignment:  Physiologic. Vertebrae: No acute fracture. There is abnormal hyperintense T2-weighted signal at the endplates T9 and W23 with mild edema of the disc space. T1-weighted signal is predominantly normal. 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 Cord:  Normal signal and morphology. Paraspinal and other soft tissues: Numerous bilateral renal cysts. There is a small amount of prevertebral fluid at the T8-T11 levels. Disc levels: And T9-10, there is a small disc bulge the narrows the ventral thecal sac. Otherwise, no visible disc herniation. No spinal canal stenosis. MRI LUMBAR SPINE FINDINGS Segmentation:  Standard. Alignment:  Physiologic. Vertebrae: Heterogeneous bone marrow signal. There are type 2 Modic degenerative endplate signal changes at L4-L5. Conus medullaris and cauda equina: Conus extends to the L1 level. Conus and cauda equina appear normal. Paraspinal and other soft tissues: Diffuse cystic change of the kidneys. Disc levels: T12-L1: Normal. L1-2: Normal. L2-3: Moderate diffuse disc bulge with severe spinal canal stenosis and severe bilateral neural foraminal stenosis. L3-4: Small disc bulge with mild spinal canal stenosis. No neural foraminal stenosis. L4-5: Disc bulge and endplate spurring with severe facet hypertrophy. Severe spinal canal and bilateral neural foraminal stenosis. L5-S1: Mild facet hypertrophy.  No stenosis. IMPRESSION: 1. 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 Infection is considered unlikely given the minimal change  since 01/25/2019. 2. 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 Electronically Signed   By: Ulyses Jarred M.D.   On: 07/11/2019 23:43   Mr Lumbar Spine Wo Contrast  Result Date: 07/11/2019 CLINICAL DATA:  T9-10 abnormality. Recent fall with back pain. In stage renal disease. EXAM: MRI THORACIC AND LUMBAR SPINE WITHOUT CONTRAST TECHNIQUE: Multiplanar and multiecho pulse sequences of the thoracic and lumbar spine were obtained without intravenous contrast. COMPARISON:  CT chest 01/25/2019, CT abdomen pelvis 07/11/2019. FINDINGS: MRI THORACIC SPINE FINDINGS Alignment:  Physiologic. Vertebrae: No acute fracture. There is abnormal hyperintense T2-weighted signal at the endplates T9 and J62 with mild edema of the disc space. T1-weighted signal is predominantly normal. 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 Cord:  Normal signal and morphology. Paraspinal and other soft tissues: Numerous bilateral renal cysts. There is a small amount of prevertebral fluid at the T8-T11 levels. Disc levels: And T9-10, there is a small disc bulge the narrows the ventral thecal sac. Otherwise, no visible disc herniation. No spinal canal stenosis. MRI LUMBAR SPINE FINDINGS Segmentation:  Standard. Alignment:  Physiologic. Vertebrae: Heterogeneous bone marrow signal. There are type 2 Modic degenerative endplate signal changes at L4-L5. Conus medullaris and cauda equina: Conus extends to the L1 level. Conus and cauda equina appear normal. Paraspinal and other soft tissues: Diffuse cystic change of the kidneys. Disc levels: T12-L1: Normal. L1-2: Normal. L2-3: Moderate diffuse disc bulge with severe spinal canal stenosis and severe bilateral neural foraminal stenosis. L3-4: Small disc  bulge with mild spinal canal stenosis. No neural foraminal stenosis. L4-5: Disc bulge and endplate spurring with severe facet hypertrophy. Severe spinal canal and bilateral  neural foraminal stenosis. L5-S1: Mild facet hypertrophy.  No stenosis. IMPRESSION: 1. 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 Infection is considered unlikely given the minimal change since 01/25/2019. 2. 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 Electronically Signed   By: Ulyses Jarred M.D.   On: 07/11/2019 23:43   Ct Abdomen Pelvis W Contrast  Result Date: 07/11/2019 CLINICAL DATA:  73 year old who states he fell out of bed and laid on the floor for the past 3 days. Patient complains of RIGHT UPPER QUADRANT abdominal pain, RIGHT shoulder pain, RIGHT hip pain and low back pain. Initial encounter. Current history of end-stage renal disease on hemodialysis. EXAM: CT ABDOMEN AND PELVIS WITH CONTRAST TECHNIQUE: Multidetector CT imaging of the abdomen and pelvis was performed using the standard protocol following bolus administration of intravenous contrast. CONTRAST:  115mL OMNIPAQUE IOHEXOL 300 MG/ML IV. COMPARISON:  CTA abdomen and pelvis 01/27/2017. CT abdomen and pelvis 07/20/2010. FINDINGS: Respiratory motion blurred images of the lung bases and upper abdomen. Lower chest: Expected dependent atelectasis posteriorly in the lower lobes. Visualized lung bases otherwise clear. Heart moderately enlarged. Mitral annular calcification. Hepatobiliary: Liver normal in size and appearance. Gallstones in the gallbladder, the largest measuring approximately 8 mm. Small polyps versus invaginating folds in the gallbladder wall. No pericholecystic edema or inflammation. No biliary ductal dilation. Pancreas: Mildly dilated main pancreatic duct up to approximately 6 mm diameter, unchanged since the 2018 CTA. No pancreatic masses. Spleen: Calcified granuloma in the UPPER pole. Normal in size without significant parenchymal abnormality. Adrenals/Urinary Tract:  Normal appearing adrenal glands. Polycystic kidney disease of chronic hemodialysis as noted previously. Minimally enhancing functional renal parenchyma bilaterally. No solid renal masses. Urinary bladder chronically decompressed accounting for its thickened wall. Stomach/Bowel: Small hiatal hernia. Stomach otherwise normal in appearance. Normal-appearing small bowel. Moderate stool burden throughout the colon. Extensive diffuse colonic diverticulosis without evidence of acute diverticulitis. Normal appendix in the RIGHT mid abdomen. Vascular/Lymphatic: Severe aortoiliofemoral atherosclerosis without evidence of aneurysm. Severe visceral artery atherosclerosis. No pathologic lymphadenopathy. Reproductive: Moderate prostate gland enlargement. Normal seminal vesicles. Calcification of the vasa deferens. Other: Haziness/edema involving the retroperitoneum from the approximate level of the celiac trunk to the level of the aortic bifurcation. Musculoskeletal: Loss of the cortical margins of the endplates adjacent to the T9-10 disc, new since the prior CTA. Generalized osteosclerosis indicating renal osteodystrophy. Severe degenerative disc disease at L4-5. Severe diffuse facet degenerative changes involving the lumbar spine. Severe degenerative changes involving the hip joints. IMPRESSION: 1. Haziness/edema involving the retroperitoneum from the approximate level of the celiac trunk to the level of the aortic bifurcation. Retroperitoneal fibrosis is suspected, possibly in the early acute inflammatory stages. 2. Loss of the cortical margins of the endplates adjacent to the T9-10 disc, query discitis/osteomyelitis. MRI of the thoracolumbar spine may be helpful in further evaluation to confirm or deny this finding. 3. Extensive diffuse colonic diverticulosis without evidence of acute diverticulitis. 4. Cholelithiasis without CT evidence of acute cholecystitis. Small polyps versus involuting folds in the gallbladder wall. No  biliary ductal dilation. 5. Small hiatal hernia. 6. Generalized osteosclerosis indicating renal osteodystrophy. 7. Moderate prostate gland enlargement. Aortic Atherosclerosis (ICD10-170.0) Electronically Signed   By: Evangeline Dakin M.D.   On: 07/11/2019 21:03  Medications:    . amLODipine  10 mg Oral QHS  . apixaban  5 mg Oral BID  . atorvastatin  40 mg Oral q1800  . calcium carbonate  400 mg of elemental calcium Oral TID  . Chlorhexidine Gluconate Cloth  6 each Topical Q0600  . famotidine  20 mg Oral Daily  . furosemide  40 mg Intravenous Q12H  . gabapentin  300 mg Oral Daily  . hydrALAZINE  25 mg Oral TID  . multivitamin  1 tablet Oral Daily  . ramipril  10 mg Oral Daily  . sevelamer carbonate  2,400 mg Oral TID WC  . traMADol  100 mg Oral Q12H   acetaminophen **OR** acetaminophen, magnesium hydroxide, ondansetron **OR** ondansetron (ZOFRAN) IV, traZODone  Assessment/ Plan:  73 y.o. male  with end-stage renal disease, hypertension, hyperlipidemia, degenerative disc disease, anemia chronic kidney disease, secondary hyperparathyroidism, admitted now for pneumonia.  [facility]/[address]/MWF  1.  ESRD on HD MWF.    Patient underwent dialysis yesterday.  No acute indication for dialysis today.  Next dialysis treatment tomorrow if still here.  2.  Anemia of chronic kidney disease.  Patient to continue Epogen as an outpatient.  3.  Secondary hyperparathyroidism.  Phosphorus was a bit high at 7.0.  Maintain the patient Renvela 24 mg p.o. 3 times daily with meals.  4.  Hypertension.  Maintain the patient on ramipril and hydralazine.  5.  Status post fall.  Continue physical therapy.   LOS: 2 Estreya Clay 7/21/202011:28 AM

## 2019-07-13 NOTE — Progress Notes (Signed)
Progress Note  Patient Name: Brian Fleming Date of Encounter: 07/13/2019  Primary Cardiologist: New - Tayana Shankle  Subjective   Patient feels well; no CP, shortness of breath, palpitations, or lightheadedness.  He tolerated HD well yesterday.  Inpatient Medications    Scheduled Meds:  amLODipine  10 mg Oral QHS   apixaban  5 mg Oral BID   atorvastatin  40 mg Oral q1800   calcium carbonate  400 mg of elemental calcium Oral TID   Chlorhexidine Gluconate Cloth  6 each Topical Q0600   famotidine  20 mg Oral Daily   gabapentin  300 mg Oral Daily   hydrALAZINE  25 mg Oral TID   multivitamin  1 tablet Oral Daily   ramipril  10 mg Oral Daily   sevelamer carbonate  2,400 mg Oral TID WC   traMADol  100 mg Oral Q12H   Continuous Infusions:  PRN Meds: acetaminophen **OR** acetaminophen, magnesium hydroxide, ondansetron **OR** ondansetron (ZOFRAN) IV, traZODone   Vital Signs    Vitals:   07/12/19 1945 07/13/19 0454 07/13/19 0808 07/13/19 0900  BP: 130/72 129/77 131/74   Pulse: 79 (!) 58 (!) 47 61  Resp: 14 16 16    Temp: 98.9 F (37.2 C) 98.6 F (37 C) 98.6 F (37 C)   TempSrc: Oral Oral Oral   SpO2: 99% 94% 99%   Weight:      Height:        Intake/Output Summary (Last 24 hours) at 07/13/2019 1332 Last data filed at 07/13/2019 1010 Gross per 24 hour  Intake 240 ml  Output 1519 ml  Net -1279 ml   Last 3 Weights 07/12/2019 07/12/2019 07/12/2019  Weight (lbs) 183 lb (No Data) (No Data)  Weight (kg) 83.008 kg (No Data) (No Data)      Telemetry    Atrial flutter with variable block (HR 47-80 bpm) - Personally Reviewed  ECG    Atrial flutter with variable block and inferolateral T wave inversions - Personally Reviewed  Physical Exam   GEN: No acute distress.   Neck: No JVD Cardiac: Irregular, no murmurs, rubs, or gallops.  Respiratory: Clear to auscultation bilaterally. GI: Soft, nontender, non-distended  MS: No edema; No deformity. Neuro:  Nonfocal    Psych: Normal affect   Labs    High Sensitivity Troponin:   Recent Labs  Lab 07/11/19 1711 07/11/19 1957 07/12/19 0223  TROPONINIHS 54* 55* 64*      Cardiac EnzymesNo results for input(s): TROPONINI in the last 168 hours. No results for input(s): TROPIPOC in the last 168 hours.   Chemistry Recent Labs  Lab 07/11/19 1711 07/12/19 0223  NA 138 139  K 4.5 4.4  CL 93* 96*  CO2 27 28  GLUCOSE 75 129*  BUN 39* 46*  CREATININE 9.98* 10.92*  CALCIUM 8.0* 7.7*  PROT 7.7  --   ALBUMIN 3.7 3.5  AST 19  --   ALT 9  --   ALKPHOS 75  --   BILITOT 1.0  --   GFRNONAA 5* 4*  GFRAA 5* 5*  ANIONGAP 18* 15     Hematology Recent Labs  Lab 07/11/19 1711 07/12/19 0223  WBC 4.3 4.3  RBC 3.89* 3.45*  HGB 11.9* 10.6*  HCT 36.4* 32.7*  MCV 93.6 94.8  MCH 30.6 30.7  MCHC 32.7 32.4  RDW 13.4 13.4  PLT 168 147*    BNP Recent Labs  Lab 07/11/19 1711 07/12/19 0223 07/13/19 0419  BNP 737.0* 730.0* 508.0*  DDimer No results for input(s): DDIMER in the last 168 hours.   Radiology    Dg Chest 1 View  Result Date: 07/11/2019 CLINICAL DATA:  Fall EXAM: CHEST  1 VIEW COMPARISON:  January 25, 2019 FINDINGS: The heart size is enlarged. Aortic calcifications are noted. Vascular calcifications are noted. There is vascular congestion without overt pulmonary edema. There are few airspace opacities at the lung bases bilaterally favored to represent atelectasis with an infiltrate not entirely excluded. There is no pneumothorax. There is no definite acute osseous abnormality. IMPRESSION: 1. No acute cardiopulmonary process. 2. Cardiomegaly with vascular congestion. 3. Subtle bibasilar airspace opacities favored to represent atelectasis or scarring. An infiltrate is not excluded. Electronically Signed   By: Constance Holster M.D.   On: 07/11/2019 17:52   Dg Shoulder Right  Result Date: 07/11/2019 CLINICAL DATA:  Fall with shoulder pain EXAM: RIGHT SHOULDER - 2+ VIEW COMPARISON:  None.  FINDINGS: There is widening of the acromioclavicular interval to 15 mm. There is irregularity at the inferior margin of the glenoid fossa. No fracture of the humerus. IMPRESSION: Widening of the acromioclavicular interval to 15 mm. Possible fracture of the inferior margin of the glenoid fossa. CT of the shoulder may be helpful for further characterization. Electronically Signed   By: Ulyses Jarred M.D.   On: 07/11/2019 19:54   Ct Head Wo Contrast  Result Date: 07/11/2019 CLINICAL DATA:  73 year old male with fall out of bed. EXAM: CT HEAD WITHOUT CONTRAST CT CERVICAL SPINE WITHOUT CONTRAST TECHNIQUE: Multidetector CT imaging of the head and cervical spine was performed following the standard protocol without intravenous contrast. Multiplanar CT image reconstructions of the cervical spine were also generated. COMPARISON:  Head CT dated 02/22/2017 FINDINGS: CT HEAD FINDINGS Brain: There is mild age-related atrophy and chronic microvascular ischemic changes. 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 There is no acute intracranial hemorrhage. No mass effect or midline shift. No extra-axial fluid collection. Vascular: No hyperdense vessel or unexpected calcification. Skull: Normal. Negative for fracture or focal lesion. Sinuses/Orbits: Mild mucoperiosteal thickening of paranasal sinuses. No air-fluid levels. Other: Scattered areas of scalp calcification. Small scalp hematoma over the vertex. CT CERVICAL SPINE FINDINGS Alignment: No acute subluxation. There is mild reversal of normal cervical lordosis which may be positional or due to muscle spasm or secondary to degenerative changes. Skull base and vertebrae: No acute fracture. Osteopenia. Soft tissues and spinal canal: No prevertebral fluid or swelling. No visible canal hematoma. Disc levels: Extensive multilevel degenerative changes with endplate irregularities and disc space narrowing. Degenerative changes of the odontoid  and atlanto odontoid space. Multilevel facet arthropathy most prominent involving the C1-C2 lateral processes. Upper chest: Negative. Other: Atherosclerotic calcification of the aortic arch as well as bilateral carotid bulb calcified plaques. IMPRESSION: 1. No acute intracranial hemorrhage. 2. Mild age-related atrophy and chronic microvascular ischemic changes. 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 3. No acute/traumatic cervical spine pathology. Extensive degenerative changes. Electronically Signed   By: Anner Crete M.D.   On: 07/11/2019 21:02   Ct Cervical Spine Wo Contrast  Result Date: 07/11/2019 CLINICAL DATA:  73 year old male with fall out of bed. EXAM: CT HEAD WITHOUT CONTRAST CT CERVICAL SPINE WITHOUT CONTRAST TECHNIQUE: Multidetector CT imaging of the head and cervical spine was performed following the standard protocol without intravenous contrast. Multiplanar CT image reconstructions of the cervical spine were also generated. COMPARISON:  Head CT dated 02/22/2017 FINDINGS: CT HEAD FINDINGS Brain: There is mild  age-related atrophy and chronic microvascular ischemic changes. 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 There is no acute intracranial hemorrhage. No mass effect or midline shift. No extra-axial fluid collection. Vascular: No hyperdense vessel or unexpected calcification. Skull: Normal. Negative for fracture or focal lesion. Sinuses/Orbits: Mild mucoperiosteal thickening of paranasal sinuses. No air-fluid levels. Other: Scattered areas of scalp calcification. Small scalp hematoma over the vertex. CT CERVICAL SPINE FINDINGS Alignment: No acute subluxation. There is mild reversal of normal cervical lordosis which may be positional or due to muscle spasm or secondary to degenerative changes. Skull base and vertebrae: No acute fracture. Osteopenia. Soft tissues and spinal canal: No prevertebral fluid or swelling. No  visible canal hematoma. Disc levels: Extensive multilevel degenerative changes with endplate irregularities and disc space narrowing. Degenerative changes of the odontoid and atlanto odontoid space. Multilevel facet arthropathy most prominent involving the C1-C2 lateral processes. Upper chest: Negative. Other: Atherosclerotic calcification of the aortic arch as well as bilateral carotid bulb calcified plaques. IMPRESSION: 1. No acute intracranial hemorrhage. 2. Mild age-related atrophy and chronic microvascular ischemic changes. 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 3. No acute/traumatic cervical spine pathology. Extensive degenerative changes. Electronically Signed   By: Anner Crete M.D.   On: 07/11/2019 21:02   Mr Thoracic Spine Wo Contrast  Result Date: 07/11/2019 CLINICAL DATA:  T9-10 abnormality. Recent fall with back pain. In stage renal disease. EXAM: MRI THORACIC AND LUMBAR SPINE WITHOUT CONTRAST TECHNIQUE: Multiplanar and multiecho pulse sequences of the thoracic and lumbar spine were obtained without intravenous contrast. COMPARISON:  CT chest 01/25/2019, CT abdomen pelvis 07/11/2019. FINDINGS: MRI THORACIC SPINE FINDINGS Alignment:  Physiologic. Vertebrae: No acute fracture. There is abnormal hyperintense T2-weighted signal at the endplates T9 and U13 with mild edema of the disc space. T1-weighted signal is predominantly normal. 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 Cord:  Normal signal and morphology. Paraspinal and other soft tissues: Numerous bilateral renal cysts. There is a small amount of prevertebral fluid at the T8-T11 levels. Disc levels: And T9-10, there is a small disc bulge the narrows the ventral thecal sac. Otherwise, no visible disc herniation. No spinal canal stenosis. MRI LUMBAR SPINE FINDINGS Segmentation:  Standard. Alignment:  Physiologic. Vertebrae: Heterogeneous bone marrow signal. There are  type 2 Modic degenerative endplate signal changes at L4-L5. Conus medullaris and cauda equina: Conus extends to the L1 level. Conus and cauda equina appear normal. Paraspinal and other soft tissues: Diffuse cystic change of the kidneys. Disc levels: T12-L1: Normal. L1-2: Normal. L2-3: Moderate diffuse disc bulge with severe spinal canal stenosis and severe bilateral neural foraminal stenosis. L3-4: Small disc bulge with mild spinal canal stenosis. No neural foraminal stenosis. L4-5: Disc bulge and endplate spurring with severe facet hypertrophy. Severe spinal canal and bilateral neural foraminal stenosis. L5-S1: Mild facet hypertrophy.  No stenosis. IMPRESSION: 1. 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 Infection is considered unlikely given the minimal change since 01/25/2019. 2. 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 Electronically Signed   By: Ulyses Jarred M.D.   On: 07/11/2019 23:43   Mr Lumbar Spine Wo Contrast  Result Date: 07/11/2019 CLINICAL DATA:  T9-10 abnormality. Recent fall with back pain. In stage renal disease. EXAM: MRI THORACIC AND LUMBAR SPINE WITHOUT CONTRAST TECHNIQUE: Multiplanar and multiecho pulse sequences of the thoracic and lumbar spine  were obtained without intravenous contrast. COMPARISON:  CT chest 01/25/2019, CT abdomen pelvis 07/11/2019. FINDINGS: MRI THORACIC SPINE FINDINGS Alignment:  Physiologic. Vertebrae: No acute fracture. There is abnormal hyperintense T2-weighted signal at the endplates T9 and U27 with mild edema of the disc space. T1-weighted signal is predominantly normal. 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 Cord:  Normal signal and morphology. Paraspinal and other soft tissues: Numerous bilateral renal cysts. There is a small amount of prevertebral  fluid at the T8-T11 levels. Disc levels: And T9-10, there is a small disc bulge the narrows the ventral thecal sac. Otherwise, no visible disc herniation. No spinal canal stenosis. MRI LUMBAR SPINE FINDINGS Segmentation:  Standard. Alignment:  Physiologic. Vertebrae: Heterogeneous bone marrow signal. There are type 2 Modic degenerative endplate signal changes at L4-L5. Conus medullaris and cauda equina: Conus extends to the L1 level. Conus and cauda equina appear normal. Paraspinal and other soft tissues: Diffuse cystic change of the kidneys. Disc levels: T12-L1: Normal. L1-2: Normal. L2-3: Moderate diffuse disc bulge with severe spinal canal stenosis and severe bilateral neural foraminal stenosis. L3-4: Small disc bulge with mild spinal canal stenosis. No neural foraminal stenosis. L4-5: Disc bulge and endplate spurring with severe facet hypertrophy. Severe spinal canal and bilateral neural foraminal stenosis. L5-S1: Mild facet hypertrophy.  No stenosis. IMPRESSION: 1. 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 Infection is considered unlikely given the minimal change since 01/25/2019. 2. 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 Electronically Signed   By: Ulyses Jarred M.D.   On: 07/11/2019 23:43   Ct Abdomen Pelvis W Contrast  Result Date: 07/11/2019 CLINICAL DATA:  73 year old who states he fell out of bed and laid on the floor for the past 3 days. Patient complains of RIGHT UPPER QUADRANT abdominal pain, RIGHT shoulder pain, RIGHT hip pain and low back pain. Initial encounter. Current history of Re[place]ienta-stage renal disease on hemodialysis. EXAM: CT ABDOMEN AND PELVIS WITH CONTRAST TECHNIQUE: Multidetector CT imaging of the abdomen and pelvis was performed using the standard protocol following bolus administration of intravenous contrast. CONTRAST:   147mL OMNIPAQUE IOHEXOL 300 MG/ML IV. COMPARISON:  CTA abdomen and pelvis 01/27/2017. CT abdomen and pelvis 07/20/2010. FINDINGS: Respiratory motion blurred images of the lung bases and upper abdomen. Lower chest: Expected dependent atelectasis posteriorly in the lower lobes. Visualized lung bases otherwise clear. Heart moderately enlarged. Mitral annular calcification. Hepatobiliary: Liver normal in size and appearance. Gallstones in the gallbladder, the largest measuring approximately 8 mm. Small polyps versus invaginating folds in the gallbladder wall. No pericholecystic edema or inflammation. No biliary ductal dilation. Pancreas: Mildly dilated main pancreatic duct up to approximately 6 mm diameter, unchanged since the 2018 CTA. No pancreatic masses. Spleen: Calcified granuloma in the UPPER pole. Normal in size without significant parenchymal abnormality. Adrenals/Urinary Tract: Normal appearing adrenal glands. Polycystic kidney disease of chronic hemodialysis as noted previously. Minimally enhancing functional renal parenchyma bilaterally. No solid renal masses. Urinary bladder chronically decompressed accounting for its thickened wall. Stomach/Bowel: Small hiatal hernia. Stomach otherwise normal in appearance. Normal-appearing small bowel. Moderate stool burden throughout the colon. Extensive diffuse colonic diverticulosis without evidence of acute diverticulitis. Normal appendix in the RIGHT mid abdomen. Vascular/Lymphatic: Severe aortoiliofemoral atherosclerosis without evidence of aneurysm. Severe visceral artery atherosclerosis. No pathologic lymphadenopathy. Reproductive: Moderate prostate gland enlargement. Normal seminal vesicles. Calcification of the vasa deferens. Other: Haziness/edema involving the retroperitoneum  from the approximate level of the celiac trunk to the level of the aortic bifurcation. Musculoskeletal: Loss of the cortical margins of the endplates adjacent to the T9-10 disc, new since  the prior CTA. Generalized osteosclerosis indicating renal osteodystrophy. Severe degenerative disc disease at L4-5. Severe diffuse facet degenerative changes involving the lumbar spine. Severe degenerative changes involving the hip joints. IMPRESSION: 1. Haziness/edema involving the retroperitoneum from the approximate level of the celiac trunk to the level of the aortic bifurcation. Retroperitoneal fibrosis is suspected, possibly in the early acute inflammatory stages. 2. Loss of the cortical margins of the endplates adjacent to the T9-10 disc, query discitis/osteomyelitis. MRI of the thoracolumbar spine may be helpful in further evaluation to confirm or deny this finding. 3. Extensive diffuse colonic diverticulosis without evidence of acute diverticulitis. 4. Cholelithiasis without CT evidence of acute cholecystitis. Small polyps versus involuting folds in the gallbladder wall. No biliary ductal dilation. 5. Small hiatal hernia. 6. Generalized osteosclerosis indicating renal osteodystrophy. 7. Moderate prostate gland enlargement. Aortic Atherosclerosis (ICD10-170.0) Electronically Signed   By: Evangeline Dakin M.D.   On: 07/11/2019 21:03    Cardiac Studies   Echo (07/12/2019):  1. The left ventricle has normal systolic function, with an ejection fraction of 55-60%. The cavity size was normal. There is mildly increased left ventricular wall thickness. Left ventricular diastolic function could not be evaluated.  2. The right ventricle has moderately reduced systolic function. The cavity was moderately enlarged. There is mildly increased right ventricular wall thickness. Right ventricular systolic pressure is mildly to moderately elevated with an estimated  pressure of 43.9 mmHg.  3. Left atrial size was mild-moderately dilated.  4. Right atrial size was mildly dilated.  5. The mitral valve is degenerative. Mild thickening of the mitral valve leaflet. Mild calcification of the mitral valve leaflet. There  is moderate mitral annular calcification present.  6. The aortic valve is tricuspid. Mild thickening of the aortic valve. Mild calcification of the aortic valve.  7. The aortic root is normal in size and structure.  8. The inferior vena cava was dilated in size with >50% respiratory variability.  Patient Profile     73 y.o. male with history of PAD, ESRD, prior stroke, anemia of chronic disease, HTN, HLD, and DJD, admitted after falling out of bed and found to be in atrial flutter since at least 2016.  Assessment & Plan    Elevated troponin: No chest pain reported; most likely due to rhabdomyolysis from fall and ESRD, though element of supply-demand mismatch cannot be excluded.  Presentation is not consistent with ACS.  Continue apixaban in lieu of aspirin.  Consider outpatient pharmacologic myocardial perfusion stress test.  Atrial flutter: Long-standing on review of EKG's (since at least 2016) but not diagnosed in the past.  HR normal to slightly low (asymptomatic) off AV nodal blocking agents.  Tele/EKG reviewed by EP.  Continue apixaban 5 mg BID.  Check magnesium and TSH.  ESRD:  Continued management per nephrology.  [facility] will sign off.   Medication Recommendations:  apixaban 5 mg BID Other recommendations (labs, testing, etc):  Magnesium level and TSH (ordered) Follow up as an outpatient:  ~2 weeks in the office with me or an APP  For questions or updates, please contact [facility] Please consult [email or web address] for contact info under [facility].  Signed, Nelva Bush, MD  07/13/2019, 1:32 PM

## 2019-07-14 LAB — BRAIN NATRIURETIC PEPTIDE: B Natriuretic Peptide: 354 pg/mL — ABNORMAL HIGH (ref 0.0–100.0)

## 2019-07-14 MED ORDER — TRAMADOL HCL 50 MG PO TABS: 100.0000 mg | ORAL_TABLET | Freq: Two times a day (BID) | ORAL | 0 refills | Status: DC

## 2019-07-14 MED ORDER — APIXABAN 5 MG PO TABS: 5.0000 mg | ORAL_TABLET | Freq: Two times a day (BID) | ORAL | 0 refills | Status: AC

## 2019-07-14 NOTE — Evaluation (Signed)
Occupational Therapy Evaluation Patient Details Name: Brian Fleming MRN: [id number] DOB: 10/18/1946 Today's Date: 07/14/2019    History of Present Illness 73 y.o male who arrived at hospital ED on 07/11/2019 via EMS after falling out of bed 07/09/2019 and laying on the floor. HD port on left forearm. He was admited to the hospital for fall, elevated troponin, end-stage renal disease on hemodialysis, elevated BNP, mild acute on chronic CHF, and back pain (likely chronic). Cardiology consult sugessts troponin elevation is in setting of rhabdomyolysis/fall. PMH also includes hx of back surgery, atrial flutter, HTN. 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 ABnromal T9-10 endplate unklikely to be infection. 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 Negative for acute/traumatic c-spine pathology. X-ray of R shoulder shows AC joint separation and possible fx of inferior margin of the glenoid fossa. Chest radiograph show cardiomegaly with vascular congestin and possible atelectasis or scarring.   Clinical Impression   Brian Fleming was seen for OT evaluation this date. Prior to hospital admission, pt was generally independent in ADL tasks, using a RW or SPC for community mobiliyt. He requires assistance for IADLs such as medication mgt, cleaning, and driving. Pt states he has someone who visits daily to assist him with cleaning and organizing his medications. Pt enjoys cooking for himself.  Pt lives alone in a 1-level apartment with one step to enter.  Currently pt demonstrates impairments in strength, functional mobility, safety awareness and functional UE use requiring min assist for ADLs and mobility.  Pt would benefit from skilled OT to address  noted impairments and functional limitations (see below for any additional details) in order to maximize safety and independence while minimizing falls risk and caregiver burden.  Upon hospital discharge, recommend HHOT to support this pt's safe return to meaningful occupations of daily life within his natural context.     Follow Up Recommendations  Home health OT;Supervision - Intermittent    Equipment Recommendations  Other (comment)(Attachable bed railing.)    Recommendations for Other Services       Precautions / Restrictions Precautions Precautions: Fall Precaution Comments: possible R glenoid fx, R AC separation (no official precautions listed for shoulder) Restrictions Weight Bearing Restrictions: No      Mobility Bed Mobility Overal bed mobility: Modified Independent             General bed mobility comments: No assist needed to get to EOB, light use of rail  Transfers Overall transfer level: Needs assistance Equipment used: Rolling walker (2 wheeled) Transfers: Sit to/from Stand Sit to Stand: Min assist;Min guard         General transfer comment: Very forward flexed lean to get to standing, heavy use of UEs to rise to partial standing (remained forward flexed over walker during amb). Required VC's for technique.    Balance Overall balance assessment: Needs assistance Sitting-balance support: Feet supported;Single extremity supported Sitting balance-Leahy Scale: Good Sitting balance - Comments: patient steady sitting at edge of bed.   Standing balance support: Bilateral upper extremity supported;During functional activity Standing balance-Leahy Scale: Fair                             ADL either performed or assessed with clinical judgement   ADL Overall ADL's : Needs assistance/impaired  Eating/Feeding: Set up;Sitting Eating/Feeding Details (indicate cue type and reason): Pt states he has baseline challenges with self-feeding 2/2 arthritic  changes in BUE. When asked if he regularly drops his utensils pt stated, "Yep all the time, and I just pick them back up again". Grooming: Set up;Sitting;Minimal assistance   Upper Body Bathing: Sitting;Minimal assistance   Lower Body Bathing: Minimal assistance;Moderate assistance;Sitting/lateral leans   Upper Body Dressing : Sitting;Minimal assistance;Set up   Lower Body Dressing: Minimal assistance;Moderate assistance;Sit to/from stand   Toilet Transfer: RW;Ambulation;BSC;Minimal assistance   Toileting- Clothing Manipulation and Hygiene: Minimal assistance;Sit to/from stand;Set up       Functional mobility during ADLs: Minimal assistance;Min guard;Rolling walker       Vision Baseline Vision/History: Wears glasses Wears Glasses: Reading only Patient Visual Report: No change from baseline       Perception     Praxis      Pertinent Vitals/Pain Pain Assessment: 0-10 Pain Score: 6  Pain Location: Pt reports 5 or 6 out of 10 low back pain on this date. Pain Descriptors / Indicators: Aching;Sore Pain Intervention(s): Monitored during session;Limited activity within patient's tolerance;Repositioned     Hand Dominance Left   Extremity/Trunk Assessment Upper Extremity Assessment Upper Extremity Assessment: RUE deficits/detail;LUE deficits/detail RUE Deficits / Details: pt did not complain of R shoulder pain. Able to resist push/pull approx 4/5 bilaterally. Bilateral hands noted with significant arthritic changes including: MCP joints x10  flexed to <90 with very limited AROM. IP joints fully extended in B 1st digits with slight opposition and ulnar deviation. Grip grossly 3/5 with noted small muscle wasting on volar surface. Pt noted to have dry, scabbed, flaky skin acrosed MCP/dorsal surface of RUE. LUE Deficits / Details: See above for comment. LUE slightly more impacted with less AROM than RUE.       Cervical / Trunk Assessment Cervical / Trunk Assessment: Kyphotic    Communication Communication Communication: No difficulties   Cognition Arousal/Alertness: Awake/alert Behavior During Therapy: WFL for tasks assessed/performed Overall Cognitive Status: Within Functional Limits for tasks assessed                                 General Comments: slightly hard of hearing   General Comments  Dry scabbed skin over dorsal surface of RUE    Exercises Other Exercises Other Exercises: Pt educated on safe use of AE for functional mobility. Education limited as pt about to go to daialysis. Would benefit from further education in falls prevention and AE for ADL   Shoulder Instructions      Home Living Family/patient expects to be discharged to:: Private residence Living Arrangements: Alone Available Help at Discharge: Available PRN/intermittently Type of Home: Apartment Home Access: Stairs to enter Entrance Stairs-Number of Steps: 1 Entrance Stairs-Rails: None Home Layout: One level     Bathroom Shower/Tub: Corporate investment banker: [place] - single point;Toilet riser;Walker - 2 wheels;Shower seat;Grab bars - toilet;Grab bars - tub/shower          Prior Functioning/Environment Level of Independence: Needs assistance  Gait / Transfers Assistance Needed: pt reports ambulates in home without AD, uses RW when going to doctor, [facility] when going to dialysis ADL's / Homemaking Assistance Needed: states he is I with ADLs, requires help with some IADLs such as cleaning, transportation, and medication mgt. States he cooks for himself.  OT Problem List: Decreased strength;Decreased coordination;Pain;Decreased range of motion;Decreased activity tolerance;Decreased safety awareness;Decreased knowledge of use of DME or AE;Impaired UE functional use;Impaired balance (sitting and/or standing)      OT Treatment/Interventions: Self-care/ADL training;Modalities;Balance training;Therapeutic  exercise;Therapeutic activities;DME and/or AE instruction;Patient/family education    OT Goals(Current goals can be found in the care plan section) Acute Rehab OT Goals Patient Stated Goal: return home OT Goal Formulation: With patient Time For Goal Achievement: 07/28/19 ADL Goals Pt Will Perform Grooming: sitting;with set-up Pt Will Perform Upper Body Dressing: with modified independence;sitting Pt Will Perform Lower Body Dressing: with set-up;with adaptive equipment;sit to/from stand(With LRAD PRN for improved safety and functional independence.) Pt Will Transfer to Toilet: with supervision;ambulating(With LRAD PRN for improved safety and functional independence.)  OT Frequency: Min 2X/week   Barriers to D/C: Decreased caregiver support          Co-evaluation              AM-PAC OT "6 Clicks" Daily Activity     Outcome Measure Help from another person eating meals?: A Little Help from another person taking care of personal grooming?: A Little Help from another person toileting, which includes using toliet, bedpan, or urinal?: A Little Help from another person bathing (including washing, rinsing, drying)?: A Lot Help from another person to put on and taking off regular upper body clothing?: A Little Help from another person to put on and taking off regular lower body clothing?: A Little 6 Click Score: 17   End of Session Equipment Utilized During Treatment: Gait belt;Rolling walker  Activity Tolerance: Patient tolerated treatment well Patient left: in chair;with call bell/phone within reach;with chair alarm set;with nursing/sitter in room  OT Visit Diagnosis: Other abnormalities of gait and mobility (R26.89);Muscle weakness (generalized) (M62.81);Pain Pain - Right/Left: (back) Pain - part of body: (back)                Time: 4268-3419 OT Time Calculation (min): 17 min Charges:  OT General Charges $OT Visit: 1 Visit OT Evaluation $OT Eval Moderate Complexity: 1  Mod OT Treatments $Self Care/Home Management : 8-22 mins  Shara Blazing, M.S., OTR/L Ascom: [phone] 07/14/19, 10:40 AM

## 2019-07-14 NOTE — Progress Notes (Signed)
Pre-HD Tx:     07/14/19 1015  Vital Signs  Temp 98.2 F (36.8 C)  Temp Source Oral  Pulse Rate (!) 57  Pulse Rate Source Monitor  Resp 12  BP 117/66  BP Location Right Arm  BP Method Automatic  Patient Position (if appropriate) Lying  Oxygen Therapy  SpO2 96 %  O2 Device Room Air  Pain Assessment  Pain Scale 0-10  Pain Score 0  Time-Out for Hemodialysis  What Procedure? HD  Pt Identifiers(min of two) First/Last Name;MRN/Account#;Pt's DOB(use if MRN/Acct# not available  Correct Site? Yes  Correct Side? Yes  Correct Procedure? Yes  Consents Verified? Yes  Rad Studies Available? N/A  Safety Precautions Reviewed? Yes  Engineer, civil (consulting) Number 7  Station Number 2  UF/Alarm Test Passed  Conductivity: Meter 14  Conductivity: Machine  14  pH 7.6  Reverse Osmosis main  Normal Saline Lot Number [id number]  Dialyzer Lot Number [id number]  Disposable Set Lot Number [id number]  Machine Temperature 98.6 F (37 C)  Musician and Audible Yes  Blood Lines Intact and Secured Yes  Pre Treatment Patient Checks  Vascular access used during treatment Fistula  Hepatitis B Surface Antigen Results Negative  Date Hepatitis B Surface Antigen Drawn 10/05/18  Hepatitis B Surface Antibody  (>10)  Date Hepatitis B Surface Antibody Drawn 10/05/18  Hemodialysis Consent Verified Yes  Hemodialysis Standing Orders Initiated Yes  ECG (Telemetry) Monitor On Yes  Prime Ordered Normal Saline  Length of  DialysisTreatment -hour(s) 3.5 Hour(s)  Dialysis Treatment Comments  (NA 140)  Dialyzer Elisio 17H NR  Dialysate 2K;2.5 Ca  Dialysate Flow Ordered 800  Blood Flow Rate Ordered 400 mL/min  Ultrafiltration Goal 1.5 Liters  Dialysis Blood Pressure Support Ordered Normal Saline  Education / Care Plan  Dialysis Education Provided Yes  Documented Education in Care Plan Yes  Outpatient Plan of Care Reviewed and on Chart Yes

## 2019-07-14 NOTE — Progress Notes (Signed)
Pre-HD Assessment:    07/14/19 1015  Neurological  Level of Consciousness Alert  Orientation Level Oriented X4  Respiratory  Respiratory Pattern Regular;Unlabored  Chest Assessment Chest expansion symmetrical  Bilateral Breath Sounds Clear  Cardiac  Pulse Irregular  Heart Sounds S1, S2  Jugular Venous Distention (JVD) No  ECG Monitor Yes  Cardiac Rhythm SB  Antiarrhythmic device No  Vascular  R Radial Pulse +2  L Radial Pulse +2  Psychosocial  Psychosocial (WDL) WDL

## 2019-07-14 NOTE — Progress Notes (Signed)
Post HD Note:    07/14/19 1430  Vital Signs  Temp 98.2 F (36.8 C)  Temp Source Oral  Pulse Rate 76  Pulse Rate Source Monitor  Resp 12  BP (!) 142/71  BP Location Right Arm  BP Method Automatic  Oxygen Therapy  SpO2 96 %  O2 Device Room Air  Pain Assessment  Pain Scale 0-10  Pain Score 0  Post-Hemodialysis Assessment  Rinseback Volume (mL) 250 mL  KECN 77.6 V  Dialyzer Clearance Lightly streaked  Duration of HD Treatment -hour(s) 3.5 hour(s)  Hemodialysis Intake (mL) 500 mL  UF Total -Machine (mL) 2007 mL  Net UF (mL) 1507 mL  Tolerated HD Treatment Yes  AVG/AVF Arterial Site Held (minutes)  (6 min)  AVG/AVF Venous Site Held (minutes)  (6 min)

## 2019-07-14 NOTE — Progress Notes (Signed)
PT Cancellation Note  Patient Details Name: Aydn Ferrara MRN: [id number] DOB: 09-Nov-1946   Cancelled Treatment:    Reason Eval/Treat Not Completed: Patient at procedure or test/unavailable(Consult received and chart reviewed. Patient currently off unit for dialysis.  Will re-attempt at later time/date as medically appropriate and available.)   Boden Stucky H. Owens Shark, PT, DPT, NCS 07/14/19, 11:09 AM [phone]

## 2019-07-14 NOTE — TOC Transition Note (Signed)
Transition of Care [facility]) - CM/SW Discharge Note   Patient Details  Name: Brian Fleming MRN: [id number] Date of Birth: 10-Oct-1946  Transition of Care [facility]) CM/SW Contact:  Davena Julian, Lenice Llamas Phone Number: [phone]  07/14/2019, 3:09 PM   Clinical Narrative: Clinical Social Worker (CSW) notified [place] agency representative that patient will D/C today. CSW made patient aware of above and provided him with an Eliquis coupon. CSW notified [facility]ill Bamberg dialysis coordinator that patient will D/C today. CSW left patient's son Roderic Palau a Advertising account executive. RN aware of above. Please reconsult if future social work needs arise. CSW signing off.      Final next level of care: Home w Home Health Services Barriers to Discharge: Barriers Resolved   Patient Goals and CMS Choice   CMS Medicare.gov Compare Post Acute Care list provided to:: Patient Choice offered to / list presented to : Patient, Adult Children  Discharge Placement                       Discharge Plan and Services   Discharge Planning Services: CM Consult Post Acute Care Choice: Home Health                    HH Arranged: PT, RN, Nurse's Aide [facility] Agency: [place] ([place]) Date HH Agency Contacted: 07/14/19 Time [place]: 1024 Representative spoke with at [place]: (S) [place] ([place]) Interventions     Readmission Risk Interventions Readmission Risk Prevention Plan 07/12/2019  Transportation Screening Complete  PCP or Specialist Appt within 5-7 Days Complete  Home Care Screening Complete  Medication Review (RN CM) Complete  Some recent data might be hidden

## 2019-07-14 NOTE — Progress Notes (Signed)
HD Tx Initiation:   07/14/19 1031  Vital Signs  Temp 99 F (37.2 C)  Temp Source Oral  Pulse Rate (!) 56  Resp 17  BP 134/70  BP Location Right Arm  BP Method Automatic  Patient Position (if appropriate) Lying  Oxygen Therapy  SpO2 96 %  O2 Device Room Air  Pain Assessment  Pain Scale 0-10  Pain Score 0  During Hemodialysis Assessment  Blood Flow Rate (mL/min) 400 mL/min  Arterial Pressure (mmHg) -170 mmHg  Venous Pressure (mmHg) 200 mmHg  Transmembrane Pressure (mmHg) 50 mmHg  Ultrafiltration Rate (mL/min) 570 mL/min  Dialysate Flow Rate (mL/min) 800 ml/min  Conductivity: Machine  14  HD Safety Checks Performed Yes  Intra-Hemodialysis Comments Tx initiated

## 2019-07-14 NOTE — Care Management Important Message (Addendum)
Important Message  Patient Details  Name: Bassam Dresch MRN: [id number] Date of Birth: [birth year]   Medicare Important Message Given:  Yes  Copy left in patient's room.  Left VM for Hollace Kinnier, son, to review Medicare IM.  Encouraged callback.    Update 11:59am - received return call from son and informed of right.  Requested copy be emailed to [email or web address]. Copy of Medicare IM sent securely to email provided.    Dannette Barbara 07/14/2019, 11:10 AM

## 2019-07-14 NOTE — Discharge Summary (Signed)
[facility] [place] NAME: Brian Fleming    MR#:  [id number]  DATE OF BIRTH:  [birth year]  DATE OF ADMISSION:  07/11/2019 ADMITTING PHYSICIAN: Christel Mormon, MD  DATE OF DISCHARGE: 07/14/2019   PRIMARY CARE PHYSICIAN: System, Pcp Not In    ADMISSION DIAGNOSIS:  Trauma [T14.90XA] Elevated troponin [R79.89] Elevated CK [R74.8] Fall, initial encounter [W19.XXXA] Acromioclavicular joint separation, right, initial encounter [Q67.619J] Shoulder fracture, right, closed, initial encounter [S42.91XA] Midline back pain, unspecified back location, unspecified chronicity [M54.89] Congestive heart failure, unspecified HF chronicity, unspecified heart failure type ([place]) [I50.9]  DISCHARGE DIAGNOSIS:  Active Problems:   Fall   A fib  SECONDARY DIAGNOSIS:   Past Medical History:  Diagnosis Date  . Atrial flutter ([place])    a. dates back to at least 2013; b. CHADS2VASc 5 (HTN, age x 1, stroke x 2, vascular disease); c. started on Eliquis 06/2019  . DJD (degenerative joint disease)   . End stage renal disease on dialysis ([place])   . Hyperlipidemia   . Hypertension     HOSPITAL COURSE:   1. Fall. This could have been a mechanical fall or accidental mild rolling during his sleep. He has no subsequent injuries.  - physical therapy consultation to assist his ambulation since he has been on the floor since Friday.suggested SNF placement initially but changed to home health with supervision later. - His head CT scan without contrast came back negative. -follow neuro checks every 4 hours for 24 hours- stable.  2. Elevated troponin I.  - follow serial troponin levels and obtain a 2D echo and a cardiology consult. -Demand ischemia- no further work ups.  3. End-stage renal disease on hemodialysis. -Nephrology consultation  -This could be contributing to fluid overload that gave rise to his elevated BNP. -He could be havingmild  acute on chronic CHF, possibly diastolic.   Given IV Lasix but as patient is at hemodialysis we will stop Lasix now. - he does not have significant clinical CHF symptoms.  4. Back pain. I believe that this is chronic. The finding on the CT was suspicious for possible T9-T 10 discitis however MRI ruled it out as mentioned above. Pain management will be provided.  5. Hypertension. We will continue Altace and amlodipine.  6. Dyslipidemia.Continue statin therapy.  7. DVT prophylaxis. Subtenons heparin.  8. A fib- per cardio started on eliquis, Rate is controlled. Check magnesium and TSH and they are normal. Cardiologist has discussed his EKG and findings with EP physician and no new changes suggested   DISCHARGE CONDITIONS:   Stable.  CONSULTS OBTAINED:    DRUG ALLERGIES:  No Known Allergies  DISCHARGE MEDICATIONS:   Allergies as of 07/14/2019   No Known Allergies     Medication List    STOP taking these medications   cefdinir 300 MG capsule Commonly known as: OMNICEF   etodolac 200 MG capsule Commonly known as: LODINE   predniSONE 10 MG (21) Tbpk tablet Commonly known as: STERAPRED UNI-PAK 21 TAB     TAKE these medications   amLODipine 10 MG tablet Commonly known as: NORVASC Take 10 mg by mouth at bedtime.   apixaban 5 MG Tabs tablet Commonly known as: ELIQUIS Take 1 tablet (5 mg total) by mouth 2 (two) times daily.   aspirin EC 81 MG tablet Take 1 tablet (81 mg total) by mouth daily.   atorvastatin 40 MG tablet Commonly known as: LIPITOR Take 1 tablet (40 mg total)  by mouth daily at 6 PM.   calcium carbonate 500 MG chewable tablet Commonly known as: TUMS - dosed in mg elemental calcium Chew 2 tablets (400 mg of elemental calcium total) by mouth 3 (three) times daily.   gabapentin 300 MG capsule Commonly known as: NEURONTIN Take 300 mg by mouth daily.   hydrALAZINE 25 MG tablet Commonly known as: APRESOLINE Take 1 tablet (25 mg  total) by mouth 3 (three) times daily.   ramipril 10 MG capsule Commonly known as: ALTACE Take 10 mg by mouth daily.   ranitidine 150 MG capsule Commonly known as: ZANTAC Take 150 mg by mouth 2 (two) times daily.   RENA-VITE PO Take 1 tablet by mouth daily.   sevelamer carbonate 800 MG tablet Commonly known as: RENVELA Take 2,400 mg by mouth 3 (three) times daily.   traMADol 50 MG tablet Commonly known as: ULTRAM Take 2 tablets (100 mg total) by mouth 2 (two) times daily. What changed: when to take this        DISCHARGE INSTRUCTIONS:    Follow with PMD in 1-2 weeks.  If you experience worsening of your admission symptoms, develop shortness of breath, life threatening emergency, suicidal or homicidal thoughts you must seek medical attention immediately by calling 911 or calling your MD immediately  if symptoms less severe.  You Must read complete instructions/literature along with all the possible adverse reactions/side effects for all the Medicines you take and that have been prescribed to you. Take any new Medicines after you have completely understood and accept all the possible adverse reactions/side effects.   Please note  You were cared for by a hospitalist during your hospital stay. If you have any questions about your discharge medications or the care you received while you were in the hospital after you are discharged, you can call the unit and asked to speak with the hospitalist on call if the hospitalist that took care of you is not available. Once you are discharged, your primary care physician will handle any further medical issues. Please note that NO REFILLS for any discharge medications will be authorized once you are discharged, as it is imperative that you return to your primary care physician (or establish a relationship with a primary care physician if you do not have one) for your aftercare needs so that they can reassess your need for medications and monitor  your lab values.    Today   CHIEF COMPLAINT:   Chief Complaint  Patient presents with  . Fall    HISTORY OF PRESENT ILLNESS:  Brian Fleming  is a 73 y.o. male with a known history of  end-stage renal disease on hemodialysis on Monday Wednesday and Friday, CHF, dyslipidemia, hypertension and DJD, who presented to the emergency room with acute onset of fall from his bed.  The patient stated that he went to sleep and was dreaming and the next thing he found himself on the floor.  He denied presyncope or syncope since he was asleep.  He admitted to mild dizziness and headache after falling.  He denied any paresthesias or focal muscle weakness.  He stated on the floor from Friday until her son came to visit him on Sunday.  When he did not respond apparently son called EMS.  Patient admitted to vomiting once and denied any dyspnea or cough or wheezing or chest pain.  He has been having mid back pain since he had a surgery in his back in 1990 per his report has  not significantly increased lately but it has been tender to touch.  No fever or chills.  No worsening lower extremity edema, orthopnea or paroxysmal nocturnal dyspnea or palpitations.  No abdominal pain or melena or bright red bleeding per rectum.  Upon presentation to the emergency room, his blood pressure was elevated 190/110 2 pulse of 113 respiratory to 21.  Blood pressure was later on down to 162/85 and later 136/90 with a temperature of 97.4.  Mild hypokalemia and elevated BUN and creatinine at 39/9.98 with calcium of 8 and a gap of 18 and CO2 27.  BNP was 736 and CK was 661 with a high-sensitivity troponin I of 54 and later 55.  Lactic acid was 1.7 CBC showed anemia slightly better than previous levels in February.  COVID-19 test came back negative.  Abdominal pelvic CT scan revealed haziness/edema in the retroperitoneum could be related to retroperitoneal fibrosis possibly in the early acute inflammatory stage and it showed loss of  cortical margins in the endplates adjacent to the T9- T10 disc, possibly discitis or osteomyelitis with recommendation for MRI.  It showed diverticulosis without diverticulitis, cholelithiasis without cholecystitis and small polyps in the gallbladder with no biliary dilatation.  It showed small hiatal hernia and renal osteodystrophy as well as moderate prostate enlargement and aortic atherosclerosis.Marland Kitchen  Head CT scan without contrast showed chronic microvascular ischemic changes and small old infarcts and encephalomalacia in cerebellar hemispheres with no acute intracranial hemorrhage and C-spine CT showed no acute/traumatic C-spine pathology with extensive degenerative changes.  MRI of the TL spine revealed: 1. 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 Infection is considered unlikely given the minimal change since 01/25/2019. 2. 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  The patient was given 4 mg IV Zofran in the emergency room.  He will be admitted to a medical monitored bed for further evaluation and management.   VITAL SIGNS:  Blood pressure 131/75, pulse 76, temperature 99 F (37.2 C), temperature source Oral, resp. rate 12, height 5\' 11"  (1.803 m), weight 85.9 kg, SpO2 97 %.  I/O:    Intake/Output Summary (Last 24 hours) at 07/14/2019 1430 Last data filed at 07/14/2019 5053 Gross per 24 hour  Intake 598 ml  Output 0 ml  Net 598 ml    PHYSICAL EXAMINATION:   GENERAL:  73 y.o.-year-old patient lying in the bed with no acute distress.  EYES: Pupils equal, round, reactive to light and accommodation. No scleral icterus. Extraocular muscles intact.  HEENT: Head atraumatic, normocephalic. Oropharynx and nasopharynx clear.  NECK:  Supple, no jugular venous distention. No thyroid enlargement, no tenderness.  LUNGS: Normal  breath sounds bilaterally, no wheezing, rales,rhonchi or crepitation. No use of accessory muscles of respiration.  CARDIOVASCULAR: S1, S2 normal. No murmurs, rubs, or gallops.  ABDOMEN: Soft, nontender, nondistended. Bowel sounds present. No organomegaly or mass.  EXTREMITIES: No pedal edema, cyanosis, or clubbing.  NEUROLOGIC: Cranial nerves II through XII are intact. Muscle strength 3-4/5 in all extremities. Sensation intact. Gait not checked.  PSYCHIATRIC: The patient is alert and oriented x 2-3.  SKIN: No obvious rash, lesion, or ulcer.   DATA REVIEW:   CBC Recent Labs  Lab 07/12/19 0223  WBC 4.3  HGB 10.6*  HCT 32.7*  PLT 147*    Chemistries  Recent Labs  Lab 07/11/19 1711 07/12/19 0223 07/13/19 0419  NA 138 139  --  K 4.5 4.4  --   CL 93* 96*  --   CO2 27 28  --   GLUCOSE 75 129*  --   BUN 39* 46*  --   CREATININE 9.98* 10.92*  --   CALCIUM 8.0* 7.7*  --   MG  --   --  2.0  AST 19  --   --   ALT 9  --   --   ALKPHOS 75  --   --   BILITOT 1.0  --   --     Cardiac Enzymes No results for input(s): TROPONINI in the last 168 hours.  Microbiology Results  Results for orders placed or performed during the hospital encounter of 07/11/19  SARS Coronavirus 2 (CEPHEID - Performed in [facility] hospital lab), Hosp Order     Status: None   Collection Time: 07/11/19 10:00 PM   Specimen: Nasopharyngeal Swab  Result Value Ref Range Status   SARS Coronavirus 2 NEGATIVE NEGATIVE Final    Comment: (NOTE) If result is NEGATIVE SARS-CoV-2 target nucleic acids are NOT DETECTED. The SARS-CoV-2 RNA is generally detectable in upper and lower  respiratory specimens during the acute phase of infection. The lowest  concentration of SARS-CoV-2 viral copies this assay can detect is 250  copies / mL. A negative result does not preclude SARS-CoV-2 infection  and should not be used as the sole basis for treatment or other  patient management decisions.  A negative result may occur  with  improper specimen collection / handling, submission of specimen other  than nasopharyngeal swab, presence of viral mutation(s) within the  areas targeted by this assay, and inadequate number of viral copies  (<250 copies / mL). A negative result must be combined with clinical  observations, patient history, and epidemiological information. If result is POSITIVE SARS-CoV-2 target nucleic acids are DETECTED. The SARS-CoV-2 RNA is generally detectable in upper and lower  respiratory specimens dur ing the acute phase of infection.  Positive  results are indicative of active infection with SARS-CoV-2.  Clinical  correlation with patient history and other diagnostic information is  necessary to determine patient infection status.  Positive results do  not rule out bacterial infection or co-infection with other viruses. If result is PRESUMPTIVE POSTIVE SARS-CoV-2 nucleic acids MAY BE PRESENT.   A presumptive positive result was obtained on the submitted specimen  and confirmed on repeat testing.  While 2019 novel coronavirus  (SARS-CoV-2) nucleic acids may be present in the submitted sample  additional confirmatory testing may be necessary for epidemiological  and / or clinical management purposes  to differentiate between  SARS-CoV-2 and other Sarbecovirus currently known to infect humans.  If clinically indicated additional testing with an alternate test  methodology [phone]) is advised. The SARS-CoV-2 RNA is generally  detectable in upper and lower respiratory sp ecimens during the acute  phase of infection. The expected result is Negative. Fact Sheet for Patients:  StrictlyIdeas.no Fact Sheet for Healthcare Providers: BankingDealers.co.za This test is not yet approved or cleared by the [place] FDA and has been authorized for detection and/or diagnosis of SARS-CoV-2 by FDA under an Emergency Use Authorization (EUA).  This EUA  will remain in effect (meaning this test can be used) for the duration of the COVID-19 declaration under Section 564(b)(1) of the Act, 21 U.S.C. section 360bbb-3(b)(1), unless the authorization is terminated or revoked sooner. Performed at [facility], [address] [place], [place] [address]   MRSA PCR Screening  Status: None   Collection Time: 07/12/19  1:53 AM   Specimen: Nasal Mucosa; Nasopharyngeal  Result Value Ref Range Status   MRSA by PCR NEGATIVE NEGATIVE Final    Comment:        The GeneXpert MRSA Assay (FDA approved for NASAL specimens only), is one component of a comprehensive MRSA colonization surveillance program. It is not intended to diagnose MRSA infection nor to guide or monitor treatment for MRSA infections. Performed at [facility], [address] [place], [place] [address]     RADIOLOGY:  No results found.  EKG:   Orders placed or performed during the hospital encounter of 07/11/19  . ED EKG  . ED EKG  . EKG 12-Lead  . EKG 12-Lead  . EKG 12-Lead  . EKG 12-Lead      Management plans discussed with the patient, family and they are in agreement.  CODE STATUS:  Full.    Code Status Orders  (From admission, onward)         Start     Ordered   07/11/19 2340  Full code  Continuous     07/11/19 2346        Code Status History    Date Active Date Inactive Code Status Order ID Comments User Context   01/25/2019 1657 01/27/2019 1947 Full Code [id number]  Hillary Bow, MD ED   04/11/2016 2032 04/12/2016 1940 Full Code [id number]  Vaughan Basta, MD Inpatient   08/20/2015 1553 08/22/2015 1621 Full Code [id number]  Henreitta Leber, MD Inpatient   Advance Care Planning Activity      TOTAL TIME TAKING CARE OF THIS PATIENT: 35 minutes.    Vaughan Basta M.D on 07/14/2019 at 2:30 PM  Between 7am to 6pm - Pager - [phone]  After 6pm go to [email or web address] - password [email or web address]  Office  [phone]  CC: Primary care physician; System, Pcp Not In   Note: This dictation was prepared with Dragon dictation along with smaller phrase technology. Any transcriptional errors that result from this process are unintentional.

## 2019-07-14 NOTE — Progress Notes (Signed)
HD Tx Completion:    07/14/19 1410  Vital Signs  Pulse Rate 76  Resp 12  BP 131/75  Oxygen Therapy  SpO2 97 %  O2 Device Room Air  Pain Assessment  Pain Scale 0-10  Pain Score 0  During Hemodialysis Assessment  Blood Flow Rate (mL/min) 400 mL/min  Arterial Pressure (mmHg) -180 mmHg  Venous Pressure (mmHg) 170 mmHg  Transmembrane Pressure (mmHg) 70 mmHg  Ultrafiltration Rate (mL/min) 570 mL/min  Dialysate Flow Rate (mL/min) 800 ml/min  Conductivity: Machine  14  HD Safety Checks Performed Yes  Intra-Hemodialysis Comments Tx completed

## 2019-07-14 NOTE — Progress Notes (Signed)
[facility]  ROUNDING NOTE   Subjective:  Patient seen and evaluated during hemodialysis. Tolerating well.    Objective:  Vital signs in last 24 hours:  Temp:  [97.9 F (36.6 C)-99 F (37.2 C)] 99 F (37.2 C) (07/22 1031) Pulse Rate:  [56-58] 56 (07/22 1115) Resp:  [12-18] 12 (07/22 1115) BP: (115-134)/(60-73) 120/65 (07/22 1115) SpO2:  [92 %-99 %] 95 % (07/22 1115)  Weight change:  Filed Weights   07/12/19 0109 07/12/19 1723  Weight: 84.7 kg 83 kg    Intake/Output: I/O last 3 completed shifts: In: 838 [P.O.:838] Out: 0    Intake/Output this shift:  No intake/output data recorded.  Physical Exam: General: No acute distress  Head: Normocephalic, atraumatic. Moist oral mucosal membranes  Eyes: Anicteric  Neck: Supple, trachea midline  Lungs:  Clear to auscultation, normal effort  Heart: S1S2 no rubs  Abdomen:  Soft, nontender, bowel sounds present  Extremities: Trace peripheral edema.  Neurologic: Awake, alert, following commands  Skin: No lesions  Access: LUE AVF    Basic Metabolic Panel: Recent Labs  Lab 07/11/19 1711 07/12/19 0223 07/13/19 0419  NA 138 139  --   K 4.5 4.4  --   CL 93* 96*  --   CO2 27 28  --   GLUCOSE 75 129*  --   BUN 39* 46*  --   CREATININE 9.98* 10.92*  --   CALCIUM 8.0* 7.7*  --   MG  --   --  2.0  PHOS  --  7.0*  --     Liver Function Tests: Recent Labs  Lab 07/11/19 1711 07/12/19 0223  AST 19  --   ALT 9  --   ALKPHOS 75  --   BILITOT 1.0  --   PROT 7.7  --   ALBUMIN 3.7 3.5   Recent Labs  Lab 07/11/19 1711  LIPASE 33   No results for input(s): AMMONIA in the last 168 hours.  CBC: Recent Labs  Lab 07/11/19 1711 07/12/19 0223  WBC 4.3 4.3  NEUTROABS 2.7  --   HGB 11.9* 10.6*  HCT 36.4* 32.7*  MCV 93.6 94.8  PLT 168 147*    Cardiac Enzymes: Recent Labs  Lab 07/11/19 1957  CKTOTAL 661*    BNP: Invalid input(s): POCBNP  CBG: No results for input(s): GLUCAP in the last 168  hours.  Microbiology: Results for orders placed or performed during the hospital encounter of 07/11/19  SARS Coronavirus 2 (CEPHEID - Performed in [place] hospital lab), Hosp Order     Status: None   Collection Time: 07/11/19 10:00 PM   Specimen: Nasopharyngeal Swab  Result Value Ref Range Status   SARS Coronavirus 2 NEGATIVE NEGATIVE Final    Comment: (NOTE) If result is NEGATIVE SARS-CoV-2 target nucleic acids are NOT DETECTED. The SARS-CoV-2 RNA is generally detectable in upper and lower  respiratory specimens during the acute phase of infection. The lowest  concentration of SARS-CoV-2 viral copies this assay can detect is 250  copies / mL. A negative result does not preclude SARS-CoV-2 infection  and should not be used as the sole basis for treatment or other  patient management decisions.  A negative result may occur with  improper specimen collection / handling, submission of specimen other  than nasopharyngeal swab, presence of viral mutation(s) within the  areas targeted by this assay, and inadequate number of viral copies  (<250 copies / mL). A negative result must be combined with clinical  observations, patient history, and epidemiological information. If result is POSITIVE SARS-CoV-2 target nucleic acids are DETECTED. The SARS-CoV-2 RNA is generally detectable in upper and lower  respiratory specimens dur ing the acute phase of infection.  Positive  results are indicative of active infection with SARS-CoV-2.  Clinical  correlation with patient history and other diagnostic information is  necessary to determine patient infection status.  Positive results do  not rule out bacterial infection or co-infection with other viruses. If result is PRESUMPTIVE POSTIVE SARS-CoV-2 nucleic acids MAY BE PRESENT.   A presumptive positive result was obtained on the submitted specimen  and confirmed on repeat testing.  While 2019 novel coronavirus  (SARS-CoV-2) nucleic acids may be  present in the submitted sample  additional confirmatory testing may be necessary for epidemiological  and / or clinical management purposes  to differentiate between  SARS-CoV-2 and other Sarbecovirus currently known to infect humans.  If clinically indicated additional testing with an alternate test  methodology [phone]) is advised. The SARS-CoV-2 RNA is generally  detectable in upper and lower respiratory sp ecimens during the acute  phase of infection. The expected result is Negative. Fact Sheet for Patients:  StrictlyIdeas.no Fact Sheet for Healthcare Providers: BankingDealers.co.za This test is not yet approved or cleared by the [place] FDA and has been authorized for detection and/or diagnosis of SARS-CoV-2 by FDA under an Emergency Use Authorization (EUA).  This EUA will remain in effect (meaning this test can be used) for the duration of the COVID-19 declaration under Section 564(b)(1) of the Act, 21 U.S.C. section 360bbb-3(b)(1), unless the authorization is terminated or revoked sooner. Performed at [facility], [address] [place], [place] [address]   MRSA PCR Screening     Status: None   Collection Time: 07/12/19  1:53 AM   Specimen: Nasal Mucosa; Nasopharyngeal  Result Value Ref Range Status   MRSA by PCR NEGATIVE NEGATIVE Final    Comment:        The GeneXpert MRSA Assay (FDA approved for NASAL specimens only), is one component of a comprehensive MRSA colonization surveillance program. It is not intended to diagnose MRSA infection nor to guide or monitor treatment for MRSA infections. Performed at [facility], [address] [place], [place] [address]     Coagulation Studies: Recent Labs    07/12/19 1808  LABPROT 15.2  INR 1.2    Urinalysis: No results for input(s): COLORURINE, LABSPEC, PHURINE, GLUCOSEU, HGBUR, BILIRUBINUR, KETONESUR, PROTEINUR, UROBILINOGEN, NITRITE,  LEUKOCYTESUR in the last 72 hours.  Invalid input(s): APPERANCEUR    Imaging: No results found.   Medications:    . amLODipine  10 mg Oral QHS  . apixaban  5 mg Oral BID  . atorvastatin  40 mg Oral q1800  . calcium carbonate  400 mg of elemental calcium Oral TID  . Chlorhexidine Gluconate Cloth  6 each Topical Q0600  . famotidine  20 mg Oral Daily  . gabapentin  300 mg Oral Daily  . hydrALAZINE  25 mg Oral TID  . multivitamin  1 tablet Oral Daily  . ramipril  10 mg Oral Daily  . sevelamer carbonate  2,400 mg Oral TID WC  . traMADol  100 mg Oral Q12H   acetaminophen **OR** acetaminophen, magnesium hydroxide, ondansetron **OR** ondansetron (ZOFRAN) IV, traZODone  Assessment/ Plan:  73 y.o. male  with end-stage renal disease, hypertension, hyperlipidemia, degenerative disc disease, anemia chronic kidney disease, secondary hyperparathyroidism, admitted now for pneumonia.  [facility]/MWF  1.  ESRD on HD MWF.    Patient seen and evaluated during hemodialysis.  Tolerating well.  We plan to complete dialysis treatment today.  2.  Anemia of chronic kidney disease.  Hemoglobin 10.6.  Hold off on Epogen at the moment.  3.  Secondary hyperparathyroidism.  Repeat serum phosphorus today.  Otherwise maintain the patient on Renvela 2400 mg p.o. 3 times daily with meals..  4.  Hypertension.  Blood pressure well controlled at 120/65.  Maintain the patient on ramipril and hydralazine.  5.  Status post fall.  Patient will need both physical and occupational therapy post discharge.   LOS: 3 Ambera Fedele 7/22/202011:25 AM

## 2019-07-14 NOTE — Progress Notes (Signed)
Post-HD Assessment:    07/14/19 1410  Neurological  Level of Consciousness Alert  Orientation Level Oriented X4  Respiratory  Respiratory Pattern Regular;Unlabored  Chest Assessment Chest expansion symmetrical  Bilateral Breath Sounds Clear;Diminished  Cardiac  Pulse Regular  Heart Sounds S1, S2  Jugular Venous Distention (JVD) No  ECG Monitor Yes  Cardiac Rhythm SB  Vascular  R Radial Pulse +2  L Radial Pulse +2  Psychosocial  Psychosocial (WDL) WDL

## 2019-07-14 NOTE — Progress Notes (Addendum)
Attempted to call pts son , Jenny Reichmann, to make aware of pts discharge/ unsuccessful attempt /  Voice message left    16:22  Spoke with pts son, Jenny Reichmann, aware of discharge/ will discuss discharge instructions with Jenny Reichmann when he arrives to pick pt up/ stated he will be here around 6pm

## 2019-07-26 NOTE — Progress Notes (Deleted)
Cardiology Office Note    Date:  07/26/2019   ID:  Brian Fleming, DOB [birth year], MRN [id number]  PCP:  System, Pcp Not In  Cardiologist:  Nelva Bush, MD  Electrophysiologist:  None   Chief Complaint: Hospital follow-up  History of Present Illness:   Brian Fleming is a 73 y.o. male with history of atrial flutter dating back to 2013 previously not on [facility] though started on Eliquis after recent admission in 06/2019, ESRD on HD MWF, PAD, prior stroke, anemia of chronic disease, HTN, HLD, and DJD who presents for hospital follow-up after recent admission from 7/20 through 7/22 for falling out of the bed and being incidentally noted to be in atrial flutter.  Patient has a history of previously being seen by cardiology in the remote past and being told "his heart was fine," and was "given 3 pills."  Prior PET Myoview from 2015, as part of renal transplant evaluation read as probably normal with anterolateral defect felt to be artifact with an EF of 49%.  Echo at that time showed an EF of 45%, LVH, mitral annular calcification, mild MR, biatrial dilatation, and pericardial effusion.  Review of old EKGs in epic demonstrated the patient had been in atrial flutter versus atrial tachycardia since approximately 07/2015 with first mention of atrial flutter being 2013.  Patient was admitted to the hospital after falling out of the bed on 07/08/2028 when she was having a dream that he was back in [place] looking for dynamite.  Upon finding this, he jumped in his his dream and subsequently fell out of the bed.  He could not get up off the floor and went back to sleep.  He denied hitting his head on the floor.  He remained on the floor unable to get up until 07/11/2019 when his son arrived and called EMS for the patient to be brought to the hospital.  In the ED, head CT was nonacute with a small old infarcts noted.  Has a DVD troponin of 54 with a delta of 55.  CK was elevated at 61.  BNP 737.  EKG showed  atypical atrial flutter versus atrial tachycardia with 2-1 conduction and nonspecific ST-T changes.  Echo showed an EF of 55 to 60%, moderately reduced RV systolic function, moderately enlarged RV cavity, RV systolic function 51.7, mild to moderately dilated left atrium, mildly dilated right atrium, moderate mitral annular calcification, trileaflet aortic valve, normal size and structure aortic root.  Patient's EKG and telemetry reviewed by electrophysiology who felt like the patient was in atrial flutter.  Patient was started on Eliquis.  Outpatient stress testing was recommended.  ***  Labs: 06/2019 - TSH normal, magnesium 2.0, potassium 4.4, serum creatinine 10.92, albumin 3.7, AST/ALT normal, WBC 4.3, Hgb 10.6, PLT 147  Past Medical History:  Diagnosis Date   Atrial flutter ([place])    a. dates back to at least 2013; b. CHADS2VASc 5 (HTN, age x 1, stroke x 2, vascular disease); c. started on Eliquis 06/2019   DJD (degenerative joint disease)    End stage renal disease on dialysis ([place])    Hyperlipidemia    Hypertension     Past Surgical History:  Procedure Laterality Date   AV FISTULA PLACEMENT     BACK SURGERY      Current Medications: No outpatient medications have been marked as taking for the 07/29/19 encounter (Appointment) with Rise Mu, PA-C.    Allergies:   Patient has no known allergies.   Social History  Socioeconomic History   Marital status: Widowed    Spouse name: Not on file   Number of children: Not on file   Years of education: Not on file   Highest education level: Not on file  Occupational History   Not on file  Social Needs   Financial resource strain: Not on file   Food insecurity    Worry: Not on file    Inability: Not on file   Transportation needs    Medical: Not on file    Non-medical: Not on file  Tobacco Use   Smoking status: Current Every Day Smoker    Packs/day: 0.25    Types: Cigarettes   Smokeless tobacco: Never Used    Substance and Sexual Activity   Alcohol use: No   Drug use: No   Sexual activity: Not on file  Lifestyle   Physical activity    Days per week: Not on file    Minutes per session: Not on file   Stress: Not on file  Relationships   Social connections    Talks on phone: Not on file    Gets together: Not on file    Attends religious service: Not on file    Active member of club or organization: Not on file    Attends meetings of clubs or organizations: Not on file    Relationship status: Not on file  Other Topics Concern   Not on file  Social History Narrative   Not on file     Family History:  The patient's family history includes Hypertension in his brother.  ROS:   ROS   EKGs/Labs/Other Studies Reviewed:    Studies reviewed were summarized above. The additional studies were reviewed today:  2D Echo 06/2019: 1. The left ventricle has normal systolic function, with an ejection fraction of 55-60%. The cavity size was normal. There is mildly increased left ventricular wall thickness. Left ventricular diastolic function could not be evaluated.  2. The right ventricle has moderately reduced systolic function. The cavity was moderately enlarged. There is mildly increased right ventricular wall thickness. Right ventricular systolic pressure is mildly to moderately elevated with an estimated pressure of 43.9 mmHg.  3. Left atrial size was mild-moderately dilated.  4. Right atrial size was mildly dilated.  5. The mitral valve is degenerative. Mild thickening of the mitral valve leaflet. Mild calcification of the mitral valve leaflet. There is moderate mitral annular calcification present.  6. The aortic valve is tricuspid. Mild thickening of the aortic valve. Mild calcification of the aortic valve.  7. The aortic root is normal in size and structure.  8. The inferior vena cava was dilated in size with >50% respiratory variability.   EKG:  EKG is ordered today.  The EKG  ordered today demonstrates ***  Recent Labs: 07/11/2019: ALT 9 07/12/2019: BUN 46; Creatinine, Ser 10.92; Hemoglobin 10.6; Platelets 147; Potassium 4.4; Sodium 139 07/13/2019: Magnesium 2.0; TSH 1.662 07/14/2019: B Natriuretic Peptide 354.0  Recent Lipid Panel    Component Value Date/Time   CHOL 157 04/11/2016 1553   TRIG 105 04/11/2016 1553   HDL 26 (L) 04/11/2016 1553   CHOLHDL 6.0 04/11/2016 1553   VLDL 21 04/11/2016 1553   LDLCALC 110 (H) 04/11/2016 1553    PHYSICAL EXAM:    VS:  There were no vitals taken for this visit.  BMI: There is no height or weight on file to calculate BMI.  Physical Exam  Wt Readings from Last 3 Encounters:  07/14/19  189 lb 6 oz (85.9 kg)  01/27/19 196 lb 13.9 oz (89.3 kg)  02/22/17 210 lb (95.3 kg)     ASSESSMENT & PLAN:   1. ***  Disposition: F/u with Dr. Saunders Revel in ***.   Medication Adjustments/Labs and Tests Ordered: Current medicines are reviewed at length with the patient today.  Concerns regarding medicines are outlined above. Medication changes, Labs and Tests ordered today are summarized above and listed in the Patient Instructions accessible in Encounters.   Signed, Christell Faith, PA-C 07/26/2019 1:37 PM     [facility] [address] [place], [place] [address] [phone]

## 2019-07-29 ENCOUNTER — Ambulatory Visit: Payer: Medicare Other | Admitting: Physician Assistant

## 2020-01-26 ENCOUNTER — Emergency Department: Payer: Medicare Other

## 2020-01-26 ENCOUNTER — Other Ambulatory Visit: Payer: Self-pay

## 2020-01-26 ENCOUNTER — Emergency Department
Admission: EM | Admit: 2020-01-26 | Discharge: 2020-01-26 | Disposition: A | Discharge status: Home / Self Care | Payer: Medicare Other | Attending: Emergency Medicine | Admitting: Emergency Medicine

## 2020-01-26 ENCOUNTER — Encounter: Payer: Self-pay | Admitting: Emergency Medicine

## 2020-01-26 DIAGNOSIS — N186 End stage renal disease: Secondary | ICD-10-CM | POA: Insufficient documentation

## 2020-01-26 DIAGNOSIS — R609 Edema, unspecified: Secondary | ICD-10-CM

## 2020-01-26 DIAGNOSIS — R600 Localized edema: Secondary | ICD-10-CM | POA: Diagnosis not present

## 2020-01-26 DIAGNOSIS — Z7982 Long term (current) use of aspirin: Secondary | ICD-10-CM | POA: Insufficient documentation

## 2020-01-26 DIAGNOSIS — I509 Heart failure, unspecified: Secondary | ICD-10-CM | POA: Diagnosis not present

## 2020-01-26 DIAGNOSIS — F17210 Nicotine dependence, cigarettes, uncomplicated: Secondary | ICD-10-CM | POA: Diagnosis not present

## 2020-01-26 DIAGNOSIS — R2243 Localized swelling, mass and lump, lower limb, bilateral: Secondary | ICD-10-CM | POA: Diagnosis present

## 2020-01-26 DIAGNOSIS — Z7901 Long term (current) use of anticoagulants: Secondary | ICD-10-CM | POA: Diagnosis not present

## 2020-01-26 DIAGNOSIS — I132 Hypertensive heart and chronic kidney disease with heart failure and with stage 5 chronic kidney disease, or end stage renal disease: Secondary | ICD-10-CM | POA: Insufficient documentation

## 2020-01-26 DIAGNOSIS — Z992 Dependence on renal dialysis: Secondary | ICD-10-CM | POA: Insufficient documentation

## 2020-01-26 DIAGNOSIS — Z79899 Other long term (current) drug therapy: Secondary | ICD-10-CM | POA: Diagnosis not present

## 2020-01-26 LAB — BASIC METABOLIC PANEL
Anion gap: 16 — ABNORMAL HIGH (ref 5–15)
BUN: 42 mg/dL — ABNORMAL HIGH (ref 8–23)
CO2: 29 mmol/L (ref 22–32)
Calcium: 9.3 mg/dL (ref 8.9–10.3)
Chloride: 95 mmol/L — ABNORMAL LOW (ref 98–111)
Creatinine, Ser: 5.77 mg/dL — ABNORMAL HIGH (ref 0.61–1.24)
GFR calc Af Amer: 10 mL/min — ABNORMAL LOW (ref >60)
GFR calc non Af Amer: 9 mL/min — ABNORMAL LOW (ref >60)
Glucose, Bld: 66 mg/dL — ABNORMAL LOW (ref 70–99)
Potassium: 4.1 mmol/L (ref 3.5–5.1)
Sodium: 140 mmol/L (ref 135–145)

## 2020-01-26 LAB — CBC
HCT: 32.6 % — ABNORMAL LOW (ref 39.0–52.0)
Hemoglobin: 10.6 g/dL — ABNORMAL LOW (ref 13.0–17.0)
MCH: 30.5 pg (ref 26.0–34.0)
MCHC: 32.5 g/dL (ref 30.0–36.0)
MCV: 93.9 fL (ref 80.0–100.0)
Platelets: 169 10*3/uL (ref 150–400)
RBC: 3.47 MIL/uL — ABNORMAL LOW (ref 4.22–5.81)
RDW: 15 % (ref 11.5–15.5)
WBC: 5.3 10*3/uL (ref 4.0–10.5)
nRBC: 0 % (ref 0.0–0.2)

## 2020-01-26 NOTE — ED Provider Notes (Signed)
[facility] Emergency Department Provider Note   ____________________________________________    I have reviewed the triage vital signs and the nursing notes.   HISTORY  Chief Complaint Leg Pain     HPI Brian Fleming is a 74 y.o. male with a history of atrial flutter, end-stage renal disease on Eliquis who presents with complaints of bilateral lower extremity swelling/discomfort.  Patient complained of pain in his lower extremities bilaterally while at dialysis today.  Sent to the emergency department for evaluation.  Denies injury to the area.  Denies a history of DVT.  Has not take anything for this.  Cramping pain in both legs  Past Medical History:  Diagnosis Date  . Atrial flutter ([place])    a. dates back to at least 2013; b. CHADS2VASc 5 (HTN, age x 1, stroke x 2, vascular disease); c. started on Eliquis 06/2019  . DJD (degenerative joint disease)   . End stage renal disease on dialysis ([place])   . Hyperlipidemia   . Hypertension     Patient Active Problem List   Diagnosis Date Noted  . Fall 07/11/2019  . HCAP (healthcare-associated pneumonia) 01/26/2019  . Pneumonia 01/25/2019  . End stage renal disease ([place]) 12/04/2017  . Essential hypertension 12/04/2017  . Hyperlipidemia 12/04/2017  . Generalized weakness 04/11/2016  . CHF (congestive heart failure) ([place]) 08/20/2015    Past Surgical History:  Procedure Laterality Date  . AV FISTULA PLACEMENT    . BACK SURGERY      Prior to Admission medications   Medication Sig Start Date End Date Taking? Authorizing Provider  amLODipine (NORVASC) 10 MG tablet Take 10 mg by mouth at bedtime. 04/29/14   [provider]  apixaban (ELIQUIS) 5 MG TABS tablet Take 1 tablet (5 mg total) by mouth 2 (two) times daily. 07/14/19   Vaughan Basta, MD  aspirin EC 81 MG tablet Take 1 tablet (81 mg total) by mouth daily. Patient not taking: Reported on 07/12/2019 04/12/16   Demetrios Loll, MD    atorvastatin (LIPITOR) 40 MG tablet Take 1 tablet (40 mg total) by mouth daily at 6 PM. 04/12/16   Demetrios Loll, MD  B Complex-C-Folic Acid (RENA-VITE PO) Take 1 tablet by mouth daily. 10/27/14   [provider]  calcium carbonate (TUMS - DOSED IN MG ELEMENTAL CALCIUM) 500 MG chewable tablet Chew 2 tablets (400 mg of elemental calcium total) by mouth 3 (three) times daily. Patient not taking: Reported on 07/12/2019 08/22/15   Epifanio Lesches, MD  gabapentin (NEURONTIN) 300 MG capsule Take 300 mg by mouth daily.     [provider]  hydrALAZINE (APRESOLINE) 25 MG tablet Take 1 tablet (25 mg total) by mouth 3 (three) times daily. Patient not taking: Reported on 01/25/2019 08/22/15   Epifanio Lesches, MD  ramipril (ALTACE) 10 MG capsule Take 10 mg by mouth daily. 09/09/18   [provider]  ranitidine (ZANTAC) 150 MG capsule Take 150 mg by mouth 2 (two) times daily. 06/25/17   [provider]  sevelamer carbonate (RENVELA) 800 MG tablet Take 2,400 mg by mouth 3 (three) times daily. 06/08/18   [provider]  traMADol (ULTRAM) 50 MG tablet Take 2 tablets (100 mg total) by mouth 2 (two) times daily. 07/14/19   Vaughan Basta, MD     Allergies Patient has no known allergies.  Family History  Problem Relation Age of Onset  . Hypertension Brother     Social History Social History   Tobacco Use  .  Smoking status: Current Every Day Smoker    Packs/day: 0.25    Types: Cigarettes  . Smokeless tobacco: Never Used  Substance Use Topics  . Alcohol use: No  . Drug use: No    Review of Systems  Constitutional: No fever/chills Eyes: No visual changes.  ENT: No neck pain Cardiovascular: Denies chest pain. Respiratory: Denies shortness of breath. Gastrointestinal: No abdominal pain.  Genitourinary: Negative for dysuria. Musculoskeletal: Mild swelling of the legs bilaterally, chronic Skin: Negative for rash. Neurological: Negative for  headaches or weakness   ____________________________________________   PHYSICAL EXAM:  VITAL SIGNS: ED Triage Vitals  Enc Vitals Group     BP 01/26/20 1015 (!) 173/90     Pulse Rate 01/26/20 1015 73     Resp 01/26/20 1015 20     Temp 01/26/20 1015 (!) 97.5 F (36.4 C)     Temp Source 01/26/20 1015 Oral     SpO2 01/26/20 1015 100 %     Weight 01/26/20 1011 80.7 kg (178 lb)     Height 01/26/20 1011 1.727 m (5\' 8" )     Head Circumference --      Peak Flow --      Pain Score 01/26/20 1011 10     Pain Loc --      Pain Edu? --      Excl. in [place]? --     Constitutional: Alert and oriented.   Nose: No congestion/rhinnorhea. Mouth/Throat: Mucous membranes are moist.    Cardiovascular: Normal rate, regular rhythm. Grossly normal heart sounds.  Good peripheral circulation. Respiratory: Normal respiratory effort.  No retractions. Lungs CTAB. Gastrointestinal: Soft and nontender. No distention.  No CVA tenderness. Genitourinary: deferred Musculoskeletal: Warm and well perfused extremities, 2+ DP pulses bilaterally, positive edema bilaterally leading to smooth and shiny lower extremity.  No weeping, no infection or redness.  Warm and well perfused Neurologic:  Normal speech and language. No gross focal neurologic deficits are appreciated.  Skin:  Skin is warm, dry and intact Psychiatric: Mood and affect are normal. Speech and behavior are normal.  ____________________________________________   LABS (all labs ordered are listed, but only abnormal results are displayed)  Labs Reviewed  CBC - Abnormal; Notable for the following components:      Result Value   RBC 3.47 (*)    Hemoglobin 10.6 (*)    HCT 32.6 (*)    All other components within normal limits  BASIC METABOLIC PANEL - Abnormal; Notable for the following components:   Chloride 95 (*)    Glucose, Bld 66 (*)    BUN 42 (*)    Creatinine, Ser 5.77 (*)    GFR calc non Af Amer 9 (*)    GFR calc Af Amer 10 (*)    Anion  gap 16 (*)    All other components within normal limits   ____________________________________________  EKG   ____________________________________________  RADIOLOGY  Ultrasound negative for DVT in lower extremities Chest x-ray with cardiomegaly ____________________________________________   PROCEDURES  Procedure(s) performed: No  Procedures   Critical Care performed: No ____________________________________________   INITIAL IMPRESSION / ASSESSMENT AND PLAN / ED COURSE  Pertinent labs & imaging results that were available during my care of the patient were reviewed by me and considered in my medical decision making (see chart for details).  Patient presents with bilateral lower extremity discomfort, suspect this is related to edema given that the skin is relatively tense leading to smooth, very shiny appearance.  No weeping.  Warm and well perfused.  Ultrasound performed negative for DVT.  Patient improved with elevating the legs, recommend continued elevation at home, outpatient follow-up with his nephrology    ____________________________________________   FINAL CLINICAL IMPRESSION(S) / ED DIAGNOSES  Final diagnoses:  Peripheral edema        Note:  This document was prepared using Dragon voice recognition software and may include unintentional dictation errors.   Lavonia Drafts, MD 01/26/20 [phone]

## 2020-01-26 NOTE — ED Triage Notes (Signed)
Pt via EMS from Dialysis. Pt c/o bilateral leg pain in the calf area. Per EMS, pt finished his treatment today but missed Monday's treatment. Pt has no other complaints at this time.

## 2020-02-07 ENCOUNTER — Observation Stay
Admission: EM | Admit: 2020-02-07 | Discharge: 2020-02-08 | Disposition: A | Discharge status: Home / Self Care | Payer: Medicare Other | Attending: Family Medicine | Admitting: Family Medicine

## 2020-02-07 ENCOUNTER — Emergency Department: Payer: Medicare Other

## 2020-02-07 ENCOUNTER — Other Ambulatory Visit: Payer: Self-pay

## 2020-02-07 DIAGNOSIS — M545 Low back pain, unspecified: Secondary | ICD-10-CM

## 2020-02-07 DIAGNOSIS — Z79899 Other long term (current) drug therapy: Secondary | ICD-10-CM | POA: Diagnosis not present

## 2020-02-07 DIAGNOSIS — I10 Essential (primary) hypertension: Secondary | ICD-10-CM

## 2020-02-07 DIAGNOSIS — G8929 Other chronic pain: Secondary | ICD-10-CM | POA: Diagnosis not present

## 2020-02-07 DIAGNOSIS — Z20822 Contact with and (suspected) exposure to covid-19: Secondary | ICD-10-CM | POA: Diagnosis not present

## 2020-02-07 DIAGNOSIS — K219 Gastro-esophageal reflux disease without esophagitis: Secondary | ICD-10-CM

## 2020-02-07 DIAGNOSIS — N186 End stage renal disease: Secondary | ICD-10-CM

## 2020-02-07 DIAGNOSIS — I5032 Chronic diastolic (congestive) heart failure: Secondary | ICD-10-CM | POA: Insufficient documentation

## 2020-02-07 DIAGNOSIS — Z7901 Long term (current) use of anticoagulants: Secondary | ICD-10-CM | POA: Insufficient documentation

## 2020-02-07 DIAGNOSIS — F17210 Nicotine dependence, cigarettes, uncomplicated: Secondary | ICD-10-CM | POA: Insufficient documentation

## 2020-02-07 DIAGNOSIS — M549 Dorsalgia, unspecified: Secondary | ICD-10-CM | POA: Diagnosis present

## 2020-02-07 DIAGNOSIS — Z992 Dependence on renal dialysis: Secondary | ICD-10-CM

## 2020-02-07 DIAGNOSIS — I483 Typical atrial flutter: Secondary | ICD-10-CM | POA: Diagnosis not present

## 2020-02-07 DIAGNOSIS — I1311 Hypertensive heart and chronic kidney disease without heart failure, with stage 5 chronic kidney disease, or end stage renal disease: Secondary | ICD-10-CM | POA: Insufficient documentation

## 2020-02-07 DIAGNOSIS — N2581 Secondary hyperparathyroidism of renal origin: Secondary | ICD-10-CM | POA: Insufficient documentation

## 2020-02-07 DIAGNOSIS — D631 Anemia in chronic kidney disease: Secondary | ICD-10-CM | POA: Insufficient documentation

## 2020-02-07 DIAGNOSIS — Z720 Tobacco use: Secondary | ICD-10-CM

## 2020-02-07 DIAGNOSIS — E785 Hyperlipidemia, unspecified: Secondary | ICD-10-CM

## 2020-02-07 DIAGNOSIS — E875 Hyperkalemia: Principal | ICD-10-CM

## 2020-02-07 DIAGNOSIS — I4892 Unspecified atrial flutter: Secondary | ICD-10-CM | POA: Diagnosis not present

## 2020-02-07 LAB — RESPIRATORY PANEL BY RT PCR (FLU A&B, COVID)
Influenza A by PCR: NEGATIVE
Influenza B by PCR: NEGATIVE
SARS Coronavirus 2 by RT PCR: NEGATIVE

## 2020-02-07 LAB — CBC WITH DIFFERENTIAL/PLATELET
Abs Immature Granulocytes: 0.02 10*3/uL (ref 0.00–0.07)
Basophils Absolute: 0 10*3/uL (ref 0.0–0.1)
Basophils Relative: 1 %
Eosinophils Absolute: 0.3 10*3/uL (ref 0.0–0.5)
Eosinophils Relative: 5 %
HCT: 33.5 % — ABNORMAL LOW (ref 39.0–52.0)
Hemoglobin: 10.6 g/dL — ABNORMAL LOW (ref 13.0–17.0)
Immature Granulocytes: 0 %
Lymphocytes Relative: 16 %
Lymphs Abs: 0.9 10*3/uL (ref 0.7–4.0)
MCH: 30.8 pg (ref 26.0–34.0)
MCHC: 31.6 g/dL (ref 30.0–36.0)
MCV: 97.4 fL (ref 80.0–100.0)
Monocytes Absolute: 0.7 10*3/uL (ref 0.1–1.0)
Monocytes Relative: 11 %
Neutro Abs: 3.9 10*3/uL (ref 1.7–7.7)
Neutrophils Relative %: 67 %
Platelets: 148 10*3/uL — ABNORMAL LOW (ref 150–400)
RBC: 3.44 MIL/uL — ABNORMAL LOW (ref 4.22–5.81)
RDW: 15.1 % (ref 11.5–15.5)
WBC: 5.8 10*3/uL (ref 4.0–10.5)
nRBC: 0 % (ref 0.0–0.2)

## 2020-02-07 LAB — BASIC METABOLIC PANEL
Anion gap: 19 — ABNORMAL HIGH (ref 5–15)
BUN: 94 mg/dL — ABNORMAL HIGH (ref 8–23)
CO2: 24 mmol/L (ref 22–32)
Calcium: 9.6 mg/dL (ref 8.9–10.3)
Chloride: 99 mmol/L (ref 98–111)
Creatinine, Ser: 13.4 mg/dL — ABNORMAL HIGH (ref 0.61–1.24)
GFR calc Af Amer: 4 mL/min — ABNORMAL LOW (ref >60)
GFR calc non Af Amer: 3 mL/min — ABNORMAL LOW (ref >60)
Glucose, Bld: 83 mg/dL (ref 70–99)
Potassium: >7.5 mmol/L (ref 3.5–5.1)
Sodium: 142 mmol/L (ref 135–145)

## 2020-02-07 LAB — GLUCOSE, CAPILLARY
Glucose-Capillary: 73 mg/dL (ref 70–99)
Glucose-Capillary: 50 mg/dL — ABNORMAL LOW (ref 70–99)
Glucose-Capillary: 52 mg/dL — ABNORMAL LOW (ref 70–99)
Glucose-Capillary: 54 mg/dL — ABNORMAL LOW (ref 70–99)
Glucose-Capillary: 55 mg/dL — ABNORMAL LOW (ref 70–99)
Glucose-Capillary: 68 mg/dL — ABNORMAL LOW (ref 70–99)
Glucose-Capillary: 70 mg/dL (ref 70–99)

## 2020-02-07 MED ORDER — INSULIN ASPART 100 UNIT/ML ~~LOC~~ SOLN
6.0000 [IU] | Freq: Once | SUBCUTANEOUS | Status: AC
  Administered 2020-02-07: 16:00:00 6 [IU] via INTRAVENOUS
  Filled 2020-02-07: qty 1

## 2020-02-07 MED ORDER — METHOCARBAMOL 500 MG PO TABS
500.0000 mg | ORAL_TABLET | Freq: Three times a day (TID) | ORAL | Status: DC | PRN
  Filled 2020-02-07: qty 1

## 2020-02-07 MED ORDER — DEXTROSE 50 % IV SOLN
INTRAVENOUS | Status: AC
  Filled 2020-02-07: qty 50

## 2020-02-07 MED ORDER — HEPARIN SODIUM (PORCINE) 1000 UNIT/ML DIALYSIS
1000.0000 [IU] | INTRAMUSCULAR | Status: DC | PRN
  Filled 2020-02-07: qty 1

## 2020-02-07 MED ORDER — AMLODIPINE BESYLATE 10 MG PO TABS: 10.0000 mg | ORAL_TABLET | Freq: Every day | ORAL | Status: DC

## 2020-02-07 MED ORDER — FENTANYL CITRATE (PF) 100 MCG/2ML IJ SOLN
50.0000 ug | INTRAMUSCULAR | Status: DC | PRN
  Filled 2020-02-07: qty 2

## 2020-02-07 MED ORDER — HYDROCODONE-ACETAMINOPHEN 5-325 MG PO TABS
1.0000 | ORAL_TABLET | Freq: Once | ORAL | Status: AC
  Administered 2020-02-07: 1 via ORAL
  Filled 2020-02-07: qty 1

## 2020-02-07 MED ORDER — CALCIUM GLUCONATE 10 % IV SOLN
1.0000 g | Freq: Once | INTRAVENOUS | Status: AC
  Administered 2020-02-07: 16:00:00 1 g via INTRAVENOUS
  Filled 2020-02-07: qty 10

## 2020-02-07 MED ORDER — HYDRALAZINE HCL 20 MG/ML IJ SOLN
5.0000 mg | INTRAMUSCULAR | Status: DC | PRN
  Filled 2020-02-07: qty 1

## 2020-02-07 MED ORDER — SODIUM CHLORIDE 0.9 % IV SOLN: 100.0000 mL | INTRAVENOUS | Status: DC | PRN

## 2020-02-07 MED ORDER — TRAMADOL HCL 50 MG PO TABS
100.0000 mg | ORAL_TABLET | Freq: Two times a day (BID) | ORAL | Status: DC
  Administered 2020-02-08 (×2): 100 mg via ORAL
  Filled 2020-02-07 (×2): qty 2

## 2020-02-07 MED ORDER — ALBUTEROL SULFATE (2.5 MG/3ML) 0.083% IN NEBU
INHALATION_SOLUTION | RESPIRATORY_TRACT | Status: AC
  Filled 2020-02-07: qty 12

## 2020-02-07 MED ORDER — AMLODIPINE BESYLATE 5 MG PO TABS
10.0000 mg | ORAL_TABLET | Freq: Once | ORAL | Status: AC
  Administered 2020-02-07: 15:00:00 10 mg via ORAL
  Filled 2020-02-07: qty 2

## 2020-02-07 MED ORDER — GABAPENTIN 300 MG PO CAPS
300.0000 mg | ORAL_CAPSULE | Freq: Every day | ORAL | Status: DC
  Administered 2020-02-08: 09:00:00 300 mg via ORAL
  Filled 2020-02-07: qty 1

## 2020-02-07 MED ORDER — SEVELAMER CARBONATE 800 MG PO TABS
2400.0000 mg | ORAL_TABLET | Freq: Three times a day (TID) | ORAL | Status: DC
  Administered 2020-02-08 (×2): 2400 mg via ORAL
  Filled 2020-02-07 (×2): qty 3

## 2020-02-07 MED ORDER — DEXTROSE 50 % IV SOLN
50.0000 mL | Freq: Once | INTRAVENOUS | Status: DC
  Filled 2020-02-07: qty 50

## 2020-02-07 MED ORDER — PENTAFLUOROPROP-TETRAFLUOROETH EX AERO
1.0000 "application " | INHALATION_SPRAY | CUTANEOUS | Status: DC | PRN
  Filled 2020-02-07: qty 30

## 2020-02-07 MED ORDER — APIXABAN 5 MG PO TABS
5.0000 mg | ORAL_TABLET | Freq: Two times a day (BID) | ORAL | Status: DC
  Administered 2020-02-08 (×2): 5 mg via ORAL
  Filled 2020-02-07 (×2): qty 1

## 2020-02-07 MED ORDER — SODIUM BICARBONATE 8.4 % IV SOLN
50.0000 meq | Freq: Once | INTRAVENOUS | Status: AC
  Administered 2020-02-07: 16:00:00 50 meq via INTRAVENOUS
  Filled 2020-02-07: qty 50

## 2020-02-07 MED ORDER — DEXTROSE 50 % IV SOLN
1.0000 | Freq: Once | INTRAVENOUS | Status: AC
  Administered 2020-02-07: 16:00:00 50 mL via INTRAVENOUS

## 2020-02-07 MED ORDER — RENA-VITE PO TABS
1.0000 | ORAL_TABLET | Freq: Every day | ORAL | Status: DC
  Administered 2020-02-08: 09:00:00 1 via ORAL
  Filled 2020-02-07: qty 1

## 2020-02-07 MED ORDER — ONDANSETRON HCL 4 MG/2ML IJ SOLN
4.0000 mg | Freq: Three times a day (TID) | INTRAMUSCULAR | Status: DC | PRN
  Filled 2020-02-07: qty 2

## 2020-02-07 MED ORDER — NICOTINE 21 MG/24HR TD PT24
21.0000 mg | MEDICATED_PATCH | Freq: Every day | TRANSDERMAL | Status: DC
  Administered 2020-02-08: 09:00:00 21 mg via TRANSDERMAL
  Filled 2020-02-07: qty 1

## 2020-02-07 MED ORDER — RAMIPRIL 10 MG PO CAPS
10.0000 mg | ORAL_CAPSULE | Freq: Every day | ORAL | Status: DC
  Administered 2020-02-08: 09:00:00 10 mg via ORAL
  Filled 2020-02-07 (×2): qty 1

## 2020-02-07 MED ORDER — ATORVASTATIN CALCIUM 20 MG PO TABS: 40.0000 mg | ORAL_TABLET | Freq: Every day | ORAL | Status: DC

## 2020-02-07 MED ORDER — CALCIUM GLUCONATE 10 % IV SOLN
INTRAVENOUS | Status: AC
  Filled 2020-02-07: qty 10

## 2020-02-07 MED ORDER — OXYCODONE-ACETAMINOPHEN 5-325 MG PO TABS: 1.0000 | ORAL_TABLET | ORAL | Status: DC | PRN

## 2020-02-07 MED ORDER — LIDOCAINE HCL (PF) 1 % IJ SOLN
5.0000 mL | INTRAMUSCULAR | Status: DC | PRN
  Filled 2020-02-07: qty 5

## 2020-02-07 MED ORDER — LIDOCAINE-PRILOCAINE 2.5-2.5 % EX CREA
1.0000 "application " | TOPICAL_CREAM | CUTANEOUS | Status: DC | PRN
  Filled 2020-02-07: qty 5

## 2020-02-07 MED ORDER — ACETAMINOPHEN 325 MG PO TABS
650.0000 mg | ORAL_TABLET | Freq: Once | ORAL | Status: AC
  Administered 2020-02-07: 15:00:00 650 mg via ORAL
  Filled 2020-02-07: qty 2

## 2020-02-07 MED ORDER — ACETAMINOPHEN 325 MG PO TABS: 650.0000 mg | ORAL_TABLET | Freq: Four times a day (QID) | ORAL | Status: DC | PRN

## 2020-02-07 MED ORDER — FAMOTIDINE 20 MG PO TABS
20.0000 mg | ORAL_TABLET | Freq: Every day | ORAL | Status: DC
  Administered 2020-02-08: 09:00:00 20 mg via ORAL
  Filled 2020-02-07: qty 1

## 2020-02-07 MED ORDER — ALTEPLASE 2 MG IJ SOLR
2.0000 mg | Freq: Once | INTRAMUSCULAR | Status: DC | PRN
  Filled 2020-02-07: qty 2

## 2020-02-07 MED ORDER — CHLORHEXIDINE GLUCONATE CLOTH 2 % EX PADS
6.0000 | MEDICATED_PAD | Freq: Every day | CUTANEOUS | Status: DC
  Filled 2020-02-07: qty 6

## 2020-02-07 NOTE — ED Notes (Signed)
Pt transferred to dialysis at this time by [facility]

## 2020-02-07 NOTE — ED Provider Notes (Signed)
[facility] Emergency Department Provider Note    First MD Initiated Contact with Patient 02/07/20 1421     (approximate)  I have reviewed the triage vital signs and the nursing notes.   HISTORY  Chief Complaint Back Pain    HPI Brian Fleming is a 74 y.o. male with the below listed past medical history due for dialysis today but missing it due to back pain.  States he feels like he stood up too fast and strained his back.  Denies any falls.  States he is having difficulty walking secondary to the back pain.  States he has had issues like this before.  Denies any fevers.  No chest pain or shortness of breath.  Did not take anything for pain today.  States he normally takes Tylenol.  Denies any numbness or tingling.  No loss of bowel or bladder control.    Past Medical History:  Diagnosis Date  . Atrial flutter ([place])    a. dates back to at least 2013; b. CHADS2VASc 5 (HTN, age x 1, stroke x 2, vascular disease); c. started on Eliquis 06/2019  . DJD (degenerative joint disease)   . End stage renal disease on dialysis ([place])   . Hyperlipidemia   . Hypertension    Family History  Problem Relation Age of Onset  . Hypertension Brother    Past Surgical History:  Procedure Laterality Date  . AV FISTULA PLACEMENT    . BACK SURGERY     Patient Active Problem List   Diagnosis Date Noted  . Fall 07/11/2019  . HCAP (healthcare-associated pneumonia) 01/26/2019  . Pneumonia 01/25/2019  . End stage renal disease ([place]) 12/04/2017  . Essential hypertension 12/04/2017  . Hyperlipidemia 12/04/2017  . Generalized weakness 04/11/2016  . CHF (congestive heart failure) ([place]) 08/20/2015      Prior to Admission medications   Medication Sig Start Date End Date Taking? Authorizing Provider  amLODipine (NORVASC) 10 MG tablet Take 10 mg by mouth at bedtime. 04/29/14   [provider]  apixaban (ELIQUIS) 5 MG TABS tablet Take 1 tablet (5 mg total) by mouth 2  (two) times daily. 07/14/19   Vaughan Basta, MD  atorvastatin (LIPITOR) 40 MG tablet Take 1 tablet (40 mg total) by mouth daily at 6 PM. 04/12/16   Demetrios Loll, MD  B Complex-C-Folic Acid (RENA-VITE PO) Take 1 tablet by mouth daily. 10/27/14   [provider]  gabapentin (NEURONTIN) 300 MG capsule Take 300 mg by mouth daily.     [provider]  ramipril (ALTACE) 10 MG capsule Take 10 mg by mouth daily. 09/09/18   [provider]  ranitidine (ZANTAC) 150 MG capsule Take 150 mg by mouth 2 (two) times daily. 06/25/17   [provider]  sevelamer carbonate (RENVELA) 800 MG tablet Take 2,400 mg by mouth 3 (three) times daily. 06/08/18   [provider]  traMADol (ULTRAM) 50 MG tablet Take 2 tablets (100 mg total) by mouth 2 (two) times daily. 07/14/19   Vaughan Basta, MD    Allergies Patient has no known allergies.    Social History Social History   Tobacco Use  . Smoking status: Current Every Day Smoker    Packs/day: 0.25    Types: Cigarettes  . Smokeless tobacco: Never Used  Substance Use Topics  . Alcohol use: No  . Drug use: No    Review of Systems Patient denies headaches, rhinorrhea, blurry vision, numbness, shortness of breath, chest pain, edema, cough,  abdominal pain, nausea, vomiting, diarrhea, dysuria, fevers, rashes or hallucinations unless otherwise stated above in HPI. ____________________________________________   PHYSICAL EXAM:  VITAL SIGNS: Vitals:   02/07/20 1328 02/07/20 1503  BP: (!) 203/108 (!) 205/104  Pulse: 64 70  Resp: 20 18  Temp: 97.9 F (36.6 C)   SpO2: 100% 97%    Constitutional: Alert, chronically ill appearing Eyes: Conjunctivae are normal.  Head: Atraumatic. Nose: No congestion/rhinnorhea. Mouth/Throat: Mucous membranes are moist.   Neck: No stridor. Painless ROM.  Cardiovascular: Normal rate, regular rhythm. Grossly normal heart sounds.  Good peripheral circulation. Respiratory:  Normal respiratory effort.  No retractions. Lungs CTAB. Gastrointestinal: Soft and nontender. No distention. No abdominal bruits. No CVA tenderness. Genitourinary:  Musculoskeletal: No lower extremity tenderness nor edema.  No joint effusions. Neurologic:  Normal speech and language. No gross focal neurologic deficits are appreciated. No facial droop Skin:  Skin is warm, dry and intact. No rash noted. Psychiatric: Mood and affect are normal. Speech and behavior are normal.  ____________________________________________   LABS (all labs ordered are listed, but only abnormal results are displayed)  Results for orders placed or performed during the hospital encounter of 02/07/20 (from the past 24 hour(s))  Basic metabolic panel     Status: Abnormal   Collection Time: 02/07/20  3:01 PM  Result Value Ref Range   Sodium 142 135 - 145 mmol/L   Potassium >7.5 (HH) 3.5 - 5.1 mmol/L   Chloride 99 98 - 111 mmol/L   CO2 24 22 - 32 mmol/L   Glucose, Bld 83 70 - 99 mg/dL   BUN 94 (H) 8 - 23 mg/dL   Creatinine, Ser 13.40 (H) 0.61 - 1.24 mg/dL   Calcium 9.6 8.9 - 10.3 mg/dL   GFR calc non Af Amer 3 (L) >60 mL/min   GFR calc Af Amer 4 (L) >60 mL/min   Anion gap 19 (H) 5 - 15  CBC with Differential/Platelet     Status: Abnormal   Collection Time: 02/07/20  3:01 PM  Result Value Ref Range   WBC 5.8 4.0 - 10.5 K/uL   RBC 3.44 (L) 4.22 - 5.81 MIL/uL   Hemoglobin 10.6 (L) 13.0 - 17.0 g/dL   HCT 33.5 (L) 39.0 - 52.0 %   MCV 97.4 80.0 - 100.0 fL   MCH 30.8 26.0 - 34.0 pg   MCHC 31.6 30.0 - 36.0 g/dL   RDW 15.1 11.5 - 15.5 %   Platelets 148 (L) 150 - 400 K/uL   nRBC 0.0 0.0 - 0.2 %   Neutrophils Relative % 67 %   Neutro Abs 3.9 1.7 - 7.7 K/uL   Lymphocytes Relative 16 %   Lymphs Abs 0.9 0.7 - 4.0 K/uL   Monocytes Relative 11 %   Monocytes Absolute 0.7 0.1 - 1.0 K/uL   Eosinophils Relative 5 %   Eosinophils Absolute 0.3 0.0 - 0.5 K/uL   Basophils Relative 1 %   Basophils Absolute 0.0 0.0 - 0.1  K/uL   Immature Granulocytes 0 %   Abs Immature Granulocytes 0.02 0.00 - 0.07 K/uL   ____________________________________________  EKG My review and personal interpretation at Time: 14:56   Indication: back pain  Rate: 85  Rhythm: sinus Axis: normal Other: nonspecific st abn with peaked t waves ____________________________________________  RADIOLOGY  I personally reviewed all radiographic images ordered to evaluate for the above acute complaints and reviewed radiology reports and findings.  These findings were personally discussed with the patient.  Please see medical  record for radiology report.  ____________________________________________   PROCEDURES  Procedure(s) performed:  .Critical Care Performed by: Merlyn Lot, MD Authorized by: Merlyn Lot, MD   Critical care provider statement:    Critical care time (minutes):  40   Critical care time was exclusive of:  Separately billable procedures and treating other patients   Critical care was necessary to treat or prevent imminent or life-threatening deterioration of the following conditions:  Renal failure   Critical care was time spent personally by me on the following activities:  Development of treatment plan with patient or surrogate, discussions with consultants, evaluation of patient's response to treatment, examination of patient, obtaining history from patient or surrogate, ordering and performing treatments and interventions, ordering and review of laboratory studies, ordering and review of radiographic studies, pulse oximetry, re-evaluation of patient's condition and review of old charts      Critical Care performed: yes ____________________________________________   INITIAL IMPRESSION / [place] / ED COURSE  Pertinent labs & imaging results that were available during my care of the patient were reviewed by me and considered in my medical decision making (see chart for details).   DDX:  renal failure, musculoskeletal strain, fracture, CHF, medication reaction, electrolyte abnormality  Huck Bellanti is a 74 y.o. who presents to the ED with symptoms as described above.  Patient states he has history of recurrent back pain and feels like he just strained his back however appears that the patient has missed several days of dialysis.  Does look a little bit volume overloaded but is not hypoxic.  EKG did show evidence of peaked T waves.  Does have evidence of acute hyperkalemia.  Discussed case in consultation with nephrology regarding need for urgent dialysis.  Patient was giving temporization medications for stabilization.  Will require hospitalization for further medical management.     The patient was evaluated in Emergency Department today for the symptoms described in the history of present illness. He/she was evaluated in the context of the global COVID-19 pandemic, which necessitated consideration that the patient might be at risk for infection with the SARS-CoV-2 virus that causes COVID-19. Institutional protocols and algorithms that pertain to the evaluation of patients at risk for COVID-19 are in a state of rapid change based on information released by regulatory bodies including the CDC and federal and state organizations. These policies and algorithms were followed during the patient's care in the ED.  As part of my medical decision making, I reviewed the following data within the [place] notes reviewed and incorporated, Labs reviewed, notes from prior ED visits and [place] Controlled Substance Database   ____________________________________________   FINAL CLINICAL IMPRESSION(S) / ED DIAGNOSES  Final diagnoses:  Acute hyperkalemia  ESRD on dialysis ([place])  Chronic midline low back pain without sciatica      NEW MEDICATIONS STARTED DURING THIS VISIT:  New Prescriptions   No medications on file     Note:  This document was prepared using  Dragon voice recognition software and may include unintentional dictation errors.    Merlyn Lot, MD 02/07/20 1556

## 2020-02-07 NOTE — Progress Notes (Signed)
Pre dialysis assessment 

## 2020-02-07 NOTE — ED Notes (Signed)
Called and spoke with son Roderic Palau legal guardian and updated about pt high potassium level and will be going emergently to dialysis.

## 2020-02-07 NOTE — Progress Notes (Signed)
Hd started  

## 2020-02-07 NOTE — Progress Notes (Signed)
This note also relates to the following rows which could not be included: Pulse Rate - Cannot attach notes to unvalidated device data Resp - Cannot attach notes to unvalidated device data BP - Cannot attach notes to unvalidated device data  Hd completed  

## 2020-02-07 NOTE — ED Notes (Signed)
Pt has missed 2 dialysis appointments.  NAD at this time. Unlabored currently.

## 2020-02-07 NOTE — ED Notes (Signed)
Date and time results received: 02/07/20 1542   Test: potassium Critical Value: >7.5  Name of Provider Notified: Quentin Cornwall

## 2020-02-07 NOTE — H&P (Signed)
History and Physical    Brian Fleming [id number] DOB: 08-14-1946 DOA: 02/07/2020  Referring MD/NP/PA:   PCP: System, Pcp Not In   Patient coming from:  The patient is coming from home.  At baseline, pt is independent for most of ADL.        Chief Complaint: back pain  HPI: Brian Fleming is a 74 y.o. male with medical history significant of hypertension, hyperlipidemia, GERD, ESRD-HD, a flutter on Eliquis, tobacco abuse, dCHF, chronic back pain, who presents with back pain.  Patient states that his back pain has worsened in the past several days. He states he feels like he stood up too fast and strained his back.  Denies any falls.  States he is having difficulty walking secondary to the back pain.  States he has had issues like this before. Patient does not have chest pain, cough, shortness of breath.  No nausea, vomiting, diarrhea, abdominal pain.  No numbness or tingling to extremities.  No loss control for bladder or bowel movement.  He missed 2 sessions of dialysis due to back pain.  ED Course: pt was found to have WBC 5.8, pending Covid PCR, potassium>7.5 with T wave peaking on EKG, bicarbonate 24, creatinine 13.4, BUN 94.  Temperature normal, blood pressure 205/104, heart rate 70, oxygen saturation 97% on room air.  Patient is placed on telemetry bed of observation.  Renal was consulted for urgent dialysis.  Review of Systems:   General: no fevers, chills, no body weight gain, has fatigue HEENT: no blurry vision, hearing changes or sore throat Respiratory: no dyspnea, coughing, wheezing CV: no chest pain, no palpitations GI: no nausea, vomiting, abdominal pain, diarrhea, constipation GU: no dysuria, burning on urination, increased urinary frequency, hematuria  Ext: has trace leg edema Neuro: no unilateral weakness, numbness, or tingling, no vision change or hearing loss Skin: no rash, no skin tear. MSK: has back pain Heme: No easy bruising.  Travel history: No recent  long distant travel.  Allergy: No Known Allergies  Past Medical History:  Diagnosis Date  . Atrial flutter ([place])    a. dates back to at least 2013; b. CHADS2VASc 5 (HTN, age x 1, stroke x 2, vascular disease); c. started on Eliquis 06/2019  . DJD (degenerative joint disease)   . End stage renal disease on dialysis ([place])   . Hyperlipidemia   . Hypertension     Past Surgical History:  Procedure Laterality Date  . AV FISTULA PLACEMENT    . BACK SURGERY      Social History:  reports that he has been smoking cigarettes. He has been smoking about 0.25 packs per day. He has never used smokeless tobacco. He reports that he does not drink alcohol or use drugs.  Family History:  Family History  Problem Relation Age of Onset  . Hypertension Brother      Prior to Admission medications   Medication Sig Start Date End Date Taking? Authorizing Provider  amLODipine (NORVASC) 10 MG tablet Take 10 mg by mouth at bedtime. 04/29/14   [provider]  apixaban (ELIQUIS) 5 MG TABS tablet Take 1 tablet (5 mg total) by mouth 2 (two) times daily. 07/14/19   Vaughan Basta, MD  atorvastatin (LIPITOR) 40 MG tablet Take 1 tablet (40 mg total) by mouth daily at 6 PM. 04/12/16   Demetrios Loll, MD  B Complex-C-Folic Acid (RENA-VITE PO) Take 1 tablet by mouth daily. 10/27/14   [provider]  gabapentin (NEURONTIN) 300 MG capsule Take 300  mg by mouth daily.     [provider]  ramipril (ALTACE) 10 MG capsule Take 10 mg by mouth daily. 09/09/18   [provider]  ranitidine (ZANTAC) 150 MG capsule Take 150 mg by mouth 2 (two) times daily. 06/25/17   [provider]  sevelamer carbonate (RENVELA) 800 MG tablet Take 2,400 mg by mouth 3 (three) times daily. 06/08/18   [provider]  traMADol (ULTRAM) 50 MG tablet Take 2 tablets (100 mg total) by mouth 2 (two) times daily. 07/14/19   Vaughan Basta, MD    Physical Exam: Vitals:   02/07/20 1845  02/07/20 1900 02/07/20 1915 02/07/20 1930  BP: (!) 181/83 (!) 188/87 (!) 175/77 (!) 187/150  Pulse: 76 86 82 81  Resp: 18 (!) 27 (!) 25 14  Temp:      TempSrc:      SpO2:      Weight:      Height:       General: Not in acute distress HEENT:       Eyes: PERRL, EOMI, no scleral icterus.       ENT: No discharge from the ears and nose, no pharynx injection, no tonsillar enlargement.        Neck: No JVD, no bruit, no mass felt. Heme: No neck lymph node enlargement. Cardiac: S1/S2, RRR, No murmurs, No gallops or rubs. Respiratory: No rales, wheezing, rhonchi or rubs. GI: Soft, nondistended, nontender, no rebound pain, no organomegaly, BS present. GU: No hematuria Ext: has trace leg edema bilaterally. 2+DP/PT pulse bilaterally. Musculoskeletal: No joint deformities, No joint redness or warmth. Has tenderness in the midline of lowe back Skin: No rashes.  Neuro: Alert, oriented X3, cranial nerves II-XII grossly intact, moves all extremities Psych: Patient is not psychotic, no suicidal or hemocidal ideation.  Labs on Admission: I have personally reviewed following labs and imaging studies  CBC: Recent Labs  Lab 02/07/20 1501  WBC 5.8  NEUTROABS 3.9  HGB 10.6*  HCT 33.5*  MCV 97.4  PLT 123456*   Basic Metabolic Panel: Recent Labs  Lab 02/07/20 1501  NA 142  K >7.5*  CL 99  CO2 24  GLUCOSE 83  BUN 94*  CREATININE 13.40*  CALCIUM 9.6   GFR: Estimated Creatinine Clearance: 4.9 mL/min (A) (by C-G formula based on SCr of 13.4 mg/dL (H)). Liver Function Tests: No results for input(s): AST, ALT, ALKPHOS, BILITOT, PROT, ALBUMIN in the last 168 hours. No results for input(s): LIPASE, AMYLASE in the last 168 hours. No results for input(s): AMMONIA in the last 168 hours. Coagulation Profile: No results for input(s): INR, PROTIME in the last 168 hours. Cardiac Enzymes: No results for input(s): CKTOTAL, CKMB, CKMBINDEX, TROPONINI in the last 168 hours. BNP (last 3 results) No  results for input(s): PROBNP in the last 8760 hours. HbA1C: No results for input(s): HGBA1C in the last 72 hours. CBG: Recent Labs  Lab 02/07/20 1553 02/07/20 1912 02/07/20 1914 02/07/20 1929 02/07/20 1930  GLUCAP 73 54* 52* 50* 55*   Lipid Profile: No results for input(s): CHOL, HDL, LDLCALC, TRIG, CHOLHDL, LDLDIRECT in the last 72 hours. Thyroid Function Tests: No results for input(s): TSH, T4TOTAL, FREET4, T3FREE, THYROIDAB in the last 72 hours. Anemia Panel: No results for input(s): VITAMINB12, FOLATE, FERRITIN, TIBC, IRON, RETICCTPCT in the last 72 hours. Urine analysis: No results found for: COLORURINE, APPEARANCEUR, LABSPEC, PHURINE, GLUCOSEU, HGBUR, BILIRUBINUR, KETONESUR, PROTEINUR, UROBILINOGEN, NITRITE, LEUKOCYTESUR Sepsis Labs: @LABRCNTIP (procalcitonin:4,lacticidven:4) ) Recent Results (from the past 240 hour(s))  Respiratory Panel by RT PCR (Flu A&B, Covid) - Nasopharyngeal Swab     Status: None   Collection Time: 02/07/20  3:54 PM   Specimen: Nasopharyngeal Swab  Result Value Ref Range Status   SARS Coronavirus 2 by RT PCR NEGATIVE NEGATIVE Final    Comment: (NOTE) SARS-CoV-2 target nucleic acids are NOT DETECTED. The SARS-CoV-2 RNA is generally detectable in upper respiratoy specimens during the acute phase of infection. The lowest concentration of SARS-CoV-2 viral copies this assay can detect is 131 copies/mL. A negative result does not preclude SARS-Cov-2 infection and should not be used as the sole basis for treatment or other patient management decisions. A negative result may occur with  improper specimen collection/handling, submission of specimen other than nasopharyngeal swab, presence of viral mutation(s) within the areas targeted by this assay, and inadequate number of viral copies (<131 copies/mL). A negative result must be combined with clinical observations, patient history, and epidemiological information. The expected result is Negative. Fact  Sheet for Patients:  PinkCheek.be Fact Sheet for Healthcare Providers:  GravelBags.it This test is not yet ap proved or cleared by the [place] FDA and  has been authorized for detection and/or diagnosis of SARS-CoV-2 by FDA under an Emergency Use Authorization (EUA). This EUA will remain  in effect (meaning this test can be used) for the duration of the COVID-19 declaration under Section 564(b)(1) of the Act, 21 U.S.C. section 360bbb-3(b)(1), unless the authorization is terminated or revoked sooner.    Influenza A by PCR NEGATIVE NEGATIVE Final   Influenza B by PCR NEGATIVE NEGATIVE Final    Comment: (NOTE) The Xpert Xpress SARS-CoV-2/FLU/RSV assay is intended as an aid in  the diagnosis of influenza from Nasopharyngeal swab specimens and  should not be used as a sole basis for treatment. Nasal washings and  aspirates are unacceptable for Xpert Xpress SARS-CoV-2/FLU/RSV  testing. Fact Sheet for Patients: PinkCheek.be Fact Sheet for Healthcare Providers: GravelBags.it This test is not yet approved or cleared by the [place] FDA and  has been authorized for detection and/or diagnosis of SARS-CoV-2 by  FDA under an Emergency Use Authorization (EUA). This EUA will remain  in effect (meaning this test can be used) for the duration of the  Covid-19 declaration under Section 564(b)(1) of the Act, 21  U.S.C. section 360bbb-3(b)(1), unless the authorization is  terminated or revoked. Performed at [facility], [address] [place], [place] [address]      Radiological Exams on Admission: DG Chest 2 View  Result Date: 02/07/2020 CLINICAL DATA:  Back pain, hypertension, tobacco abuse EXAM: CHEST - 2 VIEW COMPARISON:  01/26/2020 FINDINGS: Frontal and lateral views of the chest demonstrate persistent enlargement the cardiac silhouette. The central vascular  congestion seen previously is somewhat improved. There is still residual interstitial prominence and ground-glass opacities within the lungs consistent with mild fluid overload. No effusion or pneumothorax. No acute bony abnormalities. IMPRESSION: 1. Mild fluid overload, with slight improvement since prior study. Electronically Signed   By: Randa Ngo M.D.   On: 02/07/2020 15:53   DG Lumbar Spine 2-3 Views  Result Date: 02/07/2020 CLINICAL DATA:  Back pain EXAM: LUMBAR SPINE - 2-3 VIEW COMPARISON:  03/20/2010 FINDINGS: Frontal and lateral views of the lumbar spine are obtained. Since prior study, grade 1 anterolisthesis of L4 on L5 has developed. There are diffuse bony changes of renal osteodystrophy, compatible with known end-stage renal disease and dialysis dependence. There is irregularity of the endplates along the anterior aspect  of the L2/L3 disc space. This could reflect amyloid spondyloarthropathy in a patient with a history of chronic al cyst dependence. However, if there are any clinical signs or symptoms of infection, spondylo discitis should be considered. There is multilevel disc space narrowing, greatest at L4/L5 and L5/S1. Diffuse facet hypertrophy is seen, also greatest at L4-5 and L5-S1. Diffuse vascular calcifications are seen. There is ankylosis of the sacroiliac joints. IMPRESSION: 1. Irregularity of the anterior margin of the L2/L3 disc, favor amyloid spondyloarthropathy in a patient with longstanding end-stage renal disease. In the appropriate clinical setting, spondylo discitis could be considered. 2. Extensive multilevel spondylosis and facet hypertrophy. Electronically Signed   By: Randa Ngo M.D.   On: 02/07/2020 15:56     EKG: Independently reviewed.  Sinus rhythm, QTC 476, low voltage, LAE, T wave peaking in V4-V5.     Assessment/Plan Principal Problem:   Hyperkalemia Active Problems:   Essential hypertension   Hyperlipidemia   End stage renal disease on dialysis  ([facility])   HLD (hyperlipidemia)   GERD (gastroesophageal reflux disease)   Atrial flutter ([facility])   Back pain   Tobacco abuse   Hyperkalemia: K>7.5. due to noncompliance to dialysis.  Developed uremia.  Has EKG changes with T wave thickening of V4-V5. -Placed on telemetry bed for observation -Patient was treated with calcium gluconate, D50, sodium bicarbonate and NovoLog in ED -Renal was consulted for urgent dialysis  Essential hypertension: -prn Hydralazine -Continue home medications: Amlodipine, ramipril  Hyperlipidemia -lipitor  End stage renal disease on dialysis: -renal was consulted for urgent dialysis  GERD (gastroesophageal reflux disease): -pepcid  Atrial flutter ([place]): Heart rate is 70.  Patient is not on nodal blocker. -Continue Eliquis  Back pain: Does not seem to have alarming symptoms -prn Percocet and Robaxin -PT/OT  Tobacco abuse -Nicotine patch     DVT ppx: On eliquis Code Status: Full code Family Communication: None, no family at bed side.    Disposition Plan:  Anticipate discharge back to previous home environment Consults called:  renal Admission status: Med-surg bed for obs as inpt    Tele bed for obs   as inpt      SDU/inpation       Date of Service 02/07/2020    [facility]   If 7PM-7AM, please contact night-coverage [email or web address] 02/07/2020, 7:49 PM

## 2020-02-07 NOTE — Progress Notes (Signed)
Patient given three glucose tablets dt finger sticks of 54.  Alert oriented x4 with no complaints. Will repeat bs before end of treatment

## 2020-02-07 NOTE — ED Triage Notes (Signed)
Pt arrives from home via EMS with complaint of back pain since last night. Pt reports he was trying to get out of bed and his "back went out,' and he became unable to get up. He later moved to a chair and then back to bed. He has not had his dialysis treatment today. Pt reports no fall

## 2020-02-08 ENCOUNTER — Other Ambulatory Visit: Payer: Self-pay

## 2020-02-08 ENCOUNTER — Encounter: Payer: Self-pay | Admitting: Emergency Medicine

## 2020-02-08 DIAGNOSIS — E875 Hyperkalemia: Secondary | ICD-10-CM | POA: Diagnosis not present

## 2020-02-08 LAB — CBC
HCT: 30.2 % — ABNORMAL LOW (ref 39.0–52.0)
Hemoglobin: 9.7 g/dL — ABNORMAL LOW (ref 13.0–17.0)
MCH: 30.6 pg (ref 26.0–34.0)
MCHC: 32.1 g/dL (ref 30.0–36.0)
MCV: 95.3 fL (ref 80.0–100.0)
Platelets: 129 10*3/uL — ABNORMAL LOW (ref 150–400)
RBC: 3.17 MIL/uL — ABNORMAL LOW (ref 4.22–5.81)
RDW: 15 % (ref 11.5–15.5)
WBC: 4.9 10*3/uL (ref 4.0–10.5)
nRBC: 0 % (ref 0.0–0.2)

## 2020-02-08 LAB — GLUCOSE, CAPILLARY
Glucose-Capillary: 64 mg/dL — ABNORMAL LOW (ref 70–99)
Glucose-Capillary: 67 mg/dL — ABNORMAL LOW (ref 70–99)
Glucose-Capillary: 68 mg/dL — ABNORMAL LOW (ref 70–99)
Glucose-Capillary: 78 mg/dL (ref 70–99)
Glucose-Capillary: 87 mg/dL (ref 70–99)
Glucose-Capillary: 91 mg/dL (ref 70–99)
Glucose-Capillary: 95 mg/dL (ref 70–99)
Glucose-Capillary: 96 mg/dL (ref 70–99)

## 2020-02-08 LAB — BASIC METABOLIC PANEL
Anion gap: 13 (ref 5–15)
BUN: 43 mg/dL — ABNORMAL HIGH (ref 8–23)
CO2: 31 mmol/L (ref 22–32)
Calcium: 9.3 mg/dL (ref 8.9–10.3)
Chloride: 98 mmol/L (ref 98–111)
Creatinine, Ser: 7.49 mg/dL — ABNORMAL HIGH (ref 0.61–1.24)
GFR calc Af Amer: 8 mL/min — ABNORMAL LOW (ref >60)
GFR calc non Af Amer: 7 mL/min — ABNORMAL LOW (ref >60)
Glucose, Bld: 102 mg/dL — ABNORMAL HIGH (ref 70–99)
Potassium: 4.5 mmol/L (ref 3.5–5.1)
Sodium: 142 mmol/L (ref 135–145)

## 2020-02-08 LAB — PHOSPHORUS: Phosphorus: 6 mg/dL — ABNORMAL HIGH (ref 2.5–4.6)

## 2020-02-08 NOTE — Discharge Summary (Signed)
Physician Discharge Summary  Brian Fleming [id number] DOB: February 10, 1946 DOA: 02/07/2020  PCP: [facility], Pcp Not In  Admit date: 02/07/2020 Discharge date: 02/08/2020  Admitted From: [facility]  Disposition:  [facility]   Recommendations for Outpatient Follow-up:  1. Follow up with PCP in 1-2 weeks  Home Health: None  Equipment/Devices: None  Discharge Condition: Fair  CODE STATUS: FULL Diet recommendation: Renal  Brief/Interim Summary: Brian Fleming is a 74 y.o. M with ESRD on HD MWF, Aflutter on Eliquis, dCHF, smoking and chronic back pain who presented with back pain.  In the ER, found to have K >7.5 mmol/L.   He was admitted for urgent dialysis.     PRINCIPAL HOSPITAL DIAGNOSIS: Hyperkalemia    Discharge Diagnoses:  Hyperkalemia Admitted and underwent urgent dialysis.  Post-dialysis K 4.5.  Asymptomatic.  Re-evaluated by me and Nephrology and stable for discharge.  Hypertension  End-stage renal disease  Atrial flutter, typical  Smoking  Chronic diastolic CHF         Discharge Instructions  Discharge Instructions    Increase activity slowly   Complete by: As directed      Allergies as of 02/08/2020   No Known Allergies     Medication List    TAKE these medications   amLODipine 10 MG tablet Commonly known as: NORVASC Take 10 mg by mouth at bedtime.   apixaban 5 MG Tabs tablet Commonly known as: ELIQUIS Take 1 tablet (5 mg total) by mouth 2 (two) times daily.   atorvastatin 40 MG tablet Commonly known as: LIPITOR Take 1 tablet (40 mg total) by mouth daily at 6 PM.   gabapentin 300 MG capsule Commonly known as: NEURONTIN Take 300 mg by mouth daily.   ramipril 10 MG capsule Commonly known as: ALTACE Take 10 mg by mouth daily.   ranitidine 150 MG capsule Commonly known as: ZANTAC Take 150 mg by mouth 2 (two) times daily.   RENA-VITE PO Take 1 tablet by mouth daily.   rosuvastatin 10 MG tablet Commonly known as: CRESTOR Take 10 mg by mouth  at bedtime.   sevelamer carbonate 800 MG tablet Commonly known as: RENVELA Take 2,400 mg by mouth 3 (three) times daily.   traMADol 50 MG tablet Commonly known as: ULTRAM Take 2 tablets (100 mg total) by mouth 2 (two) times daily.       No Known Allergies  Consultations:  Nephrology   Procedures/Studies: DG Chest 2 View  Result Date: 02/07/2020 CLINICAL DATA:  Back pain, hypertension, tobacco abuse EXAM: CHEST - 2 VIEW COMPARISON:  01/26/2020 FINDINGS: Frontal and lateral views of the chest demonstrate persistent enlargement the cardiac silhouette. The central vascular congestion seen previously is somewhat improved. There is still residual interstitial prominence and ground-glass opacities within the lungs consistent with mild fluid overload. No effusion or pneumothorax. No acute bony abnormalities. IMPRESSION: 1. Mild fluid overload, with slight improvement since prior study. Electronically Signed   By: Randa Ngo M.D.   On: 02/07/2020 15:53   DG Lumbar Spine 2-3 Views  Result Date: 02/07/2020 CLINICAL DATA:  Back pain EXAM: LUMBAR SPINE - 2-3 VIEW COMPARISON:  03/20/2010 FINDINGS: Frontal and lateral views of the lumbar spine are obtained. Since prior study, grade 1 anterolisthesis of L4 on L5 has developed. There are diffuse bony changes of renal osteodystrophy, compatible with known end-stage renal disease and dialysis dependence. There is irregularity of the endplates along the anterior aspect of the L2/L3 disc space. This could reflect amyloid spondyloarthropathy in a patient with  a history of chronic al cyst dependence. However, if there are any clinical signs or symptoms of infection, spondylo discitis should be considered. There is multilevel disc space narrowing, greatest at L4/L5 and L5/S1. Diffuse facet hypertrophy is seen, also greatest at L4-5 and L5-S1. Diffuse vascular calcifications are seen. There is ankylosis of the sacroiliac joints. IMPRESSION: 1. Irregularity of  the anterior margin of the L2/L3 disc, favor amyloid spondyloarthropathy in a patient with longstanding end-stage renal disease. In the appropriate clinical setting, spondylo discitis could be considered. 2. Extensive multilevel spondylosis and facet hypertrophy. Electronically Signed   By: Randa Ngo M.D.   On: 02/07/2020 15:56   US Venous Img Lower Bilateral  Result Date: 01/26/2020 CLINICAL DATA:  Acute bilateral lower extremity pain and swelling. EXAM: BILATERAL LOWER EXTREMITY VENOUS DOPPLER ULTRASOUND TECHNIQUE: Gray-scale sonography with graded compression, as well as color Doppler and duplex ultrasound were performed to evaluate the lower extremity deep venous systems from the level of the common femoral vein and including the common femoral, femoral, profunda femoral, popliteal and calf veins including the posterior tibial, peroneal and gastrocnemius veins when visible. The superficial great saphenous vein was also interrogated. Spectral Doppler was utilized to evaluate flow at rest and with distal augmentation maneuvers in the common femoral, femoral and popliteal veins. COMPARISON:  None. FINDINGS: RIGHT LOWER EXTREMITY Common Femoral Vein: No evidence of thrombus. Normal compressibility, respiratory phasicity and response to augmentation. Saphenofemoral Junction: No evidence of thrombus. Normal compressibility and flow on color Doppler imaging. Profunda Femoral Vein: No evidence of thrombus. Normal compressibility and flow on color Doppler imaging. Femoral Vein: No evidence of thrombus. Normal compressibility, respiratory phasicity and response to augmentation. Popliteal Vein: No evidence of thrombus. Normal compressibility, respiratory phasicity and response to augmentation. Calf Veins: No evidence of thrombus. Normal compressibility and flow on color Doppler imaging. Venous Reflux:  None. Other Findings:  None. LEFT LOWER EXTREMITY Common Femoral Vein: No evidence of thrombus. Normal  compressibility, respiratory phasicity and response to augmentation. Saphenofemoral Junction: No evidence of thrombus. Normal compressibility and flow on color Doppler imaging. Profunda Femoral Vein: No evidence of thrombus. Normal compressibility and flow on color Doppler imaging. Femoral Vein: No evidence of thrombus. Normal compressibility, respiratory phasicity and response to augmentation. Popliteal Vein: No evidence of thrombus. Normal compressibility, respiratory phasicity and response to augmentation. Calf Veins: No evidence of thrombus. Normal compressibility and flow on color Doppler imaging. Venous Reflux:  None. Other Findings: Enlarged left inguinal lymph nodes are noted with the largest measuring 1.2 cm in minor axis with fatty hilum, most likely inflammatory in etiology. IMPRESSION: No evidence of deep venous thrombosis in either lower extremity. Enlarged left inguinal lymph nodes are noted with the largest with minor axis of 1.2 cm. These most likely are inflammatory in etiology, but clinical correlation is recommended to rule out neoplasm. Electronically Signed   By: Marijo Conception M.D.   On: 01/26/2020 11:46   DG Chest Port 1 View  Result Date: 01/26/2020 CLINICAL DATA:  Bilateral calf pain. Post hemodialysis today. EXAM: PORTABLE CHEST 1 VIEW COMPARISON:  Radiographs 07/11/2019 and 01/25/2019. FINDINGS: 1020 hours. Stable cardiomegaly and aortic atherosclerosis. There is a lesser degree of inspiration with resulting vascular crowding at both lung bases. There is possible vascular congestion, but no invert pulmonary edema, confluent airspace opacity, pleural effusion or pneumothorax. The bones appear unchanged. IMPRESSION: Possible mild vascular congestion without overt pulmonary edema. Stable cardiomegaly and aortic atherosclerosis. Electronically Signed   By: Caryl Comes.D.  On: 01/26/2020 10:31       Subjective: Patient feels 9.  No chest pain, shortness of breath, swelling,  palpitations, confusion.  Discharge Exam: Vitals:   02/08/20 0904 02/08/20 1307  BP: 122/68 (!) 153/139  Pulse:  67  Resp:  18  Temp:  98.9 F (37.2 C)  SpO2:  100%   Vitals:   02/08/20 0104 02/08/20 0403 02/08/20 0904 02/08/20 1307  BP: 110/66 (!) 111/91 122/68 (!) 153/139  Pulse: 91 61  67  Resp:  20  18  Temp:  98.4 F (36.9 C)  98.9 F (37.2 C)  TempSrc:  Oral  Oral  SpO2:  97%  100%  Weight: 90.9 kg     Height: 5\' 8"  (1.727 m)       General: Pt is alert, awake, not in acute distress, lying in bed, chronically ill-appearing debilitated elderly man. Cardiovascular: RRR, nl S1-S2, no murmurs appreciated.   No LE edema.   Respiratory: Normal respiratory rate and rhythm.  CTAB without rales or wheezes. Abdominal: Abdomen soft and non-tender.  No distension or HSM.   Neuro/Psych: Strength symmetric in upper and lower extremities.  Judgment and insight appear moderately impaired by dementia.   The results of significant diagnostics from this hospitalization (including imaging, microbiology, ancillary and laboratory) are listed below for reference.     Microbiology: Recent Results (from the past 240 hour(s))  Respiratory Panel by RT PCR (Flu A&B, Covid) - Nasopharyngeal Swab     Status: None   Collection Time: 02/07/20  3:54 PM   Specimen: Nasopharyngeal Swab  Result Value Ref Range Status   SARS Coronavirus 2 by RT PCR NEGATIVE NEGATIVE Final    Comment: (NOTE) SARS-CoV-2 target nucleic acids are NOT DETECTED. The SARS-CoV-2 RNA is generally detectable in upper respiratoy specimens during the acute phase of infection. The lowest concentration of SARS-CoV-2 viral copies this assay can detect is 131 copies/mL. A negative result does not preclude SARS-Cov-2 infection and should not be used as the sole basis for treatment or other patient management decisions. A negative result may occur with  improper specimen collection/handling, submission of specimen other than  nasopharyngeal swab, presence of viral mutation(s) within the areas targeted by this assay, and inadequate number of viral copies (<131 copies/mL). A negative result must be combined with clinical observations, patient history, and epidemiological information. The expected result is Negative. Fact Sheet for Patients:  PinkCheek.be Fact Sheet for Healthcare Providers:  GravelBags.it This test is not yet ap proved or cleared by the [place] FDA and  has been authorized for detection and/or diagnosis of SARS-CoV-2 by FDA under an Emergency Use Authorization (EUA). This EUA will remain  in effect (meaning this test can be used) for the duration of the COVID-19 declaration under Section 564(b)(1) of the Act, 21 U.S.C. section 360bbb-3(b)(1), unless the authorization is terminated or revoked sooner.    Influenza A by PCR NEGATIVE NEGATIVE Final   Influenza B by PCR NEGATIVE NEGATIVE Final    Comment: (NOTE) The Xpert Xpress SARS-CoV-2/FLU/RSV assay is intended as an aid in  the diagnosis of influenza from Nasopharyngeal swab specimens and  should not be used as a sole basis for treatment. Nasal washings and  aspirates are unacceptable for Xpert Xpress SARS-CoV-2/FLU/RSV  testing. Fact Sheet for Patients: PinkCheek.be Fact Sheet for Healthcare Providers: GravelBags.it This test is not yet approved or cleared by the [place] FDA and  has been authorized for detection and/or diagnosis of SARS-CoV-2 by  FDA  under an Emergency Use Authorization (EUA). This EUA will remain  in effect (meaning this test can be used) for the duration of the  Covid-19 declaration under Section 564(b)(1) of the Act, 21  U.S.C. section 360bbb-3(b)(1), unless the authorization is  terminated or revoked. Performed at [facility], [address] [place], [place] [address]       Labs: BNP (last 3 results) Recent Labs    07/12/19 0223 07/13/19 0419 07/14/19 0536  BNP 730.0* 508.0* 0000000*   Basic Metabolic Panel: Recent Labs  Lab 02/07/20 1501 02/08/20 0151  NA 142 142  K >7.5* 4.5  CL 99 98  CO2 24 31  GLUCOSE 83 102*  BUN 94* 43*  CREATININE 13.40* 7.49*  CALCIUM 9.6 9.3  PHOS  --  6.0*   Liver Function Tests: No results for input(s): AST, ALT, ALKPHOS, BILITOT, PROT, ALBUMIN in the last 168 hours. No results for input(s): LIPASE, AMYLASE in the last 168 hours. No results for input(s): AMMONIA in the last 168 hours. CBC: Recent Labs  Lab 02/07/20 1501 02/08/20 0151  WBC 5.8 4.9  NEUTROABS 3.9  --   HGB 10.6* 9.7*  HCT 33.5* 30.2*  MCV 97.4 95.3  PLT 148* 129*   Cardiac Enzymes: No results for input(s): CKTOTAL, CKMB, CKMBINDEX, TROPONINI in the last 168 hours. BNP: Invalid input(s): POCBNP CBG: Recent Labs  Lab 02/08/20 0037 02/08/20 0404 02/08/20 0759 02/08/20 1305 02/08/20 1326  GLUCAP 78 96 91 68* 67*   D-Dimer No results for input(s): DDIMER in the last 72 hours. Hgb A1c No results for input(s): HGBA1C in the last 72 hours. Lipid Profile No results for input(s): CHOL, HDL, LDLCALC, TRIG, CHOLHDL, LDLDIRECT in the last 72 hours. Thyroid function studies No results for input(s): TSH, T4TOTAL, T3FREE, THYROIDAB in the last 72 hours.  Invalid input(s): FREET3 Anemia work up No results for input(s): VITAMINB12, FOLATE, FERRITIN, TIBC, IRON, RETICCTPCT in the last 72 hours. Urinalysis No results found for: COLORURINE, APPEARANCEUR, [place], [place], [place], [place], [place], [place], PROTEINUR, UROBILINOGEN, NITRITE, LEUKOCYTESUR Sepsis Labs Invalid input(s): PROCALCITONIN,  WBC,  LACTICIDVEN Microbiology Recent Results (from the past 240 hour(s))  Respiratory Panel by RT PCR (Flu A&B, Covid) - Nasopharyngeal Swab     Status: None   Collection Time: 02/07/20  3:54 PM   Specimen: Nasopharyngeal Swab  Result  Value Ref Range Status   SARS Coronavirus 2 by RT PCR NEGATIVE NEGATIVE Final    Comment: (NOTE) SARS-CoV-2 target nucleic acids are NOT DETECTED. The SARS-CoV-2 RNA is generally detectable in upper respiratoy specimens during the acute phase of infection. The lowest concentration of SARS-CoV-2 viral copies this assay can detect is 131 copies/mL. A negative result does not preclude SARS-Cov-2 infection and should not be used as the sole basis for treatment or other patient management decisions. A negative result may occur with  improper specimen collection/handling, submission of specimen other than nasopharyngeal swab, presence of viral mutation(s) within the areas targeted by this assay, and inadequate number of viral copies (<131 copies/mL). A negative result must be combined with clinical observations, patient history, and epidemiological information. The expected result is Negative. Fact Sheet for Patients:  PinkCheek.be Fact Sheet for Healthcare Providers:  GravelBags.it This test is not yet ap proved or cleared by the [place] FDA and  has been authorized for detection and/or diagnosis of SARS-CoV-2 by FDA under an Emergency Use Authorization (EUA). This EUA will remain  in effect (meaning this test can be used) for the  duration of the COVID-19 declaration under Section 564(b)(1) of the Act, 21 U.S.C. section 360bbb-3(b)(1), unless the authorization is terminated or revoked sooner.    Influenza A by PCR NEGATIVE NEGATIVE Final   Influenza B by PCR NEGATIVE NEGATIVE Final    Comment: (NOTE) The Xpert Xpress SARS-CoV-2/FLU/RSV assay is intended as an aid in  the diagnosis of influenza from Nasopharyngeal swab specimens and  should not be used as a sole basis for treatment. Nasal washings and  aspirates are unacceptable for Xpert Xpress SARS-CoV-2/FLU/RSV  testing. Fact Sheet for  Patients: PinkCheek.be Fact Sheet for Healthcare Providers: GravelBags.it This test is not yet approved or cleared by the [place] FDA and  has been authorized for detection and/or diagnosis of SARS-CoV-2 by  FDA under an Emergency Use Authorization (EUA). This EUA will remain  in effect (meaning this test can be used) for the duration of the  Covid-19 declaration under Section 564(b)(1) of the Act, 21  U.S.C. section 360bbb-3(b)(1), unless the authorization is  terminated or revoked. Performed at [facility], [address] [place], [place] [address]      Time coordinating discharge: 25 minutes      SIGNED:   Edwin Dada, MD  [facility] 02/08/2020, 1:43 PM

## 2020-02-08 NOTE — Progress Notes (Signed)
Called and spoke with son about discharge. Son said that he will be coming to pick his dad up soon.

## 2020-02-08 NOTE — Progress Notes (Signed)
Established hemodialysis patient known at [facility] MWF 5:45am. Please note that any change in COVID or mobility may change this plan. Please contact me directly for any dialysis placement concerns.

## 2020-02-08 NOTE — Progress Notes (Signed)
[facility]  ROUNDING NOTE   Subjective:  Patient well-known to [place] as we had seen him on prior admissions. Came in yesterday with back pain. Found to have severe hyperkalemia with potassium greater than 7.5. He underwent urgent dialysis yesterday with good improvement in his potassium down to 4.5.   Objective:  Vital signs in last 24 hours:  Temp:  [97.6 F (36.4 C)-98.4 F (36.9 C)] 98.4 F (36.9 C) (02/16 0403) Pulse Rate:  [61-105] 61 (02/16 0403) Resp:  [13-27] 20 (02/16 0403) BP: (110-205)/(66-150) 122/68 (02/16 0904) SpO2:  [97 %-100 %] 97 % (02/16 0403) Weight:  [80.7 kg-90.9 kg] 90.9 kg (02/16 0104)  Weight change:  Filed Weights   02/07/20 1338 02/08/20 0104  Weight: 80.7 kg 90.9 kg    Intake/Output: I/O last 3 completed shifts: In: 120 [P.O.:120] Out: -2000    Intake/Output this shift:  Total I/O In: 240 [P.O.:240] Out: -   Physical Exam: General: No acute distress  Head: Normocephalic, atraumatic. Moist oral mucosal membranes  Eyes: Anicteric  Neck: Supple, trachea midline  Lungs:  Clear to auscultation, normal effort  Heart: S1S2 no rubs  Abdomen:  Soft, nontender, bowel sounds present  Extremities: Trace peripheral edema.  Neurologic: Awake, alert, following commands  Skin: No lesions  Access: LUE AVF    Basic Metabolic Panel: Recent Labs  Lab 02/07/20 1501 02/08/20 0151  NA 142 142  K >7.5* 4.5  CL 99 98  CO2 24 31  GLUCOSE 83 102*  BUN 94* 43*  CREATININE 13.40* 7.49*  CALCIUM 9.6 9.3  PHOS  --  6.0*    Liver Function Tests: No results for input(s): AST, ALT, ALKPHOS, BILITOT, PROT, ALBUMIN in the last 168 hours. No results for input(s): LIPASE, AMYLASE in the last 168 hours. No results for input(s): AMMONIA in the last 168 hours.  CBC: Recent Labs  Lab 02/07/20 1501 02/08/20 0151  WBC 5.8 4.9  NEUTROABS 3.9  --   HGB 10.6* 9.7*  HCT 33.5* 30.2*  MCV 97.4 95.3  PLT 148* 129*    Cardiac Enzymes: No  results for input(s): CKTOTAL, CKMB, CKMBINDEX, TROPONINI in the last 168 hours.  BNP: Invalid input(s): POCBNP  CBG: Recent Labs  Lab 02/07/20 2246 02/07/20 2343 02/08/20 0037 02/08/20 0404 02/08/20 0759  GLUCAP 70 64* 78 96 91    Microbiology: Results for orders placed or performed during the hospital encounter of 02/07/20  Respiratory Panel by RT PCR (Flu A&B, Covid) - Nasopharyngeal Swab     Status: None   Collection Time: 02/07/20  3:54 PM   Specimen: Nasopharyngeal Swab  Result Value Ref Range Status   SARS Coronavirus 2 by RT PCR NEGATIVE NEGATIVE Final    Comment: (NOTE) SARS-CoV-2 target nucleic acids are NOT DETECTED. The SARS-CoV-2 RNA is generally detectable in upper respiratoy specimens during the acute phase of infection. The lowest concentration of SARS-CoV-2 viral copies this assay can detect is 131 copies/mL. A negative result does not preclude SARS-Cov-2 infection and should not be used as the sole basis for treatment or other patient management decisions. A negative result may occur with  improper specimen collection/handling, submission of specimen other than nasopharyngeal swab, presence of viral mutation(s) within the areas targeted by this assay, and inadequate number of viral copies (<131 copies/mL). A negative result must be combined with clinical observations, patient history, and epidemiological information. The expected result is Negative. Fact Sheet for Patients:  PinkCheek.be Fact Sheet for Healthcare Providers:  GravelBags.it This  test is not yet ap proved or cleared by the [place] and  has been authorized for detection and/or diagnosis of SARS-CoV-2 by FDA under an Emergency Use Authorization (EUA). This EUA will remain  in effect (meaning this test can be used) for the duration of the COVID-19 declaration under Section 564(b)(1) of the Act, 21 U.S.C. section  360bbb-3(b)(1), unless the authorization is terminated or revoked sooner.    Influenza A by PCR NEGATIVE NEGATIVE Final   Influenza B by PCR NEGATIVE NEGATIVE Final    Comment: (NOTE) The Xpert Xpress SARS-CoV-2/FLU/RSV assay is intended as an aid in  the diagnosis of influenza from Nasopharyngeal swab specimens and  should not be used as a sole basis for treatment. Nasal washings and  aspirates are unacceptable for Xpert Xpress SARS-CoV-2/FLU/RSV  testing. Fact Sheet for Patients: PinkCheek.be Fact Sheet for Healthcare Providers: GravelBags.it This test is not yet approved or cleared by the [place] FDA and  has been authorized for detection and/or diagnosis of SARS-CoV-2 by  FDA under an Emergency Use Authorization (EUA). This EUA will remain  in effect (meaning this test can be used) for the duration of the  Covid-19 declaration under Section 564(b)(1) of the Act, 21  U.S.C. section 360bbb-3(b)(1), unless the authorization is  terminated or revoked. Performed at [facility], [address] [place], [place] [address]     Coagulation Studies: No results for input(s): LABPROT, INR in the last 72 hours.  Urinalysis: No results for input(s): COLORURINE, LABSPEC, PHURINE, GLUCOSEU, HGBUR, BILIRUBINUR, KETONESUR, PROTEINUR, UROBILINOGEN, NITRITE, LEUKOCYTESUR in the last 72 hours.  Invalid input(s): APPERANCEUR    Imaging: DG Chest 2 View  Result Date: 02/07/2020 CLINICAL DATA:  Back pain, hypertension, tobacco abuse EXAM: CHEST - 2 VIEW COMPARISON:  01/26/2020 FINDINGS: Frontal and lateral views of the chest demonstrate persistent enlargement the cardiac silhouette. The central vascular congestion seen previously is somewhat improved. There is still residual interstitial prominence and ground-glass opacities within the lungs consistent with mild fluid overload. No effusion or pneumothorax. No acute bony  abnormalities. IMPRESSION: 1. Mild fluid overload, with slight improvement since prior study. Electronically Signed   By: Randa Ngo M.D.   On: 02/07/2020 15:53   DG Lumbar Spine 2-3 Views  Result Date: 02/07/2020 CLINICAL DATA:  Back pain EXAM: LUMBAR SPINE - 2-3 VIEW COMPARISON:  03/20/2010 FINDINGS: Frontal and lateral views of the lumbar spine are obtained. Since prior study, grade 1 anterolisthesis of L4 on L5 has developed. There are diffuse bony changes of renal osteodystrophy, compatible with known end-stage renal disease and dialysis dependence. There is irregularity of the endplates along the anterior aspect of the L2/L3 disc space. This could reflect amyloid spondyloarthropathy in a patient with a history of chronic al cyst dependence. However, if there are any clinical signs or symptoms of infection, spondylo discitis should be considered. There is multilevel disc space narrowing, greatest at L4/L5 and L5/S1. Diffuse facet hypertrophy is seen, also greatest at L4-5 and L5-S1. Diffuse vascular calcifications are seen. There is ankylosis of the sacroiliac joints. IMPRESSION: 1. Irregularity of the anterior margin of the L2/L3 disc, favor amyloid spondyloarthropathy in a patient with longstanding end-stage renal disease. In the appropriate clinical setting, spondylo discitis could be considered. 2. Extensive multilevel spondylosis and facet hypertrophy. Electronically Signed   By: Randa Ngo M.D.   On: 02/07/2020 15:56     Medications:   . sodium chloride    . sodium chloride     .  amLODipine  10 mg Oral QHS  . apixaban  5 mg Oral BID  . atorvastatin  40 mg Oral q1800  . Chlorhexidine Gluconate Cloth  6 each Topical Q0600  . dextrose  50 mL Intravenous Once  . famotidine  20 mg Oral Daily  . gabapentin  300 mg Oral Daily  . multivitamin  1 tablet Oral Daily  . nicotine  21 mg Transdermal Daily  . ramipril  10 mg Oral Daily  . sevelamer carbonate  2,400 mg Oral TID WC  .  traMADol  100 mg Oral BID   sodium chloride, sodium chloride, acetaminophen, alteplase, fentaNYL (SUBLIMAZE) injection, heparin, hydrALAZINE, lidocaine (PF), lidocaine-prilocaine, methocarbamol, ondansetron (ZOFRAN) IV, oxyCODONE-acetaminophen, pentafluoroprop-tetrafluoroeth  Assessment/ Plan:  74 y.o. male with end-stage renal disease, hypertension, hyperlipidemia, degenerative disc disease, anemia chronic kidney disease, secondary hyperparathyroidism, admitted with severe hyperkalemia potassium greater than 7.5.  [facility]/[address]/MWF  1.  Severe hyperkalemia.  Potassium was quite high upon admission at greater than 7.5.  Patient underwent urgent dialysis and potassium now down to 4.5.  No urgent need for dialysis today.  We will plan for dialysis again tomorrow if still here otherwise he will resume his normal outpatient dialysis treatment tomorrow.  2.  Anemia of chronic kidney disease.  Hemoglobin 9.7.  Patient will resume Epogen as an outpatient.  3.  Hypertension.  Continue amlodipine and ramipril.  4.  Secondary hyperparathyroidism.  Maintain the patient on Renvela 3 tablets p.o. 3 times daily with meals and monitor serum phosphorus as an outpatient.   LOS: 0 [place] Fanfan 2/16/202112:18 PM

## 2020-02-08 NOTE — Progress Notes (Signed)
PT Cancellation Note  Patient Details Name: Brian Fleming MRN: [id number] DOB: 1946-07-21   Cancelled Treatment:    Reason Eval/Treat Not Completed: Other (comment). Consult received and chart reviewed. Evaluation attempted. Pt sleeping, however easily arousable with verbal cues. Pt refuses all attempts for OOB mobility and eventually begins to ignore therapists questions. He reports he has too much pain at this time. Unable to verbalize home environment or assist at home. Refuses exam. RN notified.   Addilyne Backs 02/08/2020, 9:33 AM Greggory Stallion, PT, DPT [phone]

## 2020-02-08 NOTE — Progress Notes (Signed)
Brian Fleming to be D/C'd Home with son per MD order.  Discussed prescriptions and follow up appointments with the patient. Prescriptions given to patient, medication list explained in detail. Pt verbalized understanding.  Allergies as of 02/08/2020   No Known Allergies      Medication List     TAKE these medications    amLODipine 10 MG tablet Commonly known as: NORVASC Take 10 mg by mouth at bedtime.   apixaban 5 MG Tabs tablet Commonly known as: ELIQUIS Take 1 tablet (5 mg total) by mouth 2 (two) times daily.   atorvastatin 40 MG tablet Commonly known as: LIPITOR Take 1 tablet (40 mg total) by mouth daily at 6 PM.   gabapentin 300 MG capsule Commonly known as: NEURONTIN Take 300 mg by mouth daily.   ramipril 10 MG capsule Commonly known as: ALTACE Take 10 mg by mouth daily.   ranitidine 150 MG capsule Commonly known as: ZANTAC Take 150 mg by mouth 2 (two) times daily.   RENA-VITE PO Take 1 tablet by mouth daily.   rosuvastatin 10 MG tablet Commonly known as: CRESTOR Take 10 mg by mouth at bedtime.   sevelamer carbonate 800 MG tablet Commonly known as: RENVELA Take 2,400 mg by mouth 3 (three) times daily.   traMADol 50 MG tablet Commonly known as: ULTRAM Take 2 tablets (100 mg total) by mouth 2 (two) times daily.        Vitals:   02/08/20 0403 02/08/20 0904  BP: (!) 111/91 122/68  Pulse: 61   Resp: 20   Temp: 98.4 F (36.9 C)   SpO2: 97%     Skin clean, dry and intact without evidence of skin break down, no evidence of skin tears noted. IV catheter discontinued intact. Site without signs and symptoms of complications. Dressing and pressure applied. Pt denies pain at this time. No complaints noted.  An After Visit Summary was printed and given to the patient. Patient escorted via [place], and D/C home via private auto.  [place] A Brian Fleming

## 2020-02-08 NOTE — TOC Progression Note (Signed)
Transition of Care [facility]) - Progression Note    Patient Details  Name: Brian Fleming MRN: [id number] Date of Birth: June 03, 1946  Transition of Care [facility]) CM/SW Contact  Shelbie Ammons, RN Phone Number: 02/08/2020, 3:28 PM  Clinical Narrative:   RNCM received call from [facility] AD for med surg floor with questions re patient's discharge and needing assistance with going home. She reports that son has been contacted due to ride not picking him up but son reports that his care is broke down. Spoke with nurse [place] who reports that patient is not very steady on his feet and he has refused therapy so they really can't tell how well he is moving around. RNCM placed call to son [name] who reports he is currently in [place] due to being broke down. Discussed options for transportation and that a cab probably wouldn't be the best option if he is unsteady, further that we could try and arrange EMS transport but that this might not be covered under his insurance. After some discussion son reported that he could be here to pick patient up between 5 and 6 as they were working on his car right now. Returned call to floor and spoke with Helene Kelp relaying message that patient will be picked up this evening by son.          Expected Discharge Plan and Services           Expected Discharge Date: 02/08/20                                     Social Determinants of Health (SDOH) Interventions    Readmission Risk Interventions Readmission Risk Prevention Plan 07/12/2019  Transportation Screening Complete  PCP or Specialist Appt within 5-7 Days Complete  Home Care Screening Complete  Medication Review (RN CM) Complete  Some recent data might be hidden
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
## 2020-02-08 NOTE — Progress Notes (Signed)
Hypoglycemic Event  CBG: 64  Treatment: 4 oz juice/soda  Symptoms: None  Follow-up CBG: Time: 0030 CBG Result: 78  Possible Reasons for Event: Unknown  Comments/MD notified: Sharion Settler, NP    Beverly Sessions

## 2020-02-08 NOTE — Care Management Obs Status (Signed)
[place] NOTIFICATION   Patient Details  Name: Brian Fleming MRN: [id number] Date of Birth: 21-Jan-1946   Medicare Observation Status Notification Given:  No(admitted obs less than 24 hours)    Beverly Sessions, RN 02/08/2020, 9:06 AM

## 2020-02-09 LAB — PARATHYROID HORMONE, INTACT (NO CA): PTH: 242 pg/mL — ABNORMAL HIGH (ref 15–65)

## 2020-02-10 ENCOUNTER — Emergency Department
Admission: EM | Admit: 2020-02-10 | Discharge: 2020-02-10 | Disposition: A | Discharge status: Home / Self Care | Payer: Medicare Other | Attending: Emergency Medicine | Admitting: Emergency Medicine

## 2020-02-10 ENCOUNTER — Emergency Department: Payer: Medicare Other

## 2020-02-10 ENCOUNTER — Other Ambulatory Visit: Payer: Self-pay

## 2020-02-10 DIAGNOSIS — I5030 Unspecified diastolic (congestive) heart failure: Secondary | ICD-10-CM | POA: Diagnosis not present

## 2020-02-10 DIAGNOSIS — N186 End stage renal disease: Secondary | ICD-10-CM | POA: Insufficient documentation

## 2020-02-10 DIAGNOSIS — Z992 Dependence on renal dialysis: Secondary | ICD-10-CM | POA: Diagnosis not present

## 2020-02-10 DIAGNOSIS — I132 Hypertensive heart and chronic kidney disease with heart failure and with stage 5 chronic kidney disease, or end stage renal disease: Secondary | ICD-10-CM | POA: Insufficient documentation

## 2020-02-10 DIAGNOSIS — R0789 Other chest pain: Secondary | ICD-10-CM | POA: Insufficient documentation

## 2020-02-10 DIAGNOSIS — F17210 Nicotine dependence, cigarettes, uncomplicated: Secondary | ICD-10-CM | POA: Diagnosis not present

## 2020-02-10 DIAGNOSIS — Y9389 Activity, other specified: Secondary | ICD-10-CM | POA: Insufficient documentation

## 2020-02-10 DIAGNOSIS — M546 Pain in thoracic spine: Secondary | ICD-10-CM | POA: Diagnosis not present

## 2020-02-10 DIAGNOSIS — Y999 Unspecified external cause status: Secondary | ICD-10-CM | POA: Diagnosis not present

## 2020-02-10 DIAGNOSIS — Y92003 Bedroom of unspecified non-institutional (private) residence as the place of occurrence of the external cause: Secondary | ICD-10-CM | POA: Insufficient documentation

## 2020-02-10 DIAGNOSIS — W07XXXA Fall from chair, initial encounter: Secondary | ICD-10-CM | POA: Insufficient documentation

## 2020-02-10 DIAGNOSIS — Z7901 Long term (current) use of anticoagulants: Secondary | ICD-10-CM | POA: Diagnosis not present

## 2020-02-10 DIAGNOSIS — M25512 Pain in left shoulder: Secondary | ICD-10-CM | POA: Diagnosis not present

## 2020-02-10 DIAGNOSIS — W19XXXA Unspecified fall, initial encounter: Secondary | ICD-10-CM

## 2020-02-10 DIAGNOSIS — Z79899 Other long term (current) drug therapy: Secondary | ICD-10-CM | POA: Diagnosis not present

## 2020-02-10 MED ORDER — OXYCODONE HCL 5 MG PO TABS
5.0000 mg | ORAL_TABLET | Freq: Once | ORAL | Status: AC
  Administered 2020-02-10: 5 mg via ORAL
  Filled 2020-02-10: qty 1

## 2020-02-10 MED ORDER — LIDOCAINE 5 % EX PTCH: 1.0000 | MEDICATED_PATCH | Freq: Two times a day (BID) | CUTANEOUS | 0 refills | Status: DC

## 2020-02-10 MED ORDER — ACETAMINOPHEN 500 MG PO TABS
1000.0000 mg | ORAL_TABLET | Freq: Once | ORAL | Status: AC
  Administered 2020-02-10: 1000 mg via ORAL
  Filled 2020-02-10: qty 2

## 2020-02-10 MED ORDER — OXYCODONE-ACETAMINOPHEN 5-325 MG PO TABS: 1.0000 | ORAL_TABLET | Freq: Once | ORAL | Status: DC

## 2020-02-10 MED ORDER — LIDOCAINE 5 % EX PTCH
1.0000 | MEDICATED_PATCH | CUTANEOUS | Status: DC
  Administered 2020-02-10: 1 via TRANSDERMAL
  Filled 2020-02-10: qty 1
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
## 2020-02-10 NOTE — ED Provider Notes (Signed)
[facility] Emergency Department Provider Note   ____________________________________________   First MD Initiated Contact with Patient 02/10/20 1557     (approximate)  I have reviewed the triage vital signs and the nursing notes.   HISTORY  Chief Complaint Fall    HPI Brian Fleming is a 74 y.o. male with past medical history of ESRD on HD (MWF), A. fib on Eliquis, and diastolic CHF presents to the ED following fall.  Patient reports that he went to stand up and the chair slid out from in front of him, to fall backwards onto the bed.  He reports striking his mid back and left side, but denies hitting his head or losing consciousness.  He now complains of pain at his mid back, left chest wall, and left shoulder.  He denies any pain in his hips, legs, anterior chest, or abdomen.  He did break an ashtray with a fall and had stepped on a small piece of glass.  Small amount of bleeding was noted from his right big toe, with bleeding now controlled by EMS.  He states he completed a full run of dialysis yesterday and had otherwise been feeling well until the fall.        Past Medical History:  Diagnosis Date  . Atrial flutter ([place])    a. dates back to at least 2013; b. CHADS2VASc 5 (HTN, age x 1, stroke x 2, vascular disease); c. started on Eliquis 06/2019  . DJD (degenerative joint disease)   . End stage renal disease on dialysis ([place])   . Hyperlipidemia   . Hypertension     Patient Active Problem List   Diagnosis Date Noted  . End stage renal disease on dialysis ([place])   . Hyperkalemia   . HLD (hyperlipidemia)   . GERD (gastroesophageal reflux disease)   . Atrial flutter ([place])   . Back pain   . Tobacco abuse   . Fall 07/11/2019  . HCAP (healthcare-associated pneumonia) 01/26/2019  . Pneumonia 01/25/2019  . End stage renal disease ([place]) 12/04/2017  . Essential hypertension 12/04/2017  . Hyperlipidemia 12/04/2017  . Generalized weakness 04/11/2016    . CHF (congestive heart failure) ([place]) 08/20/2015    Past Surgical History:  Procedure Laterality Date  . AV FISTULA PLACEMENT    . BACK SURGERY      Prior to Admission medications   Medication Sig Start Date End Date Taking? Authorizing Provider  amLODipine (NORVASC) 10 MG tablet Take 10 mg by mouth at bedtime. 04/29/14  Yes [provider]  apixaban (ELIQUIS) 5 MG TABS tablet Take 1 tablet (5 mg total) by mouth 2 (two) times daily. 07/14/19  Yes Vaughan Basta, MD  B Complex-C-Folic Acid (RENA-VITE PO) Take 1 tablet by mouth daily. 10/27/14  Yes [provider]  gabapentin (NEURONTIN) 300 MG capsule Take 300 mg by mouth daily.    Yes [provider]  ramipril (ALTACE) 10 MG capsule Take 10 mg by mouth daily. 09/09/18  Yes [provider]  ranitidine (ZANTAC) 150 MG capsule Take 150 mg by mouth 2 (two) times daily. 06/25/17  Yes [provider]  rosuvastatin (CRESTOR) 10 MG tablet Take 10 mg by mouth at bedtime. 01/12/20  Yes [provider]  sevelamer carbonate (RENVELA) 800 MG tablet Take 2,400 mg by mouth 3 (three) times daily. 06/08/18  Yes [provider]  traMADol (ULTRAM) 50 MG tablet Take 2 tablets (100 mg total) by mouth 2 (two) times daily. 07/14/19  Yes  Vaughan Basta, MD    Allergies Patient has no known allergies.  Family History  Problem Relation Age of Onset  . Hypertension Brother     Social History Social History   Tobacco Use  . Smoking status: Current Every Day Smoker    Packs/day: 0.25    Types: Cigarettes  . Smokeless tobacco: Never Used  Substance Use Topics  . Alcohol use: No  . Drug use: No    Review of Systems  Constitutional: No fever/chills Eyes: No visual changes. ENT: No sore throat. Cardiovascular: Denies chest pain.  Positive for chest wall pain. Respiratory: Denies shortness of breath. Gastrointestinal: No abdominal pain.  No nausea, no vomiting.  No diarrhea.  No  constipation. Genitourinary: Negative for dysuria. Musculoskeletal: Positive for back pain. Skin: Negative for rash. Neurological: Negative for headaches, focal weakness or numbness.  ____________________________________________   PHYSICAL EXAM:  VITAL SIGNS: ED Triage Vitals  Enc Vitals Group     BP --      Pulse Rate 02/10/20 1555 73     Resp 02/10/20 1555 18     Temp --      Temp src --      SpO2 02/10/20 1555 97 %     Weight 02/10/20 1556 200 lb 9.9 oz (91 kg)     Height 02/10/20 1556 5\' 8"  (1.727 m)     Head Circumference --      Peak Flow --      Pain Score 02/10/20 1556 8     Pain Loc --      Pain Edu? --      Excl. in [place]? --     Constitutional: Alert and oriented. Eyes: Conjunctivae are normal. Head: Atraumatic. Nose: No congestion/rhinnorhea. Mouth/Throat: Mucous membranes are moist. Neck: Normal ROM, no cervical spine tenderness to palpation. Cardiovascular: Normal rate, regular rhythm. Grossly normal heart sounds.  Left forearm AV fistula with palpable thrill. Respiratory: Normal respiratory effort.  No retractions. Lungs CTAB.  Left lateral chest wall tenderness to palpation. Gastrointestinal: Soft and nontender. No distention. Genitourinary: deferred Musculoskeletal: No lower extremity tenderness nor edema.  Pelvis stable without tenderness.  Midline thoracic and lumbar spinal tenderness to palpation.  Diffuse tenderness to left shoulder. Neurologic:  Normal speech and language. No gross focal neurologic deficits are appreciated. Skin:  Skin is warm, dry and intact. No rash noted. Psychiatric: Mood and affect are normal. Speech and behavior are normal.  ____________________________________________   LABS (all labs ordered are listed, but only abnormal results are displayed)  Labs Reviewed - No data to display   PROCEDURES  Procedure(s) performed (including Critical Care):  Procedures   ____________________________________________   INITIAL  IMPRESSION / ASSESSMENT AND PLAN / ED COURSE       74 year old male presents to the ED following mechanical fall at home where he fell backwards and struck his mid back and left chest wall.  He now complains of pain at these areas but denies hitting his head and has no signs of trauma to his head or neck.  We will check chest x-ray, x-rays of thoracic and lumbar spine, and left shoulder x-ray.  X-rays negative for acute process, pain now improved and blood pressure also noted to be improving.  He is appropriate for discharge home at this time, has dialysis appointment scheduled for tomorrow.  He was counseled to return to the ED for any new or worsening symptoms, patient agrees with plan.      ____________________________________________   FINAL CLINICAL  IMPRESSION(S) / ED DIAGNOSES  Final diagnoses:  Fall, initial encounter  Chest wall pain  Acute left-sided thoracic back pain     ED Discharge Orders    None       Note:  This document was prepared using Dragon voice recognition software and may include unintentional dictation errors.   Blake Divine, MD 02/10/20 [phone]

## 2020-03-01 ENCOUNTER — Telehealth: Payer: Self-pay

## 2020-03-02 ENCOUNTER — Ambulatory Visit: Payer: Medicare Other | Admitting: Student in an Organized Health Care Education/Training Program

## 2020-03-07 ENCOUNTER — Emergency Department
Admission: EM | Admit: 2020-03-07 | Discharge: 2020-03-07 | Disposition: A | Discharge status: Home / Self Care | Payer: Medicare Other | Attending: Emergency Medicine | Admitting: Emergency Medicine

## 2020-03-07 ENCOUNTER — Encounter: Payer: Self-pay | Admitting: Emergency Medicine

## 2020-03-07 ENCOUNTER — Other Ambulatory Visit: Payer: Self-pay

## 2020-03-07 ENCOUNTER — Emergency Department: Payer: Medicare Other

## 2020-03-07 DIAGNOSIS — G8929 Other chronic pain: Secondary | ICD-10-CM | POA: Insufficient documentation

## 2020-03-07 DIAGNOSIS — M545 Low back pain, unspecified: Secondary | ICD-10-CM

## 2020-03-07 DIAGNOSIS — Z992 Dependence on renal dialysis: Secondary | ICD-10-CM | POA: Diagnosis not present

## 2020-03-07 DIAGNOSIS — I132 Hypertensive heart and chronic kidney disease with heart failure and with stage 5 chronic kidney disease, or end stage renal disease: Secondary | ICD-10-CM | POA: Diagnosis not present

## 2020-03-07 DIAGNOSIS — R109 Unspecified abdominal pain: Secondary | ICD-10-CM | POA: Diagnosis not present

## 2020-03-07 DIAGNOSIS — N186 End stage renal disease: Secondary | ICD-10-CM | POA: Diagnosis not present

## 2020-03-07 DIAGNOSIS — I509 Heart failure, unspecified: Secondary | ICD-10-CM | POA: Insufficient documentation

## 2020-03-07 DIAGNOSIS — F17210 Nicotine dependence, cigarettes, uncomplicated: Secondary | ICD-10-CM | POA: Diagnosis not present

## 2020-03-07 LAB — CBC WITH DIFFERENTIAL/PLATELET
Abs Immature Granulocytes: 0.02 10*3/uL (ref 0.00–0.07)
Basophils Absolute: 0.1 10*3/uL (ref 0.0–0.1)
Basophils Relative: 1 %
Eosinophils Absolute: 0.2 10*3/uL (ref 0.0–0.5)
Eosinophils Relative: 4 %
HCT: 35.8 % — ABNORMAL LOW (ref 39.0–52.0)
Hemoglobin: 11.5 g/dL — ABNORMAL LOW (ref 13.0–17.0)
Immature Granulocytes: 0 %
Lymphocytes Relative: 20 %
Lymphs Abs: 0.9 10*3/uL (ref 0.7–4.0)
MCH: 31 pg (ref 26.0–34.0)
MCHC: 32.1 g/dL (ref 30.0–36.0)
MCV: 96.5 fL (ref 80.0–100.0)
Monocytes Absolute: 0.6 10*3/uL (ref 0.1–1.0)
Monocytes Relative: 13 %
Neutro Abs: 2.8 10*3/uL (ref 1.7–7.7)
Neutrophils Relative %: 62 %
Platelets: 183 10*3/uL (ref 150–400)
RBC: 3.71 MIL/uL — ABNORMAL LOW (ref 4.22–5.81)
RDW: 14.6 % (ref 11.5–15.5)
WBC: 4.6 10*3/uL (ref 4.0–10.5)
nRBC: 0 % (ref 0.0–0.2)

## 2020-03-07 LAB — COMPREHENSIVE METABOLIC PANEL
ALT: 14 U/L (ref 0–44)
AST: 19 U/L (ref 15–41)
Albumin: 3.8 g/dL (ref 3.5–5.0)
Alkaline Phosphatase: 67 U/L (ref 38–126)
Anion gap: 20 — ABNORMAL HIGH (ref 5–15)
BUN: 37 mg/dL — ABNORMAL HIGH (ref 8–23)
CO2: 28 mmol/L (ref 22–32)
Calcium: 9 mg/dL (ref 8.9–10.3)
Chloride: 91 mmol/L — ABNORMAL LOW (ref 98–111)
Creatinine, Ser: 7.49 mg/dL — ABNORMAL HIGH (ref 0.61–1.24)
GFR calc Af Amer: 8 mL/min — ABNORMAL LOW (ref >60)
GFR calc non Af Amer: 7 mL/min — ABNORMAL LOW (ref >60)
Glucose, Bld: 104 mg/dL — ABNORMAL HIGH (ref 70–99)
Potassium: 4.5 mmol/L (ref 3.5–5.1)
Sodium: 139 mmol/L (ref 135–145)
Total Bilirubin: 0.9 mg/dL (ref 0.3–1.2)
Total Protein: 7.9 g/dL (ref 6.5–8.1)

## 2020-03-07 LAB — LIPASE, BLOOD: Lipase: 36 U/L (ref 11–51)

## 2020-03-07 MED ORDER — MORPHINE SULFATE (PF) 4 MG/ML IV SOLN
4.0000 mg | Freq: Once | INTRAVENOUS | Status: AC
  Administered 2020-03-07: 4 mg via INTRAVENOUS
  Filled 2020-03-07: qty 1

## 2020-03-07 MED ORDER — TRAMADOL HCL 50 MG PO TABS
50.0000 mg | ORAL_TABLET | Freq: Once | ORAL | Status: AC
  Administered 2020-03-07: 50 mg via ORAL
  Filled 2020-03-07: qty 1

## 2020-03-07 MED ORDER — OXYCODONE-ACETAMINOPHEN 5-325 MG PO TABS
1.0000 | ORAL_TABLET | Freq: Once | ORAL | Status: AC
  Administered 2020-03-07: 1 via ORAL
  Filled 2020-03-07: qty 1

## 2020-03-07 MED ORDER — DOCUSATE SODIUM 100 MG PO CAPS: 100.0000 mg | ORAL_CAPSULE | Freq: Every day | ORAL | 2 refills | Status: AC | PRN

## 2020-03-07 MED ORDER — DOCUSATE SODIUM 100 MG PO CAPS
100.0000 mg | ORAL_CAPSULE | Freq: Once | ORAL | Status: AC
  Administered 2020-03-07: 100 mg via ORAL
  Filled 2020-03-07: qty 1

## 2020-03-07 MED ORDER — DEXAMETHASONE SODIUM PHOSPHATE 10 MG/ML IJ SOLN
10.0000 mg | Freq: Once | INTRAMUSCULAR | Status: AC
  Administered 2020-03-07: 10 mg via INTRAVENOUS
  Filled 2020-03-07: qty 1

## 2020-03-07 MED ORDER — TRAMADOL HCL 50 MG PO TABS: 50.0000 mg | ORAL_TABLET | Freq: Two times a day (BID) | ORAL | 0 refills | Status: AC | PRN

## 2020-03-07 MED ORDER — PREDNISONE 50 MG PO TABS: ORAL_TABLET | ORAL | 0 refills | Status: DC

## 2020-03-07 MED ORDER — MORPHINE SULFATE (PF) 2 MG/ML IV SOLN
2.0000 mg | Freq: Once | INTRAVENOUS | Status: AC
  Administered 2020-03-07: 2 mg via INTRAVENOUS
  Filled 2020-03-07: qty 1

## 2020-03-07 NOTE — ED Notes (Signed)
MD at bedside. 

## 2020-03-07 NOTE — ED Notes (Signed)
Pt caregiver, Charlena Cross, number is [phone]

## 2020-03-07 NOTE — ED Triage Notes (Signed)
Pt arrives via EMS from home reporting left lower back pain that started about 3 months ago and reports increasingly sharp pain this morning. Pt is moaning and verbally expressing pain in back.

## 2020-03-07 NOTE — ED Notes (Signed)
Pt legal guardian updated on pt plan of care. Pt son is legal guardian. Pt son states he is unable to pick pt up, but that pt's caregiver will be picking pt up. Pt legal guardian to call this RN back with ETA.

## 2020-03-07 NOTE — ED Notes (Signed)
Ebony, caregiver, has called [employer] service and paid for pt taxi ride home. Pt awaiting taxi arrival at this time.

## 2020-03-07 NOTE — ED Provider Notes (Signed)
[facility] Emergency Department Provider Note       Time seen: ----------------------------------------- 7:12 AM on 03/07/2020 -----------------------------------------   I have reviewed the triage vital signs and the nursing notes.  HISTORY   Chief Complaint Back Pain    HPI Brian Fleming is a 74 y.o. male with a history of atrial flutter, end-stage renal disease on dialysis, hyperlipidemia, hypertension who presents to the ED for lower back pain that started 3 months ago reports increasingly sharp pain this morning.  He has pain that wraps around his left flank and also goes into his left leg.  Discomfort is 10 out of 10 in the left side and low back  Past Medical History:  Diagnosis Date  . Atrial flutter ([place])    a. dates back to at least 2013; b. CHADS2VASc 5 (HTN, age x 1, stroke x 2, vascular disease); c. started on Eliquis 06/2019  . DJD (degenerative joint disease)   . End stage renal disease on dialysis ([place])   . Hyperlipidemia   . Hypertension     Patient Active Problem List   Diagnosis Date Noted  . End stage renal disease on dialysis ([place])   . Hyperkalemia   . HLD (hyperlipidemia)   . GERD (gastroesophageal reflux disease)   . Atrial flutter ([place])   . Back pain   . Tobacco abuse   . Fall 07/11/2019  . HCAP (healthcare-associated pneumonia) 01/26/2019  . Pneumonia 01/25/2019  . End stage renal disease ([place]) 12/04/2017  . Essential hypertension 12/04/2017  . Hyperlipidemia 12/04/2017  . Generalized weakness 04/11/2016  . CHF (congestive heart failure) ([place]) 08/20/2015    Past Surgical History:  Procedure Laterality Date  . AV FISTULA PLACEMENT    . BACK SURGERY      Allergies Patient has no known allergies.  Social History Social History   Tobacco Use  . Smoking status: Current Every Day Smoker    Packs/day: 0.25    Types: Cigarettes  . Smokeless tobacco: Never Used  Substance Use Topics  . Alcohol use: No  .  Drug use: No    Review of Systems Constitutional: Negative for fever. Cardiovascular: Negative for chest pain. Respiratory: Negative for shortness of breath. Gastrointestinal: Positive for flank pain Musculoskeletal: Positive for low back pain, left leg pain Skin: Negative for rash. Neurological: Negative for headaches, focal weakness or numbness.  All systems negative/normal/unremarkable except as stated in the HPI  ____________________________________________   PHYSICAL EXAM:  VITAL SIGNS: ED Triage Vitals  Enc Vitals Group     BP 03/07/20 0637 (!) 151/80     Pulse Rate 03/07/20 0637 85     Resp 03/07/20 0637 16     Temp --      Temp src --      SpO2 03/07/20 0637 91 %     Weight 03/07/20 0639 200 lb 9.9 oz (91 kg)     Height 03/07/20 0639 5\' 8"  (1.727 m)     Head Circumference --      Peak Flow --      Pain Score 03/07/20 0639 10     Pain Loc --      Pain Edu? --      Excl. in [place]? --     Constitutional: Alert and oriented.  Mild distress from pain Eyes: Conjunctivae are normal. Normal extraocular movements. Cardiovascular: Normal rate, regular rhythm. No murmurs, rubs, or gallops. Respiratory: Normal respiratory effort without tachypnea nor retractions. Breath sounds are clear and equal  bilaterally. No wheezes/rales/rhonchi. Gastrointestinal: Left-sided abdominal tenderness, no rebound or guarding.  Normal bowel sounds. Musculoskeletal: Mild pain with range of motion of the left leg Neurologic:  Normal speech and language. No gross focal neurologic deficits are appreciated.  Skin:  Skin is warm, dry and intact. No rash noted. Psychiatric: Mood and affect are normal. Speech and behavior are normal.  ____________________________________________  ED COURSE:  As part of my medical decision making, I reviewed the following data within the [facility] History obtained from family if available, nursing notes, old chart and ekg, as well as notes from  prior ED visits. Patient presented for flank pain, back pain and left leg pain, we will assess with labs and imaging as indicated at this time.   Procedures  Brian Fleming was evaluated in Emergency Department on 03/07/2020 for the symptoms described in the history of present illness. He was evaluated in the context of the global COVID-19 pandemic, which necessitated consideration that the patient might be at risk for infection with the SARS-CoV-2 virus that causes COVID-19. Institutional protocols and algorithms that pertain to the evaluation of patients at risk for COVID-19 are in a state of rapid change based on information released by regulatory bodies including the CDC and federal and state organizations. These policies and algorithms were followed during the patient's care in the ED.  ____________________________________________   LABS (pertinent positives/negatives)  Labs Reviewed  CBC WITH DIFFERENTIAL/PLATELET - Abnormal; Notable for the following components:      Result Value   RBC 3.71 (*)    Hemoglobin 11.5 (*)    HCT 35.8 (*)    All other components within normal limits  COMPREHENSIVE METABOLIC PANEL - Abnormal; Notable for the following components:   Chloride 91 (*)    Glucose, Bld 104 (*)    BUN 37 (*)    Creatinine, Ser 7.49 (*)    GFR calc non Af Amer 7 (*)    GFR calc Af Amer 8 (*)    Anion gap 20 (*)    All other components within normal limits  LIPASE, BLOOD  URINALYSIS, COMPLETE (UACMP) WITH MICROSCOPIC    RADIOLOGY Images were viewed by me  CT renal protocol IMPRESSION: No visible ureteral stones or hydronephrosis.  Numerous low-density lesions throughout the kidneys bilaterally, some which are partially calcified. These cannot be characterized on this noncontrast study. This could be further evaluated with contrast-enhanced study, ultrasound or MRI.  Cholelithiasis.  Diffuse colonic diverticulosis.  Diffuse aortic and arterial  atherosclerosis. ____________________________________________   DIFFERENTIAL DIAGNOSIS   Renal colic, UTI, pyelonephritis, AAA, degenerative disc disease, sciatica, muscle strain  FINAL ASSESSMENT AND PLAN  Chronic pain   Plan: The patient had presented for back pain and left flank pain. Patient's labs were unremarkable or as expected with end-stage renal disease on dialysis. Patient's imaging did not reveal any acute abnormalities.  He was given pain medicine and we will try course of steroids for him.  I discussed with his family and this is a chronic problem for him.  He is cleared for outpatient follow-up.   Laurence Aly, MD    Note: This note was generated in part or whole with voice recognition software. Voice recognition is usually quite accurate but there are transcription errors that can and very often do occur. I apologize for any typographical errors that were not detected and corrected.     Earleen Newport, MD 03/07/20 1010

## 2020-03-07 NOTE — ED Notes (Signed)
This RN attempted to contact Ebony with no answer

## 2020-03-16 ENCOUNTER — Emergency Department: Payer: Medicare Other

## 2020-03-16 ENCOUNTER — Emergency Department
Admission: EM | Admit: 2020-03-16 | Discharge: 2020-03-16 | Disposition: A | Discharge status: Home / Self Care | Payer: Medicare Other | Attending: Emergency Medicine | Admitting: Emergency Medicine

## 2020-03-16 ENCOUNTER — Other Ambulatory Visit: Payer: Self-pay

## 2020-03-16 DIAGNOSIS — M5442 Lumbago with sciatica, left side: Secondary | ICD-10-CM | POA: Insufficient documentation

## 2020-03-16 DIAGNOSIS — Z79899 Other long term (current) drug therapy: Secondary | ICD-10-CM | POA: Insufficient documentation

## 2020-03-16 DIAGNOSIS — F17210 Nicotine dependence, cigarettes, uncomplicated: Secondary | ICD-10-CM | POA: Diagnosis not present

## 2020-03-16 DIAGNOSIS — I132 Hypertensive heart and chronic kidney disease with heart failure and with stage 5 chronic kidney disease, or end stage renal disease: Secondary | ICD-10-CM | POA: Diagnosis not present

## 2020-03-16 DIAGNOSIS — M5441 Lumbago with sciatica, right side: Secondary | ICD-10-CM | POA: Diagnosis not present

## 2020-03-16 DIAGNOSIS — Z992 Dependence on renal dialysis: Secondary | ICD-10-CM | POA: Insufficient documentation

## 2020-03-16 DIAGNOSIS — W19XXXD Unspecified fall, subsequent encounter: Secondary | ICD-10-CM | POA: Insufficient documentation

## 2020-03-16 DIAGNOSIS — I4892 Unspecified atrial flutter: Secondary | ICD-10-CM | POA: Diagnosis not present

## 2020-03-16 DIAGNOSIS — I509 Heart failure, unspecified: Secondary | ICD-10-CM | POA: Insufficient documentation

## 2020-03-16 DIAGNOSIS — E875 Hyperkalemia: Secondary | ICD-10-CM

## 2020-03-16 DIAGNOSIS — N186 End stage renal disease: Secondary | ICD-10-CM | POA: Diagnosis not present

## 2020-03-16 DIAGNOSIS — M545 Low back pain: Secondary | ICD-10-CM | POA: Diagnosis present

## 2020-03-16 LAB — BLOOD GAS, VENOUS
Acid-Base Excess: 6.9 mmol/L — ABNORMAL HIGH (ref 0.0–2.0)
Bicarbonate: 34.1 mmol/L — ABNORMAL HIGH (ref 20.0–28.0)
O2 Saturation: 40.4 %
Patient temperature: 37
pCO2, Ven: 59 mmHg (ref 44.0–60.0)
pH, Ven: 7.37 (ref 7.250–7.430)
pO2, Ven: <31 mmHg — CL (ref 32.0–45.0)

## 2020-03-16 LAB — CBC WITH DIFFERENTIAL/PLATELET
Abs Immature Granulocytes: 0.08 10*3/uL — ABNORMAL HIGH (ref 0.00–0.07)
Basophils Absolute: 0 10*3/uL (ref 0.0–0.1)
Basophils Relative: 0 %
Eosinophils Absolute: 0.1 10*3/uL (ref 0.0–0.5)
Eosinophils Relative: 2 %
HCT: 36.8 % — ABNORMAL LOW (ref 39.0–52.0)
Hemoglobin: 12.2 g/dL — ABNORMAL LOW (ref 13.0–17.0)
Immature Granulocytes: 1 %
Lymphocytes Relative: 12 %
Lymphs Abs: 0.9 10*3/uL (ref 0.7–4.0)
MCH: 31.4 pg (ref 26.0–34.0)
MCHC: 33.2 g/dL (ref 30.0–36.0)
MCV: 94.8 fL (ref 80.0–100.0)
Monocytes Absolute: 1 10*3/uL (ref 0.1–1.0)
Monocytes Relative: 13 %
Neutro Abs: 5.5 10*3/uL (ref 1.7–7.7)
Neutrophils Relative %: 72 %
Platelets: 178 10*3/uL (ref 150–400)
RBC: 3.88 MIL/uL — ABNORMAL LOW (ref 4.22–5.81)
RDW: 15.2 % (ref 11.5–15.5)
WBC: 7.7 10*3/uL (ref 4.0–10.5)
nRBC: 0 % (ref 0.0–0.2)

## 2020-03-16 LAB — TROPONIN I (HIGH SENSITIVITY)
Troponin I (High Sensitivity): 59 ng/L — ABNORMAL HIGH (ref <18)
Troponin I (High Sensitivity): 57 ng/L — ABNORMAL HIGH (ref <18)

## 2020-03-16 LAB — COMPREHENSIVE METABOLIC PANEL
ALT: 11 U/L (ref 0–44)
AST: 10 U/L — ABNORMAL LOW (ref 15–41)
Albumin: 3.3 g/dL — ABNORMAL LOW (ref 3.5–5.0)
Alkaline Phosphatase: 61 U/L (ref 38–126)
Anion gap: 20 — ABNORMAL HIGH (ref 5–15)
BUN: 99 mg/dL — ABNORMAL HIGH (ref 8–23)
CO2: 27 mmol/L (ref 22–32)
Calcium: 8.7 mg/dL — ABNORMAL LOW (ref 8.9–10.3)
Chloride: 92 mmol/L — ABNORMAL LOW (ref 98–111)
Creatinine, Ser: 12.56 mg/dL — ABNORMAL HIGH (ref 0.61–1.24)
GFR calc Af Amer: 4 mL/min — ABNORMAL LOW (ref >60)
GFR calc non Af Amer: 4 mL/min — ABNORMAL LOW (ref >60)
Glucose, Bld: 107 mg/dL — ABNORMAL HIGH (ref 70–99)
Potassium: 5.7 mmol/L — ABNORMAL HIGH (ref 3.5–5.1)
Sodium: 139 mmol/L (ref 135–145)
Total Bilirubin: 1 mg/dL (ref 0.3–1.2)
Total Protein: 6.8 g/dL (ref 6.5–8.1)

## 2020-03-16 LAB — BRAIN NATRIURETIC PEPTIDE: B Natriuretic Peptide: 480 pg/mL — ABNORMAL HIGH (ref 0.0–100.0)

## 2020-03-16 MED ORDER — OXYCODONE-ACETAMINOPHEN 5-325 MG PO TABS
1.0000 | ORAL_TABLET | Freq: Once | ORAL | Status: AC
  Administered 2020-03-16: 1 via ORAL
  Filled 2020-03-16: qty 1

## 2020-03-16 MED ORDER — HYDROCODONE-ACETAMINOPHEN 5-325 MG PO TABS: 1.0000 | ORAL_TABLET | ORAL | 0 refills | Status: AC | PRN

## 2020-03-16 MED ORDER — ONDANSETRON 8 MG PO TBDP
8.0000 mg | ORAL_TABLET | Freq: Once | ORAL | Status: AC
  Administered 2020-03-16: 16:00:00 8 mg via ORAL
  Filled 2020-03-16: qty 1

## 2020-03-16 MED ORDER — SODIUM ZIRCONIUM CYCLOSILICATE 10 G PO PACK
10.0000 g | PACK | Freq: Once | ORAL | Status: AC
  Administered 2020-03-16: 10 g via ORAL
  Filled 2020-03-16: qty 1

## 2020-03-16 MED ORDER — HYDROMORPHONE HCL 1 MG/ML IJ SOLN
1.0000 mg | Freq: Once | INTRAMUSCULAR | Status: AC
  Administered 2020-03-16: 1 mg via INTRAVENOUS
  Filled 2020-03-16: qty 1

## 2020-03-16 MED ORDER — FENTANYL CITRATE (PF) 100 MCG/2ML IJ SOLN
100.0000 ug | Freq: Once | INTRAMUSCULAR | Status: AC
  Administered 2020-03-16: 100 ug via INTRAMUSCULAR
  Filled 2020-03-16: qty 2

## 2020-03-16 NOTE — ED Notes (Signed)
Pt unable to sign topaz for DC. DC instructions gone over w/ pt and pt's son at p/u. Neither had questions regarding prescriptions or follow up care.

## 2020-03-16 NOTE — ED Notes (Addendum)
Called son of pt that is the legal guardian and informed me that his dad is here to be evaluated.  Per son they have to help assist with the pain medication so that the pt doesn't take too much.

## 2020-03-16 NOTE — ED Notes (Signed)
Pt called out c/o both legs hurting now. Pt stated he took 2 tylenol around 1000 or 1100.

## 2020-03-16 NOTE — ED Triage Notes (Signed)
Pt comes via [facility] from home with c/o lower back pain that radiates down to left leg. Pt states it is right in the middle. Pt states this has been going on and has gotten worse.  Pt denies any CP, SOB or any other symptoms.  Pt states a week ago he did roll off the bed while dreaming. Pt was evaluated in the ED.  Pt states the doctor prescribed him some pain medication but a girl that works for him has been taking it.

## 2020-03-16 NOTE — ED Triage Notes (Signed)
Pt comes into the ED via EMS from home with c/o generalized weakness x2 days, normally gets around the house with a walker and today unable to walk. CBG 143, 98.6 temp, 146/81, HR 80's, 96%RA. A/ox4

## 2020-03-16 NOTE — ED Provider Notes (Signed)
Procedures    ----------------------------------------- 7:54 PM on 03/16/2020 -----------------------------------------  EKG interpreted by me Atrial flutter, rate of 79.  Normal axis and intervals.  Normal QRS ST segments and T waves.  There is 1 PVC on the strip.  Patient has a known history of a flutter, currently rate controlled.   Carrie Mew, MD 03/16/20 [phone]

## 2020-03-16 NOTE — ED Provider Notes (Signed)
[facility] Emergency Department Provider Note  ____________________________________________  Time seen: Approximately 3:19 PM  I have reviewed the triage vital signs and the nursing notes.   HISTORY  Chief Complaint Back Pain    HPI Brian Fleming is a 74 y.o. male who presents the emergency department complaining of sharp back pain.  Patient states that he fell approximately a week ago, was evaluated in the emergency department, discharged with pain medication.  Patient states that the pain is severe.  He states that he was prescribed pain medication but "the girl help with me takes it."  He is in end-stage renal disease patient on dialysis.  He states that he went to dialysis this morning.  He states that the pain in his back is worsening and now radiating to bilateral legs.  No bowel or bladder dysfunction, saddle anesthesia, paresthesias.  Patient denies any new trauma.  He denies any other complaints of fevers or chills, URI symptoms, chest pain, abdominal pain, flank pain.  Patient's son is the legal guardian.  When legal guardian was contacted, they advised that the patient's help and family members have to manage patient's pain medication as he will take "too much."       Past Medical History:  Diagnosis Date  . Atrial flutter ([place])    a. dates back to at least 2013; b. CHADS2VASc 5 (HTN, age x 1, stroke x 2, vascular disease); c. started on Eliquis 06/2019  . DJD (degenerative joint disease)   . End stage renal disease on dialysis ([place])   . Hyperlipidemia   . Hypertension     Patient Active Problem List   Diagnosis Date Noted  . End stage renal disease on dialysis ([place])   . Hyperkalemia   . HLD (hyperlipidemia)   . GERD (gastroesophageal reflux disease)   . Atrial flutter ([place])   . Back pain   . Tobacco abuse   . Fall 07/11/2019  . HCAP (healthcare-associated pneumonia) 01/26/2019  . Pneumonia 01/25/2019  . End stage renal disease ([place])  12/04/2017  . Essential hypertension 12/04/2017  . Hyperlipidemia 12/04/2017  . Generalized weakness 04/11/2016  . CHF (congestive heart failure) ([place]) 08/20/2015    Past Surgical History:  Procedure Laterality Date  . AV FISTULA PLACEMENT    . BACK SURGERY      Prior to Admission medications   Medication Sig Start Date End Date Taking? Authorizing Provider  amLODipine (NORVASC) 10 MG tablet Take 10 mg by mouth at bedtime. 04/29/14   [provider]  apixaban (ELIQUIS) 5 MG TABS tablet Take 1 tablet (5 mg total) by mouth 2 (two) times daily. 07/14/19   Vaughan Basta, MD  B Complex-C-Folic Acid (RENA-VITE PO) Take 1 tablet by mouth daily. 10/27/14   [provider]  docusate sodium (COLACE) 100 MG capsule Take 1 capsule (100 mg total) by mouth daily as needed. 03/07/20 03/07/21  Earleen Newport, MD  gabapentin (NEURONTIN) 300 MG capsule Take 300 mg by mouth daily.     [provider]  HYDROcodone-acetaminophen (NORCO/VICODIN) 5-325 MG tablet Take 1 tablet by mouth every 4 (four) hours as needed for moderate pain. 03/16/20   Jaretzy Lhommedieu, Charline Bills, PA-C  lidocaine (LIDODERM) 5 % Place 1 patch onto the skin every 12 (twelve) hours. Remove & Discard patch within 12 hours or as directed by MD 02/10/20 02/09/21  Blake Divine, MD  predniSONE (DELTASONE) 50 MG tablet Take 1 tablet by mouth daily 03/07/20   Earleen Newport, MD  ramipril (ALTACE) 10 MG capsule Take 10 mg by mouth daily. 09/09/18   [provider]  ranitidine (ZANTAC) 150 MG capsule Take 150 mg by mouth 2 (two) times daily. 06/25/17   [provider]  rosuvastatin (CRESTOR) 10 MG tablet Take 10 mg by mouth at bedtime. 01/12/20   [provider]  sevelamer carbonate (RENVELA) 800 MG tablet Take 2,400 mg by mouth 3 (three) times daily. 06/08/18   [provider]  traMADol (ULTRAM) 50 MG tablet Take 2 tablets (100 mg total) by mouth 2 (two) times daily. 07/14/19    Vaughan Basta, MD  traMADol (ULTRAM) 50 MG tablet Take 1 tablet (50 mg total) by mouth every 12 (twelve) hours as needed for severe pain (for pain no relieved by tylenol). 03/07/20 03/07/21  Earleen Newport, MD    Allergies Patient has no known allergies.  Family History  Problem Relation Age of Onset  . Hypertension Brother     Social History Social History   Tobacco Use  . Smoking status: Current Every Day Smoker    Packs/day: 0.25    Types: Cigarettes  . Smokeless tobacco: Never Used  Substance Use Topics  . Alcohol use: No  . Drug use: No     Review of Systems  Constitutional: No fever/chills Eyes: No visual changes. No discharge ENT: No upper respiratory complaints. Cardiovascular: no chest pain. Respiratory: no cough. No SOB. Gastrointestinal: No abdominal pain.  No nausea, no vomiting.  No diarrhea.  No constipation. Genitourinary: Negative for dysuria. No hematuria Musculoskeletal: Worsening back pain after a fall a week ago.  Radiating pain to bilateral knees Skin: Negative for rash, abrasions, lacerations, ecchymosis. Neurological: Negative for headaches, focal weakness or numbness. 10-point ROS otherwise negative.  ____________________________________________   PHYSICAL EXAM:  VITAL SIGNS: ED Triage Vitals  Enc Vitals Group     BP 03/16/20 1443 (!) 113/92     Pulse Rate 03/16/20 1443 82     Resp 03/16/20 1443 17     Temp 03/16/20 1443 98.2 F (36.8 C)     Temp src --      SpO2 03/16/20 1443 98 %     Weight 03/16/20 1439 200 lb 9.9 oz (91 kg)     Height 03/16/20 1439 5\' 8"  (1.727 m)     Head Circumference --      Peak Flow --      Pain Score 03/16/20 1439 10     Pain Loc --      Pain Edu? --      Excl. in [place]? --      Constitutional: Alert and oriented. Well appearing and in no acute distress. Eyes: Conjunctivae are normal. PERRL. EOMI. Head: Atraumatic. ENT:      Ears:       Nose: No congestion/rhinnorhea.      Mouth/Throat:  Mucous membranes are moist.  Neck: No stridor.    Cardiovascular: Normal rate, regular rhythm. Normal S1 and S2.  Good peripheral circulation. Respiratory: Normal respiratory effort without tachypnea or retractions. Lungs CTAB. Good air entry to the bases with no decreased or absent breath sounds. Gastrointestinal: Bowel sounds 4 quadrants. Soft and nontender to palpation. No guarding or rigidity. No palpable masses. No distention.  Musculoskeletal: Full range of motion to all extremities. No gross deformities appreciated.  Visualization of the lumbar spine reveals no visible signs of trauma.  No abrasions, ecchymosis.  No deformity.  Patient reports diffuse tenderness throughout the lumbar spine region.  Patient is  tender both midline, bilateral paraspinal muscle groups.  No palpable step-off or palpable abnormality.  Patient reports that pain extends into the SI joints, bilateral sciatic notches.  No tenderness to palpation over bilateral hips, bilateral femur, bilateral knees.  Dorsalis pedis pulse and sensation intact bilateral lower extremities. Neurologic:  Normal speech and language. No gross focal neurologic deficits are appreciated.  Skin:  Skin is warm, dry and intact. No rash noted. Psychiatric: Mood and affect are normal. Speech and behavior are normal. Patient exhibits appropriate insight and judgement.   ____________________________________________   LABS (all labs ordered are listed, but only abnormal results are displayed)  Labs Reviewed  CBC WITH DIFFERENTIAL/PLATELET - Abnormal; Notable for the following components:      Result Value   RBC 3.88 (*)    Hemoglobin 12.2 (*)    HCT 36.8 (*)    Abs Immature Granulocytes 0.08 (*)    All other components within normal limits  COMPREHENSIVE METABOLIC PANEL - Abnormal; Notable for the following components:   Potassium 5.7 (*)    Chloride 92 (*)    Glucose, Bld 107 (*)    BUN 99 (*)    Creatinine, Ser 12.56 (*)    Calcium 8.7  (*)    Albumin 3.3 (*)    AST 10 (*)    GFR calc non Af Amer 4 (*)    GFR calc Af Amer 4 (*)    Anion gap 20 (*)    All other components within normal limits  BRAIN NATRIURETIC PEPTIDE - Abnormal; Notable for the following components:   B Natriuretic Peptide 480.0 (*)    All other components within normal limits  BLOOD GAS, VENOUS - Abnormal; Notable for the following components:   pO2, Ven <31.0 (*)    Bicarbonate 34.1 (*)    Acid-Base Excess 6.9 (*)    All other components within normal limits  TROPONIN I (HIGH SENSITIVITY) - Abnormal; Notable for the following components:   Troponin I (High Sensitivity) 59 (*)    All other components within normal limits  TROPONIN I (HIGH SENSITIVITY) - Abnormal; Notable for the following components:   Troponin I (High Sensitivity) 57 (*)    All other components within normal limits   ____________________________________________  EKG   ____________________________________________  RADIOLOGY I personally viewed and evaluated these images as part of my medical decision making, as well as reviewing the written report by the radiologist.  CT Lumbar Spine Wo Contrast  Result Date: 03/16/2020 CLINICAL DATA:  Back pain. History of renal failure. EXAM: CT LUMBAR SPINE WITHOUT CONTRAST TECHNIQUE: Multidetector CT imaging of the lumbar spine was performed without intravenous contrast administration. Multiplanar CT image reconstructions were also generated. COMPARISON:  CT abdomen/pelvis 03/07/2020 and MRI lumbar spine 07/11/2019 FINDINGS: Segmentation: There are five lumbar type vertebral bodies. The last full intervertebral disc space is labeled L5-S1. Alignment: Normal overall alignment. 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 No worrisome bone lesions or acute fracture. I do not see any definite CT findings suspicious for discitis or osteomyelitis.  Paraspinal and other soft tissues: Fairly prominent inflammatory type changes involving the psoas muscles bilaterally, particularly at C2-3. However, this is stable and unchanged since prior CT scan dating back to 07/11/2019. 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 I do not see any fluid collections to suggest an abscess. Disc levels: Stable severe multilevel disc disease and facet disease due to dialysis related spondyloarthropathy. I do  not see any evidence of an acute fracture, discitis or osteomyelitis. IMPRESSION: 1. 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 Stable severe multilevel disc disease and facet disease. 2. No definite CT findings for discitis or osteomyelitis. 3. Stable inflammatory type changes involving the psoas muscles bilaterally, particularly at C2-3. Electronically Signed   By: Marijo Sanes M.D.   On: 03/16/2020 18:27    ____________________________________________    PROCEDURES  Procedure(s) performed:    Procedures    Medications  oxyCODONE-acetaminophen (PERCOCET/ROXICET) 5-325 MG per tablet 1 tablet (1 tablet Oral Given 03/16/20 1548)  fentaNYL (SUBLIMAZE) injection 100 mcg (100 mcg Intramuscular Given 03/16/20 1549)  ondansetron (ZOFRAN-ODT) disintegrating tablet 8 mg (8 mg Oral Given 03/16/20 1549)  HYDROmorphone (DILAUDID) injection 1 mg (1 mg Intravenous Given 03/16/20 1714)  sodium zirconium cyclosilicate (LOKELMA) packet 10 g (10 g Oral Given 03/16/20 2133)     ____________________________________________   INITIAL IMPRESSION / ASSESSMENT AND PLAN / ED COURSE  Pertinent labs & imaging results that were available during my care of the patient were reviewed by me and considered in my medical decision making (see chart for details).  Review of the [place] CSRS was performed in accordance of the [place] prior to dispensing any controlled drugs.  Clinical Course as of Mar 17 2255   Thu Mar 16, 2020  1711 Patient was taken to CT scan 2 times for imaging.  Patient was unable to tolerate the table with Percocet, was brought back, given fentanyl, and still unable to tolerate laying flat on the table.  At this time, patient was refusing any further evaluation.  Patient has a legal guardian, his son.  The son was contacted, advised that we should override the patient's wishes, pursue imaging.  At this time patient will be given 1 mg of Dilaudid and reattempted to scan.   [JC]    Clinical Course User Index [JC] Teigan Manner, Charline Bills, PA-C          Patient's diagnosis is consistent with acute midline low back pain with bilateral sciatica, hyperkalemia, end-stage renal disease.  Patient presented to emergency department complaining of sharp back pain after a fall a week ago.  Patient had been evaluated at that time but is stated that the pain was increasing.  Initially patient was unable to tolerate the CT scan.  Patient refused to attempt a third time.  However as the patient has a legal guardian, I contacted his son who advised that we should override the patient's wishes and proceed with CT.  Patient may given Percocet for the first attempt, fentanyl before the second attempt and patient was given a third dose of pain medicines with Dilaudid which resulted in good success.  Patient had basic labs.  Patient does have an elevated troponin which is at his baseline.  Patient has elevated creatinine, potassium.  Potassium was 5.7 which is definitely elevated but there was no EKG changes.  I discussed the patient with nephrology who advised a dose of Lokelma and following up with his scheduled dialysis in the morning was appropriate.  At this time no other indication for admission.  Patient will have pain medications prescribed and the patient's son will manage pain medication prescription for him..  Patient to follow-up primary care as needed.  Patient should proceed to dialysis tomorrow as  scheduled.  Patient is given ED precautions to return to the ED for any worsening or new symptoms.     ____________________________________________  FINAL CLINICAL IMPRESSION(S) /  ED DIAGNOSES  Final diagnoses:  Acute midline low back pain with bilateral sciatica  Hyperkalemia  ESRD (end stage renal disease) on dialysis ([place])      NEW MEDICATIONS STARTED DURING THIS VISIT:  ED Discharge Orders         Ordered    HYDROcodone-acetaminophen (NORCO/VICODIN) 5-325 MG tablet  Every 4 hours PRN     03/16/20 2116              This chart was dictated using voice recognition software/Dragon. Despite best efforts to proofread, errors can occur which can change the meaning. Any change was purely unintentional.    Darletta Moll, PA-C 03/16/20 2256    Merlyn Lot, MD 03/16/20 [phone]

## 2020-03-16 NOTE — ED Notes (Signed)
Pt c/o back pain that starts in the left lower back and radiates down to the left knee. Pt states his back hurts regularly but today was so bad he couldn't get out of bed. Pt denies injury or fall however states he had a fall "a few months ago".

## 2020-03-22 ENCOUNTER — Emergency Department: Payer: Medicare Other

## 2020-03-22 ENCOUNTER — Other Ambulatory Visit: Payer: Self-pay

## 2020-03-22 ENCOUNTER — Inpatient Hospital Stay
Admission: EM | Admit: 2020-03-22 | Discharge: 2020-04-04 | DRG: 539 | Disposition: A | Discharge status: Skilled Nursing Facility | Payer: Medicare Other | Attending: Family Medicine | Admitting: Family Medicine

## 2020-03-22 DIAGNOSIS — M069 Rheumatoid arthritis, unspecified: Secondary | ICD-10-CM | POA: Diagnosis present

## 2020-03-22 DIAGNOSIS — N186 End stage renal disease: Secondary | ICD-10-CM | POA: Diagnosis present

## 2020-03-22 DIAGNOSIS — Z538 Procedure and treatment not carried out for other reasons: Secondary | ICD-10-CM | POA: Diagnosis present

## 2020-03-22 DIAGNOSIS — M545 Low back pain, unspecified: Secondary | ICD-10-CM

## 2020-03-22 DIAGNOSIS — M48061 Spinal stenosis, lumbar region without neurogenic claudication: Secondary | ICD-10-CM | POA: Diagnosis present

## 2020-03-22 DIAGNOSIS — G629 Polyneuropathy, unspecified: Secondary | ICD-10-CM | POA: Diagnosis present

## 2020-03-22 DIAGNOSIS — E785 Hyperlipidemia, unspecified: Secondary | ICD-10-CM | POA: Diagnosis present

## 2020-03-22 DIAGNOSIS — M479 Spondylosis, unspecified: Secondary | ICD-10-CM | POA: Diagnosis present

## 2020-03-22 DIAGNOSIS — N250 Renal osteodystrophy: Secondary | ICD-10-CM | POA: Diagnosis present

## 2020-03-22 DIAGNOSIS — Z7901 Long term (current) use of anticoagulants: Secondary | ICD-10-CM

## 2020-03-22 DIAGNOSIS — Z992 Dependence on renal dialysis: Secondary | ICD-10-CM

## 2020-03-22 DIAGNOSIS — Z20822 Contact with and (suspected) exposure to covid-19: Secondary | ICD-10-CM | POA: Diagnosis present

## 2020-03-22 DIAGNOSIS — Z8673 Personal history of transient ischemic attack (TIA), and cerebral infarction without residual deficits: Secondary | ICD-10-CM

## 2020-03-22 DIAGNOSIS — M549 Dorsalgia, unspecified: Secondary | ICD-10-CM | POA: Diagnosis not present

## 2020-03-22 DIAGNOSIS — I120 Hypertensive chronic kidney disease with stage 5 chronic kidney disease or end stage renal disease: Secondary | ICD-10-CM | POA: Diagnosis present

## 2020-03-22 DIAGNOSIS — N2581 Secondary hyperparathyroidism of renal origin: Secondary | ICD-10-CM | POA: Diagnosis present

## 2020-03-22 DIAGNOSIS — M4646 Discitis, unspecified, lumbar region: Secondary | ICD-10-CM | POA: Diagnosis present

## 2020-03-22 DIAGNOSIS — I4892 Unspecified atrial flutter: Secondary | ICD-10-CM | POA: Diagnosis present

## 2020-03-22 DIAGNOSIS — D631 Anemia in chronic kidney disease: Secondary | ICD-10-CM | POA: Diagnosis present

## 2020-03-22 DIAGNOSIS — G8929 Other chronic pain: Secondary | ICD-10-CM | POA: Diagnosis present

## 2020-03-22 DIAGNOSIS — E875 Hyperkalemia: Secondary | ICD-10-CM | POA: Diagnosis present

## 2020-03-22 DIAGNOSIS — F17210 Nicotine dependence, cigarettes, uncomplicated: Secondary | ICD-10-CM | POA: Diagnosis present

## 2020-03-22 DIAGNOSIS — Z79899 Other long term (current) drug therapy: Secondary | ICD-10-CM

## 2020-03-22 DIAGNOSIS — Z111 Encounter for screening for respiratory tuberculosis: Secondary | ICD-10-CM

## 2020-03-22 DIAGNOSIS — I10 Essential (primary) hypertension: Secondary | ICD-10-CM | POA: Diagnosis present

## 2020-03-22 DIAGNOSIS — M4800 Spinal stenosis, site unspecified: Secondary | ICD-10-CM | POA: Diagnosis present

## 2020-03-22 DIAGNOSIS — M4626 Osteomyelitis of vertebra, lumbar region: Secondary | ICD-10-CM | POA: Diagnosis not present

## 2020-03-22 DIAGNOSIS — Z8249 Family history of ischemic heart disease and other diseases of the circulatory system: Secondary | ICD-10-CM

## 2020-03-22 DIAGNOSIS — D649 Anemia, unspecified: Secondary | ICD-10-CM | POA: Diagnosis present

## 2020-03-22 LAB — COMPREHENSIVE METABOLIC PANEL
ALT: 13 U/L (ref 0–44)
AST: 12 U/L — ABNORMAL LOW (ref 15–41)
Albumin: 3.1 g/dL — ABNORMAL LOW (ref 3.5–5.0)
Alkaline Phosphatase: 62 U/L (ref 38–126)
Anion gap: 21 — ABNORMAL HIGH (ref 5–15)
BUN: 81 mg/dL — ABNORMAL HIGH (ref 8–23)
CO2: 24 mmol/L (ref 22–32)
Calcium: 8.4 mg/dL — ABNORMAL LOW (ref 8.9–10.3)
Chloride: 92 mmol/L — ABNORMAL LOW (ref 98–111)
Creatinine, Ser: 13.32 mg/dL — ABNORMAL HIGH (ref 0.61–1.24)
GFR calc Af Amer: 4 mL/min — ABNORMAL LOW (ref >60)
GFR calc non Af Amer: 3 mL/min — ABNORMAL LOW (ref >60)
Glucose, Bld: 75 mg/dL (ref 70–99)
Potassium: 5.7 mmol/L — ABNORMAL HIGH (ref 3.5–5.1)
Sodium: 137 mmol/L (ref 135–145)
Total Bilirubin: 1 mg/dL (ref 0.3–1.2)
Total Protein: 6.8 g/dL (ref 6.5–8.1)

## 2020-03-22 LAB — CBC WITH DIFFERENTIAL/PLATELET
Abs Immature Granulocytes: 0.05 10*3/uL (ref 0.00–0.07)
Basophils Absolute: 0 10*3/uL (ref 0.0–0.1)
Basophils Relative: 1 %
Eosinophils Absolute: 0.1 10*3/uL (ref 0.0–0.5)
Eosinophils Relative: 2 %
HCT: 33.6 % — ABNORMAL LOW (ref 39.0–52.0)
Hemoglobin: 11 g/dL — ABNORMAL LOW (ref 13.0–17.0)
Immature Granulocytes: 1 %
Lymphocytes Relative: 9 %
Lymphs Abs: 0.7 10*3/uL (ref 0.7–4.0)
MCH: 31.3 pg (ref 26.0–34.0)
MCHC: 32.7 g/dL (ref 30.0–36.0)
MCV: 95.7 fL (ref 80.0–100.0)
Monocytes Absolute: 0.9 10*3/uL (ref 0.1–1.0)
Monocytes Relative: 11 %
Neutro Abs: 6.3 10*3/uL (ref 1.7–7.7)
Neutrophils Relative %: 76 %
Platelets: 175 10*3/uL (ref 150–400)
RBC: 3.51 MIL/uL — ABNORMAL LOW (ref 4.22–5.81)
RDW: 15.4 % (ref 11.5–15.5)
WBC: 8.1 10*3/uL (ref 4.0–10.5)
nRBC: 0 % (ref 0.0–0.2)

## 2020-03-22 LAB — SEDIMENTATION RATE: Sed Rate: 86 mm/hr — ABNORMAL HIGH (ref 0–20)

## 2020-03-22 LAB — C-REACTIVE PROTEIN: CRP: 19.1 mg/dL — ABNORMAL HIGH (ref <1.0)

## 2020-03-22 MED ORDER — PATIROMER SORBITEX CALCIUM 8.4 G PO PACK
8.4000 g | PACK | Freq: Every day | ORAL | Status: DC
  Administered 2020-03-22 – 2020-03-24 (×2): 8.4 g via ORAL
  Filled 2020-03-22 (×3): qty 1

## 2020-03-22 MED ORDER — HYDROMORPHONE HCL 1 MG/ML IJ SOLN
1.0000 mg | Freq: Once | INTRAMUSCULAR | Status: AC
  Administered 2020-03-22: 1 mg via INTRAVENOUS
  Filled 2020-03-22: qty 1

## 2020-03-22 MED ORDER — HYDROMORPHONE HCL 1 MG/ML IJ SOLN
0.5000 mg | Freq: Once | INTRAMUSCULAR | Status: AC
  Administered 2020-03-23: 0.5 mg via INTRAVENOUS
  Filled 2020-03-22: qty 1

## 2020-03-22 NOTE — ED Provider Notes (Addendum)
[facility] Emergency Department Provider Note  ____________________________________________   First MD Initiated Contact with Patient 03/22/20 1606     (approximate)  I have reviewed the triage vital signs and the nursing notes.   HISTORY  Chief Complaint Back Pain    HPI Brian Fleming is a 74 y.o. male  With h/o AFlutter, HTN, HLD, chronic back pain, ESRD on MWF HD here with severe back pain. Pt has been seen here now 3x in the last 2 weeks for this. His pain began two weeks ago, gradually, and has progressively worsened. Pain is aching, throbbing, localized to his mid lower thoracic and lumbar spine. Describes it as 10/10 in severity. Pain radiates down her right leg. No numbness. No loss of bowel or bladder function. No fevers but has had some chills, general weakness. He has been unable to go to dialysis x 2 times due to this pain.        Past Medical History:  Diagnosis Date  . Atrial flutter ([place])    a. dates back to at least 2013; b. CHADS2VASc 5 (HTN, age x 1, stroke x 2, vascular disease); c. started on Eliquis 06/2019  . DJD (degenerative joint disease)   . End stage renal disease on dialysis ([place])   . Hyperlipidemia   . Hypertension     Patient Active Problem List   Diagnosis Date Noted  . End stage renal disease on dialysis ([place])   . Hyperkalemia   . HLD (hyperlipidemia)   . GERD (gastroesophageal reflux disease)   . Atrial flutter ([place])   . Back pain   . Tobacco abuse   . Fall 07/11/2019  . HCAP (healthcare-associated pneumonia) 01/26/2019  . Pneumonia 01/25/2019  . End stage renal disease ([place]) 12/04/2017  . Essential hypertension 12/04/2017  . Hyperlipidemia 12/04/2017  . Generalized weakness 04/11/2016  . CHF (congestive heart failure) ([place]) 08/20/2015    Past Surgical History:  Procedure Laterality Date  . AV FISTULA PLACEMENT    . BACK SURGERY      Prior to Admission medications   Medication Sig Start Date End  Date Taking? Authorizing Provider  gabapentin (NEURONTIN) 300 MG capsule Take 300 mg by mouth daily.    Yes [provider]  HYDROcodone-acetaminophen (NORCO/VICODIN) 5-325 MG tablet Take 1 tablet by mouth every 4 (four) hours as needed for moderate pain. 03/16/20  Yes Cuthriell, Charline Bills, PA-C  rosuvastatin (CRESTOR) 10 MG tablet Take 10 mg by mouth at bedtime. 01/12/20  Yes [provider]  sevelamer carbonate (RENVELA) 800 MG tablet Take 2,400 mg by mouth 3 (three) times daily. 06/08/18  Yes [provider]  amLODipine (NORVASC) 10 MG tablet Take 10 mg by mouth at bedtime. 04/29/14   [provider]  apixaban (ELIQUIS) 5 MG TABS tablet Take 1 tablet (5 mg total) by mouth 2 (two) times daily. 07/14/19   Vaughan Basta, MD  B Complex-C-Folic Acid (RENA-VITE PO) Take 1 tablet by mouth daily. 10/27/14   [provider]  docusate sodium (COLACE) 100 MG capsule Take 1 capsule (100 mg total) by mouth daily as needed. 03/07/20 03/07/21  Earleen Newport, MD  lidocaine (LIDODERM) 5 % Place 1 patch onto the skin every 12 (twelve) hours. Remove & Discard patch within 12 hours or as directed by MD 02/10/20 02/09/21  Blake Divine, MD  ramipril (ALTACE) 10 MG capsule Take 10 mg by mouth daily. 09/09/18   [provider]  traMADol (ULTRAM) 50 MG tablet Take  1 tablet (50 mg total) by mouth every 12 (twelve) hours as needed for severe pain (for pain no relieved by tylenol). 03/07/20 03/07/21  Earleen Newport, MD    Allergies Patient has no known allergies.  Family History  Problem Relation Age of Onset  . Hypertension Brother     Social History Social History   Tobacco Use  . Smoking status: Current Every Day Smoker    Packs/day: 0.25    Types: Cigarettes  . Smokeless tobacco: Never Used  Substance Use Topics  . Alcohol use: No  . Drug use: No    Review of Systems  Review of Systems  Constitutional: Positive for chills and fatigue.  Negative for fever.  HENT: Negative for sore throat.   Respiratory: Negative for shortness of breath.   Cardiovascular: Negative for chest pain.  Gastrointestinal: Negative for abdominal pain.  Genitourinary: Negative for flank pain.  Musculoskeletal: Positive for back pain, gait problem and myalgias. Negative for neck pain.  Skin: Negative for rash and wound.  Allergic/Immunologic: Negative for immunocompromised state.  Neurological: Positive for numbness. Negative for weakness.  Hematological: Does not bruise/bleed easily.  All other systems reviewed and are negative.    ____________________________________________  PHYSICAL EXAM:      VITAL SIGNS: ED Triage Vitals  Enc Vitals Group     BP 03/22/20 1608 (!) 175/86     Pulse Rate 03/22/20 1608 84     Resp 03/22/20 1608 16     Temp 03/22/20 1608 98.3 F (36.8 C)     Temp Source 03/22/20 1608 Oral     SpO2 03/22/20 1607 94 %     Weight 03/22/20 1609 200 lb 9.9 oz (91 kg)     Height 03/22/20 1609 5' 8" (1.727 m)     Head Circumference --      Peak Flow --      Pain Score 03/22/20 1608 10     Pain Loc --      Pain Edu? --      Excl. in [place]? --      Physical Exam Vitals and nursing note reviewed.  Constitutional:      General: He is not in acute distress.    Appearance: He is well-developed.     Comments: Chronically ill appearing  HENT:     Head: Normocephalic and atraumatic.  Eyes:     Conjunctiva/sclera: Conjunctivae normal.  Cardiovascular:     Rate and Rhythm: Normal rate and regular rhythm.     Heart sounds: Normal heart sounds.  Pulmonary:     Effort: Pulmonary effort is normal. No respiratory distress.     Breath sounds: No wheezing.  Abdominal:     General: There is no distension.  Musculoskeletal:     Cervical back: Neck supple.     Comments: Severe, R>L paraspinal and midline lower thoracic and midline lumbar pain. No deformity. No step offs.  Skin:    General: Skin is warm.     Capillary Refill:  Capillary refill takes less than 2 seconds.     Findings: No rash.  Neurological:     Mental Status: He is alert and oriented to person, place, and time.     Motor: No abnormal muscle tone.       ____________________________________________   LABS (all labs ordered are listed, but only abnormal results are displayed)  Labs Reviewed  CBC WITH DIFFERENTIAL/PLATELET - Abnormal; Notable for the following components:      Result Value  RBC 3.51 (*)    Hemoglobin 11.0 (*)    HCT 33.6 (*)    All other components within normal limits  COMPREHENSIVE METABOLIC PANEL - Abnormal; Notable for the following components:   Potassium 5.7 (*)    Chloride 92 (*)    BUN 81 (*)    Creatinine, Ser 13.32 (*)    Calcium 8.4 (*)    Albumin 3.1 (*)    AST 12 (*)    GFR calc non Af Amer 3 (*)    GFR calc Af Amer 4 (*)    Anion gap 21 (*)    All other components within normal limits  SEDIMENTATION RATE - Abnormal; Notable for the following components:   Sed Rate 86 (*)    All other components within normal limits  C-REACTIVE PROTEIN - Abnormal; Notable for the following components:   CRP 19.1 (*)    All other components within normal limits    ____________________________________________  EKG:  ________________________________________  RADIOLOGY All imaging, including plain films, CT scans, and ultrasounds, independently reviewed by me, and interpretations confirmed via formal radiology reads.  ED MD interpretation:   MR Pending  Official radiology report(s): No results found.  ____________________________________________  PROCEDURES   Procedure(s) performed (including Critical Care):  Procedures  ____________________________________________  INITIAL IMPRESSION / MDM / [place] / ED COURSE  As part of my medical decision making, I reviewed the following data within the [place] notes reviewed and incorporated, Old chart reviewed, Notes  from prior ED visits, and [place] Controlled Substance Database       *Brian Fleming was evaluated in Emergency Department on 03/22/2020 for the symptoms described in the history of present illness. He was evaluated in the context of the global COVID-19 pandemic, which necessitated consideration that the patient might be at risk for infection with the SARS-CoV-2 virus that causes COVID-19. Institutional protocols and algorithms that pertain to the evaluation of patients at risk for COVID-19 are in a state of rapid change based on information released by regulatory bodies including the CDC and federal and state organizations. These policies and algorithms were followed during the patient's care in the ED.  Some ED evaluations and interventions may be delayed as a result of limited staffing during the pandemic.*     Medical Decision Making:  74 yo M with h/o ESRD, HTN, AFlutter, here with ongoing, severe midline lower back pain. Afebrile here but pain is severe and progressively worsening. DDx includes known DDD and lumbar radiculopathy, renal osteodystrophy, but must also consider fragility fracture, osteo or diskitis. WBC normal but CRP, ESR both elevated. Given recurrent visits for same, will check MR. No LE weakness, numbness, or sx of cauda equina. No abd pain and recent CT A/P negative. He does not make urine.  Otherwise, mild hyperkalemia noted. No EKG changes. [place] given.   Plan to f/u MRI, pain control, and reassess.  ____________________________________________  FINAL CLINICAL IMPRESSION(S) / ED DIAGNOSES  Final diagnoses:  Acute midline low back pain without sciatica     MEDICATIONS GIVEN DURING THIS VISIT:  Medications  patiromer Daryll Drown) packet 8.4 g (8.4 g Oral Given 03/22/20 2044)  HYDROmorphone (DILAUDID) injection 0.5 mg (0 mg Intravenous Hold 03/22/20 2109)  HYDROmorphone (DILAUDID) injection 1 mg (1 mg Intravenous Given 03/22/20 1754)     ED Discharge Orders    None        Note:  This document was prepared using Dragon voice recognition software and may  include unintentional dictation errors.   Duffy Bruce, MD 03/22/20 2038    Duffy Bruce, MD 03/22/20 2124

## 2020-03-22 NOTE — ED Notes (Signed)
This RN on phone with DSS worker to update on pt. Pt's legal guardian: Elaina Hoops [phone] will be the one to contact for discharge.

## 2020-03-22 NOTE — ED Notes (Signed)
Pt asleep.

## 2020-03-22 NOTE — ED Triage Notes (Addendum)
Pt to ED via [facility] from home. Per EMS pt c/o chronic back pain that has gotten worse x3 days. Pt is dialysis pt that receives dialysis MWF. Per Ems pt received dialysis Monday but not today. Per EMS DSS is pt's legal guardian. Per EMS pt A&Ox4.   Upon arrival pt moaning with c/o of lower back pain and requesting patch. Smell of ammonia present.

## 2020-03-22 NOTE — ED Notes (Signed)
This RN and Museum/gallery curator, Therapist, sports at bedside. Pt soiled with stool and urine. Pt dirty clothes placed in pt bag. Pt cleaned and dry/clean brief placed on pt. Pt soiled bedding placed in pt specific bag as well.

## 2020-03-22 NOTE — ED Notes (Signed)
Pt given meal tray at this time 

## 2020-03-22 NOTE — ED Notes (Signed)
MRI called, theyre going to notify this RN prior to coming to get pt so he can be medicated beforehand

## 2020-03-23 ENCOUNTER — Emergency Department: Payer: Medicare Other

## 2020-03-23 DIAGNOSIS — I120 Hypertensive chronic kidney disease with stage 5 chronic kidney disease or end stage renal disease: Secondary | ICD-10-CM | POA: Diagnosis present

## 2020-03-23 DIAGNOSIS — M549 Dorsalgia, unspecified: Secondary | ICD-10-CM

## 2020-03-23 DIAGNOSIS — N186 End stage renal disease: Secondary | ICD-10-CM | POA: Diagnosis present

## 2020-03-23 DIAGNOSIS — Z7901 Long term (current) use of anticoagulants: Secondary | ICD-10-CM | POA: Diagnosis not present

## 2020-03-23 DIAGNOSIS — Z20822 Contact with and (suspected) exposure to covid-19: Secondary | ICD-10-CM | POA: Diagnosis present

## 2020-03-23 DIAGNOSIS — M069 Rheumatoid arthritis, unspecified: Secondary | ICD-10-CM | POA: Diagnosis present

## 2020-03-23 DIAGNOSIS — G8929 Other chronic pain: Secondary | ICD-10-CM | POA: Diagnosis present

## 2020-03-23 DIAGNOSIS — M48061 Spinal stenosis, lumbar region without neurogenic claudication: Secondary | ICD-10-CM | POA: Diagnosis present

## 2020-03-23 DIAGNOSIS — I10 Essential (primary) hypertension: Secondary | ICD-10-CM | POA: Diagnosis not present

## 2020-03-23 DIAGNOSIS — Z538 Procedure and treatment not carried out for other reasons: Secondary | ICD-10-CM | POA: Diagnosis present

## 2020-03-23 DIAGNOSIS — E785 Hyperlipidemia, unspecified: Secondary | ICD-10-CM | POA: Diagnosis present

## 2020-03-23 DIAGNOSIS — Z992 Dependence on renal dialysis: Secondary | ICD-10-CM | POA: Diagnosis not present

## 2020-03-23 DIAGNOSIS — Z8673 Personal history of transient ischemic attack (TIA), and cerebral infarction without residual deficits: Secondary | ICD-10-CM | POA: Diagnosis not present

## 2020-03-23 DIAGNOSIS — D631 Anemia in chronic kidney disease: Secondary | ICD-10-CM | POA: Diagnosis present

## 2020-03-23 DIAGNOSIS — N2581 Secondary hyperparathyroidism of renal origin: Secondary | ICD-10-CM | POA: Diagnosis present

## 2020-03-23 DIAGNOSIS — Z8249 Family history of ischemic heart disease and other diseases of the circulatory system: Secondary | ICD-10-CM | POA: Diagnosis not present

## 2020-03-23 DIAGNOSIS — N250 Renal osteodystrophy: Secondary | ICD-10-CM | POA: Diagnosis present

## 2020-03-23 DIAGNOSIS — Z79899 Other long term (current) drug therapy: Secondary | ICD-10-CM | POA: Diagnosis not present

## 2020-03-23 DIAGNOSIS — M479 Spondylosis, unspecified: Secondary | ICD-10-CM | POA: Diagnosis present

## 2020-03-23 DIAGNOSIS — M4646 Discitis, unspecified, lumbar region: Secondary | ICD-10-CM | POA: Diagnosis present

## 2020-03-23 DIAGNOSIS — I4892 Unspecified atrial flutter: Secondary | ICD-10-CM | POA: Diagnosis present

## 2020-03-23 DIAGNOSIS — E875 Hyperkalemia: Secondary | ICD-10-CM | POA: Diagnosis present

## 2020-03-23 DIAGNOSIS — G629 Polyneuropathy, unspecified: Secondary | ICD-10-CM | POA: Diagnosis present

## 2020-03-23 DIAGNOSIS — M4626 Osteomyelitis of vertebra, lumbar region: Secondary | ICD-10-CM | POA: Diagnosis present

## 2020-03-23 DIAGNOSIS — F17210 Nicotine dependence, cigarettes, uncomplicated: Secondary | ICD-10-CM | POA: Diagnosis present

## 2020-03-23 LAB — PHOSPHORUS: Phosphorus: 10 mg/dL — ABNORMAL HIGH (ref 2.5–4.6)

## 2020-03-23 MED ORDER — APIXABAN 5 MG PO TABS: 5.0000 mg | ORAL_TABLET | Freq: Two times a day (BID) | ORAL | Status: DC

## 2020-03-23 MED ORDER — ONDANSETRON HCL 4 MG PO TABS: 4.0000 mg | ORAL_TABLET | Freq: Four times a day (QID) | ORAL | Status: DC | PRN

## 2020-03-23 MED ORDER — CEFAZOLIN SODIUM-DEXTROSE 1-4 GM/50ML-% IV SOLN
1.0000 g | Freq: Once | INTRAVENOUS | Status: AC
  Administered 2020-03-23: 1 g via INTRAVENOUS
  Filled 2020-03-23: qty 50

## 2020-03-23 MED ORDER — ACETAMINOPHEN 500 MG PO TABS
1000.0000 mg | ORAL_TABLET | Freq: Three times a day (TID) | ORAL | Status: DC
  Administered 2020-03-23 (×2): 1000 mg via ORAL
  Filled 2020-03-23 (×2): qty 2

## 2020-03-23 MED ORDER — ONDANSETRON HCL 4 MG/2ML IJ SOLN: 4.0000 mg | Freq: Four times a day (QID) | INTRAMUSCULAR | Status: DC | PRN

## 2020-03-23 MED ORDER — MORPHINE SULFATE (PF) 2 MG/ML IV SOLN
2.0000 mg | INTRAVENOUS | Status: DC | PRN
  Administered 2020-03-24: 2 mg via INTRAVENOUS
  Filled 2020-03-23 (×2): qty 1

## 2020-03-23 MED ORDER — HEPARIN SODIUM (PORCINE) 5000 UNIT/ML IJ SOLN: 5000.0000 [IU] | Freq: Three times a day (TID) | INTRAMUSCULAR | Status: DC

## 2020-03-23 MED ORDER — LABETALOL HCL 5 MG/ML IV SOLN
10.0000 mg | INTRAVENOUS | Status: DC | PRN
  Administered 2020-03-23: 10 mg via INTRAVENOUS
  Filled 2020-03-23: qty 4

## 2020-03-23 MED ORDER — APIXABAN 5 MG PO TABS
5.0000 mg | ORAL_TABLET | Freq: Two times a day (BID) | ORAL | Status: AC
  Administered 2020-03-23 – 2020-03-24 (×4): 5 mg via ORAL
  Filled 2020-03-23 (×4): qty 1

## 2020-03-23 MED ORDER — RAMIPRIL 10 MG PO CAPS
10.0000 mg | ORAL_CAPSULE | Freq: Every day | ORAL | Status: DC
  Administered 2020-03-23 – 2020-04-04 (×9): 10 mg via ORAL
  Filled 2020-03-23 (×13): qty 1

## 2020-03-23 MED ORDER — LIDOCAINE 5 % EX PTCH
2.0000 | MEDICATED_PATCH | Freq: Two times a day (BID) | CUTANEOUS | Status: DC
  Administered 2020-03-24 – 2020-04-04 (×18): 2 via TRANSDERMAL
  Filled 2020-03-23 (×26): qty 2

## 2020-03-23 MED ORDER — HYDRALAZINE HCL 50 MG PO TABS
50.0000 mg | ORAL_TABLET | Freq: Three times a day (TID) | ORAL | Status: DC
  Administered 2020-03-24 – 2020-03-30 (×15): 50 mg via ORAL
  Filled 2020-03-23 (×18): qty 1

## 2020-03-23 MED ORDER — SENNOSIDES-DOCUSATE SODIUM 8.6-50 MG PO TABS
1.0000 | ORAL_TABLET | Freq: Every evening | ORAL | Status: DC | PRN
  Administered 2020-04-01: 1 via ORAL
  Filled 2020-03-23: qty 1

## 2020-03-23 MED ORDER — AMLODIPINE BESYLATE 10 MG PO TABS
10.0000 mg | ORAL_TABLET | Freq: Every day | ORAL | Status: DC
  Administered 2020-03-24 – 2020-03-29 (×5): 10 mg via ORAL
  Filled 2020-03-23 (×6): qty 1
  Filled 2020-03-23: qty 2

## 2020-03-23 MED ORDER — ACETAMINOPHEN 325 MG PO TABS: 650.0000 mg | ORAL_TABLET | Freq: Four times a day (QID) | ORAL | Status: DC | PRN

## 2020-03-23 MED ORDER — HYDROCODONE-ACETAMINOPHEN 5-325 MG PO TABS
1.0000 | ORAL_TABLET | ORAL | Status: DC | PRN
  Administered 2020-03-23: 1 via ORAL
  Administered 2020-03-24: 2 via ORAL
  Filled 2020-03-23: qty 2
  Filled 2020-03-23: qty 1

## 2020-03-23 MED ORDER — DEXAMETHASONE SODIUM PHOSPHATE 10 MG/ML IJ SOLN
10.0000 mg | Freq: Once | INTRAMUSCULAR | Status: AC
  Administered 2020-03-23: 10 mg via INTRAVENOUS
  Filled 2020-03-23: qty 1

## 2020-03-23 MED ORDER — ACETAMINOPHEN 650 MG RE SUPP: 650.0000 mg | Freq: Four times a day (QID) | RECTAL | Status: DC | PRN

## 2020-03-23 NOTE — Progress Notes (Signed)
Pre HD  

## 2020-03-23 NOTE — Progress Notes (Signed)
Assumed care of patient.

## 2020-03-23 NOTE — ED Notes (Addendum)
Pt was soiled. Pt cleaned up and new brief and chux pad placed on pt and bed. Pt placed in clean gown. Pt given warm blankets.   Pt placed back on monitor.   Pt resting comfortably at this time.

## 2020-03-23 NOTE — ED Notes (Signed)
Pt given sandwich tray and drink.   Messaged Admit MD concerning pt's BP.

## 2020-03-23 NOTE — ED Notes (Signed)
ED TO INPATIENT HANDOFF REPORT  ED Nurse Name and Phone #: [phone]  S Name/Age/Gender Brian Fleming 74 y.o. male Room/Bed: ED31A/ED31A  Code Status   Code Status: Full Code  Home/SNF/Other Skilled nursing facility Patient oriented to: self, place Is this baseline? Yes   Triage Complete: Triage complete  Chief Complaint Intractable back pain [M54.9]  Triage Note Pt to ED via [facility] from home. Per EMS pt c/o chronic back pain that has gotten worse x3 days. Pt is dialysis pt that receives dialysis MWF. Per Ems pt received dialysis Monday but not today. Per EMS DSS is pt's legal guardian. Per EMS pt A&Ox4.   Upon arrival pt moaning with c/o of lower back pain and requesting patch. Smell of ammonia present.      Allergies No Known Allergies  Level of Care/Admitting Diagnosis ED Disposition    ED Disposition Condition [facility] Area: [place] [100120]  Level of Care: Med-Surg [16]  Covid Evaluation: Asymptomatic Screening Protocol (No Symptoms)  Diagnosis: Intractable back pain [720110]  Admitting Physician: Athena Masse [[id number]]  Attending Physician: Athena Masse [[id number]]       B Medical/Surgery History Past Medical History:  Diagnosis Date  . Atrial flutter ([place])    a. dates back to at least 2013; b. CHADS2VASc 5 (HTN, age x 1, stroke x 2, vascular disease); c. started on Eliquis 06/2019  . DJD (degenerative joint disease)   . End stage renal disease on dialysis ([employer])   . Hyperlipidemia   . Hypertension    Past Surgical History:  Procedure Laterality Date  . AV FISTULA PLACEMENT    . BACK SURGERY       A IV Location/Drains/Wounds Patient Lines/Drains/Airways Status   Active Line/Drains/Airways    Name:   Placement date:   Placement time:   Site:   Days:   Peripheral IV 03/22/20 Right Antecubital   03/22/20    1753    Antecubital   1   Fistula / Graft Left Forearm Arteriovenous fistula   --    --     Forearm             Intake/Output Last 24 hours  Intake/Output Summary (Last 24 hours) at 03/23/2020 1236 Last data filed at 03/23/2020 0410 Gross per 24 hour  Intake 50 ml  Output --  Net 50 ml    Labs/Imaging Results for orders placed or performed during the hospital encounter of 03/22/20 (from the past 48 hour(s))  CBC with Differential     Status: Abnormal   Collection Time: 03/22/20  5:52 PM  Result Value Ref Range   WBC 8.1 4.0 - 10.5 K/uL   RBC 3.51 (L) 4.22 - 5.81 MIL/uL   Hemoglobin 11.0 (L) 13.0 - 17.0 g/dL   HCT 33.6 (L) 39.0 - 52.0 %   MCV 95.7 80.0 - 100.0 fL   MCH 31.3 26.0 - 34.0 pg   MCHC 32.7 30.0 - 36.0 g/dL   RDW 15.4 11.5 - 15.5 %   Platelets 175 150 - 400 K/uL   nRBC 0.0 0.0 - 0.2 %   Neutrophils Relative % 76 %   Neutro Abs 6.3 1.7 - 7.7 K/uL   Lymphocytes Relative 9 %   Lymphs Abs 0.7 0.7 - 4.0 K/uL   Monocytes Relative 11 %   Monocytes Absolute 0.9 0.1 - 1.0 K/uL   Eosinophils Relative 2 %   Eosinophils Absolute 0.1 0.0 - 0.5 K/uL  Basophils Relative 1 %   Basophils Absolute 0.0 0.0 - 0.1 K/uL   Immature Granulocytes 1 %   Abs Immature Granulocytes 0.05 0.00 - 0.07 K/uL    Comment: Performed at [facility], [address] [place], [place] [address]  Comprehensive metabolic panel     Status: Abnormal   Collection Time: 03/22/20  5:52 PM  Result Value Ref Range   Sodium 137 135 - 145 mmol/L    Comment: LYTES RERAN KLW   Potassium 5.7 (H) 3.5 - 5.1 mmol/L   Chloride 92 (L) 98 - 111 mmol/L   CO2 24 22 - 32 mmol/L   Glucose, Bld 75 70 - 99 mg/dL    Comment: Glucose reference range applies only to samples taken after fasting for at least 8 hours.   BUN 81 (H) 8 - 23 mg/dL   Creatinine, Ser 13.32 (H) 0.61 - 1.24 mg/dL   Calcium 8.4 (L) 8.9 - 10.3 mg/dL   Total Protein 6.8 6.5 - 8.1 g/dL   Albumin 3.1 (L) 3.5 - 5.0 g/dL   AST 12 (L) 15 - 41 U/L   ALT 13 0 - 44 U/L   Alkaline Phosphatase 62 38 - 126 U/L   Total Bilirubin 1.0 0.3 -  1.2 mg/dL   GFR calc non Af Amer 3 (L) >60 mL/min   GFR calc Af Amer 4 (L) >60 mL/min   Anion gap 21 (H) 5 - 15    Comment: Performed at [facility], [address] [place], [place] [address]  Sedimentation rate     Status: Abnormal   Collection Time: 03/22/20  5:52 PM  Result Value Ref Range   Sed Rate 86 (H) 0 - 20 mm/hr    Comment: Performed at [facility], [place], [place] [address]  C-reactive protein     Status: Abnormal   Collection Time: 03/22/20  5:52 PM  Result Value Ref Range   CRP 19.1 (H) <1.0 mg/dL    Comment: Performed at [facility], [place] [address], [place], [place] [address]   MR THORACIC SPINE WO CONTRAST  Result Date: 03/23/2020 CLINICAL DATA:  Back pain EXAM: MRI THORACIC AND LUMBAR SPINE WITHOUT CONTRAST TECHNIQUE: Multiplanar and multiecho pulse sequences of the thoracic and lumbar spine were obtained without intravenous contrast. COMPARISON:  Thoracolumbar spine MRI 07/11/2019 FINDINGS: MRI THORACIC SPINE FINDINGS Alignment:  Physiologic. Vertebrae: Endplate remodeling at G9-20 is unchanged compared to 07/11/2019. There is multifocal bone marrow heterogeneity, also unchanged. No acute abnormality. Cord: Assessment of the spinal cord is degraded by motion artifact. There may be a small area of hyperintense T2-weighted signal within the spinal cord at T9-10. Paraspinal and other soft tissues: Negative Disc levels: T9-10: Diffuse disc bulge with narrowing of the ventral thecal sac. No spinal canal stenosis. Other disc levels are unremarkable. MRI LUMBAR SPINE FINDINGS Segmentation:  Normal Alignment: Grade 1 retrolisthesis at L3-4 and grade 1 anterolisthesis at L4-5 Vertebrae: Heterogeneous bone marrow signal with type 2 Modic changes at L4-5. There is edema within the L2-3 disc space. No adjacent endplate edema. There is low T1-weighted signal throughout most of the lumbar spine. Conus medullaris and cauda equina: Conus extends to  the L1-2 level. Conus and cauda equina appear normal. Paraspinal and other soft tissues: Atrophy of the psoas musculature. Diffuse cystic change of both kidneys. Disc levels: T12-L1: Normal disc space and facet joints. There is no spinal canal stenosis. No neural foraminal stenosis. L1-L2: Normal disc  space and facet joints. There is no spinal canal stenosis. No neural foraminal stenosis. L2-L3: Disc space edema with diffuse bulge and large osteophytes. Unchanged severe spinal canal stenosis. Unchanged severe bilateral neural foraminal stenosis. L3-L4: Diffuse disc bulge, slightly worsened. Slightly worsened severe spinal canal stenosis. No neural foraminal stenosis. L4-L5: Normal disc space and facet joints. Severe disc space narrowing with intermediate sized disc osteophyte complex and severe facet arthrosis. Unchanged severe spinal canal stenosis. Unchanged severe bilateral neural foraminal stenosis. L5-S1: Mild disc bulge and moderate facet arthrosis. There is no spinal canal stenosis. Unchanged mild left neural foraminal stenosis. Visualized sacrum: Normal. IMPRESSION: 1. Mild progression of endplate remodeling at R4-2 with slightly increased disc space edema. No edema within the endplates. 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 Correlation with CRP might be helpful. 2. Unchanged severe lumbar spinal canal stenosis and severe bilateral neural foraminal stenosis at L2-3 and L4-5 with marked mass effect on the cauda equina nerve roots at these levels. 3. 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 Electronically Signed   By: Ulyses Jarred M.D.   On: 03/23/2020 01:51   MR LUMBAR SPINE WO CONTRAST  Result Date: 03/23/2020 CLINICAL DATA:  Back pain EXAM: MRI THORACIC AND LUMBAR SPINE WITHOUT CONTRAST TECHNIQUE: Multiplanar and multiecho pulse  sequences of the thoracic and lumbar spine were obtained without intravenous contrast. COMPARISON:  Thoracolumbar spine MRI 07/11/2019 FINDINGS: MRI THORACIC SPINE FINDINGS Alignment:  Physiologic. Vertebrae: Endplate remodeling at B7-62 is unchanged compared to 07/11/2019. There is multifocal bone marrow heterogeneity, also unchanged. No acute abnormality. Cord: Assessment of the spinal cord is degraded by motion artifact. There may be a small area of hyperintense T2-weighted signal within the spinal cord at T9-10. Paraspinal and other soft tissues: Negative Disc levels: T9-10: Diffuse disc bulge with narrowing of the ventral thecal sac. No spinal canal stenosis. Other disc levels are unremarkable. MRI LUMBAR SPINE FINDINGS Segmentation:  Normal Alignment: Grade 1 retrolisthesis at L3-4 and grade 1 anterolisthesis at L4-5 Vertebrae: Heterogeneous bone marrow signal with type 2 Modic changes at L4-5. There is edema within the L2-3 disc space. No adjacent endplate edema. There is low T1-weighted signal throughout most of the lumbar spine. Conus medullaris and cauda equina: Conus extends to the L1-2 level. Conus and cauda equina appear normal. Paraspinal and other soft tissues: Atrophy of the psoas musculature. Diffuse cystic change of both kidneys. Disc levels: T12-L1: Normal disc space and facet joints. There is no spinal canal stenosis. No neural foraminal stenosis. L1-L2: Normal disc space and facet joints. There is no spinal canal stenosis. No neural foraminal stenosis. L2-L3: Disc space edema with diffuse bulge and large osteophytes. Unchanged severe spinal canal stenosis. Unchanged severe bilateral neural foraminal stenosis. L3-L4: Diffuse disc bulge, slightly worsened. Slightly worsened severe spinal canal stenosis. No neural foraminal stenosis. L4-L5: Normal disc space and facet joints. Severe disc space narrowing with intermediate sized disc osteophyte complex and severe facet arthrosis. Unchanged severe  spinal canal stenosis. Unchanged severe bilateral neural foraminal stenosis. L5-S1: Mild disc bulge and moderate facet arthrosis. There is no spinal canal stenosis. Unchanged mild left neural foraminal stenosis. Visualized sacrum: Normal. IMPRESSION: 1. Mild progression of endplate remodeling at G3-1 with slightly increased disc space edema. No edema within the endplates. 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 Correlation with CRP might be helpful. 2. Unchanged severe lumbar spinal canal stenosis  and severe bilateral neural foraminal stenosis at L2-3 and L4-5 with marked mass effect on the cauda equina nerve roots at these levels. 3. 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 Electronically Signed   By: Ulyses Jarred M.D.   On: 03/23/2020 01:51    Pending Labs Unresulted Labs (From admission, onward)    Start     Ordered   03/23/20 1149  Phosphorus  Once,   STAT     03/23/20 1148          Vitals/Pain Today's Vitals   03/23/20 1044 03/23/20 1119 03/23/20 1131 03/23/20 1200  BP:  (!) 159/83  (!) (P) 167/91  Pulse:  83 87 (P) 76  Resp:  16 17 (P) 14  Temp:    (!) (P) 97.4 F (36.3 C)  TempSrc:    (P) Oral  SpO2:  92% 92%   Weight:      Height:      PainSc: Asleep       Isolation Precautions No active isolations  Medications Medications  patiromer (VELTASSA) packet 8.4 g (0 g Oral Hold 03/23/20 1012)  senna-docusate (Senokot-S) tablet 1 tablet (has no administration in time range)  ondansetron (ZOFRAN) tablet 4 mg (has no administration in time range)    Or  ondansetron (ZOFRAN) injection 4 mg (has no administration in time range)  HYDROcodone-acetaminophen (NORCO/VICODIN) 5-325 MG per tablet 1-2 tablet (1 tablet Oral Given 03/23/20 0952)  morphine 2 MG/ML injection 2 mg (has no administration in time range)   apixaban (ELIQUIS) tablet 5 mg (5 mg Oral Given 03/23/20 0857)  amLODipine (NORVASC) tablet 10 mg (10 mg Oral Not Given 03/23/20 0935)  ramipril (ALTACE) capsule 10 mg (10 mg Oral Given 03/23/20 0952)  labetalol (NORMODYNE) injection 10 mg (10 mg Intravenous Given 03/23/20 0857)  hydrALAZINE (APRESOLINE) tablet 50 mg (0 mg Oral Hold 03/23/20 1044)  acetaminophen (TYLENOL) tablet 1,000 mg (0 mg Oral Hold 03/23/20 1018)  HYDROmorphone (DILAUDID) injection 1 mg (1 mg Intravenous Given 03/22/20 1754)  HYDROmorphone (DILAUDID) injection 0.5 mg (0.5 mg Intravenous Given 03/23/20 0014)  dexamethasone (DECADRON) injection 10 mg (10 mg Intravenous Given 03/23/20 0336)  ceFAZolin (ANCEF) IVPB 1 g/50 mL premix (0 g Intravenous Stopped 03/23/20 0410)    Mobility non-ambulatory High fall risk   Focused Assessments    R Recommendations: See Admitting Provider Note  Report given to:

## 2020-03-23 NOTE — Progress Notes (Signed)
HD treatment started

## 2020-03-23 NOTE — Progress Notes (Signed)
PT Cancellation Note  Patient Details Name: Brian Fleming MRN: [id number] DOB: 1946/03/23   Cancelled Treatment:    Reason Eval/Treat Not Completed: Other (comment). Consult received and chart reviewed. Patient noted with critically K+ (5.7) on 3/31, awaiting new labs. Contraindicated for exertional activity at this time. Per chart pt also to be admitted to floor.  Will continue efforts next date pending medical stability and appropriateness.  Lieutenant Diego PT, DPT 10:12 AM,03/23/20

## 2020-03-23 NOTE — ED Provider Notes (Signed)
I assumed care of the patient from Dr. Jimmye Norman at 11:00 PM with recommendation to follow-up on MRI results.  MRI thoracic and lumbar spine revealed: CLINICAL DATA:  Back pain  EXAM: MRI THORACIC AND LUMBAR SPINE WITHOUT CONTRAST  TECHNIQUE: Multiplanar and multiecho pulse sequences of the thoracic and lumbar spine were obtained without intravenous contrast.  COMPARISON:  Thoracolumbar spine MRI 07/11/2019  FINDINGS: MRI THORACIC SPINE FINDINGS  Alignment:  Physiologic.  Vertebrae: Endplate remodeling at M4-68 is unchanged compared to 07/11/2019. There is multifocal bone marrow heterogeneity, also unchanged. No acute abnormality.  Cord: Assessment of the spinal cord is degraded by motion artifact. There may be a small area of hyperintense T2-weighted signal within the spinal cord at T9-10.  Paraspinal and other soft tissues: Negative  Disc levels:  T9-10: Diffuse disc bulge with narrowing of the ventral thecal sac. No spinal canal stenosis.  Other disc levels are unremarkable.  MRI LUMBAR SPINE FINDINGS  Segmentation:  Normal  Alignment: Grade 1 retrolisthesis at L3-4 and grade 1 anterolisthesis at L4-5  Vertebrae: Heterogeneous bone marrow signal with type 2 Modic changes at L4-5. There is edema within the L2-3 disc space. No adjacent endplate edema. There is low T1-weighted signal throughout most of the lumbar spine.  Conus medullaris and cauda equina: Conus extends to the L1-2 level. Conus and cauda equina appear normal.  Paraspinal and other soft tissues: Atrophy of the psoas musculature. Diffuse cystic change of both kidneys.  Disc levels:  T12-L1: Normal disc space and facet joints. There is no spinal canal stenosis. No neural foraminal stenosis.  L1-L2: Normal disc space and facet joints. There is no spinal canal stenosis. No neural foraminal stenosis.  L2-L3: Disc space edema with diffuse bulge and large osteophytes. Unchanged  severe spinal canal stenosis. Unchanged severe bilateral neural foraminal stenosis.  L3-L4: Diffuse disc bulge, slightly worsened. Slightly worsened severe spinal canal stenosis. No neural foraminal stenosis.  L4-L5: Normal disc space and facet joints. Severe disc space narrowing with intermediate sized disc osteophyte complex and severe facet arthrosis. Unchanged severe spinal canal stenosis. Unchanged severe bilateral neural foraminal stenosis.  L5-S1: Mild disc bulge and moderate facet arthrosis. There is no spinal canal stenosis. Unchanged mild left neural foraminal stenosis.  Visualized sacrum: Normal.  IMPRESSION: 1. Mild progression of endplate remodeling at E3-2 with slightly increased disc space edema. No edema within the endplates. 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 Correlation with CRP might be helpful. 2. Unchanged severe lumbar spinal canal stenosis and severe bilateral neural foraminal stenosis at L2-3 and L4-5 with marked mass effect on the cauda equina nerve roots at these levels. 3. 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   Electronically Signed   By: Ulyses Jarred M.D.   On: 03/23/2020 01:51   Patient given Decadron 10 mg IV based on MRI findings in the lumbar spine.  Regarding the possibility of discitis versus osteomyelitis patient CRP is indeed elevated at 19 with a sed rate of 86.  As such patient was given cefazolin.  Patient discussed with Dr. Damita Dunnings for hospital admission for further evaluation and management   Gregor Hams, MD 03/23/20 [phone]

## 2020-03-23 NOTE — ED Notes (Signed)
Pt checked and dry at this time. 

## 2020-03-23 NOTE — Progress Notes (Signed)
PROGRESS NOTE    [name]  ZOX:[id number] DOB: [birth year] DOA: 03/22/2020 PCP: [facility]    Assessment & Plan:   Principal Problem:   Intractable back pain Active Problems:   Essential hypertension   End stage renal disease on dialysis ([place])   Hyperkalemia   Atrial flutter (HCC)   Back pain    Brian Fleming is a 74 y.o. male with medical history significant for hypertension, A. fib on Eliquis, ESRD on HD MWF, with a history of chronic back pain, hospitalized in February 2021 for intractable back pain who returns to the emergency room with a complaint of back pain that has been worse over the past 3 days.  Denies weakness in the extremities or in the groin area, denies bladder or bowel problems.     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   -Nephrology consult for continuation of dialysis, received dialysis today -Continue sevelamer    Atrial flutter (HCC) -Currently rate controlled.  Continue apixaban   DVT prophylaxis: WJ:[id number] Code Status: Full code  Family Communication:  Disposition Plan: Home likely tomorrow   Subjective and Interval History:  Pt complained of back pain, and pain at the back of his thighs.  Said that "patch" helped in the past, but he ran out.  Also wanted me to prescribe pain pills for home use.  No fever, dyspnea, N/V/D.   Objective: Vitals:   03/23/20 1445 03/23/20 1500 03/23/20 1502 03/23/20 1941  BP: (!) 172/89 (!) 181/101 (!) 187/71 (!) 168/112  Pulse: 91 (!) 50 90  76  Resp: 16 17 12 20   Temp:      TempSrc:      SpO2: 98% 99%  100%  Weight:      Height:        Intake/Output Summary (Last 24 hours) at 03/23/2020 2028 Last data filed at 03/23/2020 0410 Gross per 24 hour  Intake 50 ml  Output --  Net 50 ml   Filed Weights   03/22/20 1609 03/23/20 0727  Weight: 91 kg 91 kg    Examination:   Constitutional: NAD, AAOx3 HEENT: conjunctivae and lids normal, EOMI CV: RRR no M,R,G. Distal pulses +2.  No cyanosis.   RESP: CTA B/L, normal respiratory effort  GI: +BS, NTND Extremities: No effusions, edema, in BLE SKIN: warm, dry and intact Neuro: II - XII grossly intact.  Sensation intact   Data Reviewed: I have personally reviewed following labs and imaging studies  CBC: Recent Labs  Lab 03/22/20 1752  WBC 8.1  NEUTROABS 6.3  HGB 11.0*  HCT 33.6*  MCV 95.7  PLT 956   Basic Metabolic Panel: Recent Labs  Lab 03/22/20 1752 03/23/20 1200  NA 137  --   K 5.7*  --   CL 92*  --   CO2 24  --   GLUCOSE 75  --   BUN 81*  --   CREATININE 13.32*  --   CALCIUM 8.4*  --   PHOS  --  10.0*   GFR: Estimated Creatinine Clearance: 5.6 mL/min (A) (by C-G formula based on SCr of 13.[place]  mg/dL (H)). Liver Function Tests: Recent Labs  Lab 03/22/20 1752  AST 12*  ALT 13  ALKPHOS 62  BILITOT 1.0  PROT 6.8  ALBUMIN 3.1*   No results for input(s): LIPASE, AMYLASE in the last 168 hours. No results for input(s): AMMONIA in the last 168 hours. Coagulation Profile: No results for input(s): INR, PROTIME in the last 168 hours. Cardiac Enzymes: No results for input(s): CKTOTAL, CKMB, CKMBINDEX, TROPONINI in the last 168 hours. BNP (last 3 results) No results for input(s): PROBNP in the last 8760 hours. HbA1C: No results for input(s): HGBA1C in the last 72 hours. CBG: No results for input(s): GLUCAP in the last 168 hours. Lipid Profile: No results for input(s): CHOL, HDL, LDLCALC, TRIG, CHOLHDL, LDLDIRECT in the last 72 hours. Thyroid  Function Tests: No results for input(s): TSH, T4TOTAL, FREET4, T3FREE, THYROIDAB in the last 72 hours. Anemia Panel: No results for input(s): VITAMINB12, FOLATE, FERRITIN, TIBC, IRON, RETICCTPCT in the last 72 hours. Sepsis Labs: No results for input(s): PROCALCITON, LATICACIDVEN in the last 168 hours.  No results found for this or any previous visit (from the past 240 hour(s)).    Radiology Studies: MR THORACIC SPINE WO CONTRAST  Result Date: 03/23/2020 CLINICAL DATA:  Back pain EXAM: MRI THORACIC AND LUMBAR SPINE WITHOUT CONTRAST TECHNIQUE: Multiplanar and multiecho pulse sequences of the thoracic and lumbar spine were obtained without intravenous contrast. COMPARISON:  Thoracolumbar spine MRI 07/11/2019 FINDINGS: MRI THORACIC SPINE FINDINGS Alignment:  Physiologic. Vertebrae: Endplate remodeling at I3-38 is unchanged compared to 07/11/2019. There is multifocal bone marrow heterogeneity, also unchanged. No acute abnormality. Cord: Assessment of the spinal cord is degraded by motion artifact. There may be a small area of hyperintense T2-weighted signal within the spinal cord at T9-10. Paraspinal and other soft tissues: Negative Disc levels: T9-10: Diffuse disc bulge with narrowing of the ventral thecal sac. No spinal canal stenosis. Other disc levels are unremarkable. MRI LUMBAR SPINE FINDINGS Segmentation:  Normal Alignment: Grade 1 retrolisthesis at L3-4 and grade 1 anterolisthesis at L4-5 Vertebrae: Heterogeneous bone marrow signal with type 2 Modic changes at L4-5. There is edema within the L2-3 disc space. No adjacent endplate edema. There is low T1-weighted signal throughout most of the lumbar spine. Conus medullaris and cauda equina: Conus extends to the L1-2 level. Conus and cauda equina appear normal. Paraspinal and other soft tissues: Atrophy of the psoas musculature. Diffuse cystic change of both kidneys. Disc levels: T12-L1: Normal disc space and facet joints. There is no spinal canal  stenosis. No neural foraminal stenosis. L1-L2: Normal disc space and facet joints. There is no spinal canal stenosis. No neural foraminal stenosis. L2-L3: Disc space edema with diffuse bulge and large osteophytes. Unchanged severe spinal canal stenosis. Unchanged severe bilateral neural foraminal stenosis. L3-L4: Diffuse disc bulge, slightly worsened. Slightly worsened severe spinal canal stenosis. No neural foraminal stenosis. L4-L5: Normal disc space and facet joints. Severe disc space narrowing with intermediate sized disc osteophyte complex and severe facet arthrosis. Unchanged severe spinal canal stenosis. Unchanged severe bilateral neural foraminal stenosis. L5-S1: Mild disc bulge and moderate facet arthrosis. There is no spinal canal stenosis. Unchanged mild left neural foraminal stenosis. Visualized sacrum: Normal. IMPRESSION: 1. Mild progression of endplate remodeling at S5-0 with slightly increased disc space edema. No edema within the endplates. 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 Correlation with CRP might be helpful. 2. Unchanged severe lumbar spinal canal stenosis and severe bilateral neural foraminal stenosis  at L2-3 and L4-5 with marked mass effect on the cauda equina nerve roots at these levels. 3. 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 Electronically Signed   By: Ulyses Jarred M.D.   On: 03/23/2020 01:51   MR LUMBAR SPINE WO CONTRAST  Result Date: 03/23/2020 CLINICAL DATA:  Back pain EXAM: MRI THORACIC AND LUMBAR SPINE WITHOUT CONTRAST TECHNIQUE: Multiplanar and multiecho pulse sequences of the thoracic and lumbar spine were obtained without intravenous contrast. COMPARISON:  Thoracolumbar spine MRI 07/11/2019 FINDINGS: MRI THORACIC SPINE FINDINGS Alignment:  Physiologic. Vertebrae: Endplate remodeling at Y1-01 is  unchanged compared to 07/11/2019. There is multifocal bone marrow heterogeneity, also unchanged. No acute abnormality. Cord: Assessment of the spinal cord is degraded by motion artifact. There may be a small area of hyperintense T2-weighted signal within the spinal cord at T9-10. Paraspinal and other soft tissues: Negative Disc levels: T9-10: Diffuse disc bulge with narrowing of the ventral thecal sac. No spinal canal stenosis. Other disc levels are unremarkable. MRI LUMBAR SPINE FINDINGS Segmentation:  Normal Alignment: Grade 1 retrolisthesis at L3-4 and grade 1 anterolisthesis at L4-5 Vertebrae: Heterogeneous bone marrow signal with type 2 Modic changes at L4-5. There is edema within the L2-3 disc space. No adjacent endplate edema. There is low T1-weighted signal throughout most of the lumbar spine. Conus medullaris and cauda equina: Conus extends to the L1-2 level. Conus and cauda equina appear normal. Paraspinal and other soft tissues: Atrophy of the psoas musculature. Diffuse cystic change of both kidneys. Disc levels: T12-L1: Normal disc space and facet joints. There is no spinal canal stenosis. No neural foraminal stenosis. L1-L2: Normal disc space and facet joints. There is no spinal canal stenosis. No neural foraminal stenosis. L2-L3: Disc space edema with diffuse bulge and large osteophytes. Unchanged severe spinal canal stenosis. Unchanged severe bilateral neural foraminal stenosis. L3-L4: Diffuse disc bulge, slightly worsened. Slightly worsened severe spinal canal stenosis. No neural foraminal stenosis. L4-L5: Normal disc space and facet joints. Severe disc space narrowing with intermediate sized disc osteophyte complex and severe facet arthrosis. Unchanged severe spinal canal stenosis. Unchanged severe bilateral neural foraminal stenosis. L5-S1: Mild disc bulge and moderate facet arthrosis. There is no spinal canal stenosis. Unchanged mild left neural foraminal stenosis. Visualized sacrum: Normal.  IMPRESSION: 1. Mild progression of endplate remodeling at B5-1 with slightly increased disc space edema. No edema within the endplates. 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 Correlation with CRP might be helpful. 2. Unchanged severe lumbar spinal canal stenosis and severe bilateral neural foraminal stenosis at L2-3 and L4-5 with marked mass effect on the cauda equina nerve roots at these levels. 3. 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 Electronically Signed   By: Ulyses Jarred M.D.   On: 03/23/2020 01:51     Scheduled Meds: . acetaminophen  1,000 mg Oral TID  . amLODipine  10 mg Oral QHS  . apixaban  5 mg Oral BID  . hydrALAZINE  50 mg Oral Q8H  . lidocaine  2 patch Transdermal Q12H  . patiromer  8.4 g Oral Daily  . ramipril  10 mg Oral Daily   Continuous Infusions:   LOS: 0 days     Enzo Bi, MD [facility] If 7PM-7AM, please contact night-coverage 03/23/2020, 8:28 PM

## 2020-03-23 NOTE — H&P (Signed)
History and Physical    Brian Fleming WNU:[id number] DOB: 03/27/1946 DOA: 03/22/2020  [facility]P: [facility]   Patient coming from: home  I have personally briefly reviewed patient's old medical records in [place]  Chief Complaint: back pain  HPI: Brian Fleming is a 74 y.o. male with medical history significant for hypertension, A. fib on Eliquis, ESRD on HD MWF, with a history of chronic back pain, hospitalized in February 2021 for intractable back pain who returns to the emergency room with a complaint of back pain that has been worse over the past 3 days.  Denies weakness in the extremities or in the groin area, denies bladder or bowel problems  ED Course: In the emergency room vitals were within normal limits blood work as expected for dialysis patient though potassium slightly elevated at 5.7.  MRI lumbar thoracic spine showed findings that remain suggestive of hemodialysis associated spondyloarthropathy though chronic discitis-osteomyelitis would be difficult to exclude.  Correlation with CRP might be helpful.  CRP was done and was elevated at 19.  Sed rate also elevated at 86  Review of Systems: Difficult to get as patient keeps groaning in pain.  Past Medical History:  Diagnosis Date  . Atrial flutter ([place])    a. dates back to at least 2013; b. CHADS2VASc 5 (HTN, age x 1, stroke x 2, vascular disease); c. started on Eliquis 06/2019  . DJD (degenerative joint disease)   . End stage renal disease on dialysis ([place])   . Hyperlipidemia   . Hypertension     Past Surgical History:  Procedure Laterality Date  . AV FISTULA PLACEMENT    . BACK SURGERY       reports that he has been smoking cigarettes. He has been smoking about 0.25 packs per day. He has never used smokeless tobacco. He reports that he does not drink alcohol or use drugs.  No Known Allergies  Family History  Problem Relation Age of Onset  . Hypertension Brother      Prior to  Admission medications   Medication Sig Start Date End Date Taking? Authorizing Provider  gabapentin (NEURONTIN) 300 MG capsule Take 300 mg by mouth daily.    Yes [provider]  HYDROcodone-acetaminophen (NORCO/VICODIN) 5-325 MG tablet Take 1 tablet by mouth every 4 (four) hours as needed for moderate pain. 03/16/20  Yes Cuthriell, Charline Bills, PA-C  rosuvastatin (CRESTOR) 10 MG tablet Take 10 mg by mouth at bedtime. 01/12/20  Yes [provider]  sevelamer carbonate (RENVELA) 800 MG tablet Take 2,400 mg by mouth 3 (three) times daily. 06/08/18  Yes [provider]  amLODipine (NORVASC) 10 MG tablet Take 10 mg by mouth at bedtime. 04/29/14   [provider]  apixaban (ELIQUIS) 5 MG TABS tablet Take 1 tablet (5 mg total) by mouth 2 (two) times daily. 07/14/19   Vaughan Basta, MD  B Complex-C-Folic Acid (RENA-VITE PO) Take 1 tablet by mouth daily. 10/27/14   [provider]  docusate sodium (COLACE) 100 MG capsule Take 1 capsule (100 mg total) by mouth daily as needed. 03/07/20 03/07/21  Earleen Newport, MD  lidocaine (LIDODERM) 5 % Place 1 patch onto the skin every 12 (twelve) hours. Remove & Discard patch within 12 hours or as directed by MD 02/10/20 02/09/21  Blake Divine, MD  ramipril (ALTACE) 10 MG capsule Take 10 mg by mouth daily. 09/09/18   [provider]  traMADol (ULTRAM) 50 MG tablet Take 1 tablet (50 mg total)  by mouth every 12 (twelve) hours as needed for severe pain (for pain no relieved by tylenol). 03/07/20 03/07/21  Earleen Newport, MD    Physical Exam: Vitals:   03/22/20 1900 03/22/20 1915 03/22/20 1930 03/22/20 1945  BP:      Pulse: (!) 102 99 95 (!) 102  Resp:      Temp:      TempSrc:      SpO2: 92% 91% 93% 97%  Weight:      Height:         Vitals:   03/22/20 1900 03/22/20 1915 03/22/20 1930 03/22/20 1945  BP:      Pulse: (!) 102 99 95 (!) 102  Resp:      Temp:      TempSrc:      SpO2: 92% 91% 93% 97%   Weight:      Height:        Constitutional: somewhat drowsy, oriented x2, in pain discomfort when awakened Eyes: PERLA, EOMI, irises appear normal, anicteric sclera,  ENMT: external ears and nose appear normal, normal hearing             Lips appears normal, oropharynx mucosa, tongue, posterior pharynx appear normal  Neck: neck appears normal, no masses, normal ROM, no thyromegaly, no JVD  CVS: S1-S2 clear, no murmur rubs or gallops,  , no carotid bruits, pedal pulses palpable, No LE edema Respiratory:  clear to auscultation bilaterally, no wheezing, rales or rhonchi. Respiratory effort normal. No accessory muscle use.  Abdomen: soft nontender, nondistended, normal bowel sounds, no hepatosplenomegaly, no hernias Musculoskeletal: : no cyanosis, clubbing , no contractures or atrophy Neuro: Cranial nerves II-XII intact, sensation, reflexes normal, strength Psych: judgement and insight appear normal, stable mood and affect,  Skin: no rashes or lesions or ulcers, no induration or nodules   Labs on Admission: I have personally reviewed following labs and imaging studies  CBC: Recent Labs  Lab 03/16/20 1650 03/22/20 1752  WBC 7.7 8.1  NEUTROABS 5.5 6.3  HGB 12.2* 11.0*  HCT 36.8* 33.6*  MCV 94.8 95.7  PLT 178 810   Basic Metabolic Panel: Recent Labs  Lab 03/16/20 1650 03/22/20 1752  NA 139 137  K 5.7* 5.7*  CL 92* 92*  CO2 27 24  GLUCOSE 107* 75  BUN 99* 81*  CREATININE 12.56* 13.32*  CALCIUM 8.7* 8.4*   GFR: Estimated Creatinine Clearance: 5.6 mL/min (A) (by C-G formula based on SCr of 13.32 mg/dL (H)). Liver Function Tests: Recent Labs  Lab 03/16/20 1650 03/22/20 1752  AST 10* 12*  ALT 11 13  ALKPHOS 61 62  BILITOT 1.0 1.0  PROT 6.8 6.8  ALBUMIN 3.3* 3.1*   No results for input(s): LIPASE, AMYLASE in the last 168 hours. No results for input(s): AMMONIA in the last 168 hours. Coagulation Profile: No results for input(s): INR, PROTIME in the last 168  hours. Cardiac Enzymes: No results for input(s): CKTOTAL, CKMB, CKMBINDEX, TROPONINI in the last 168 hours. BNP (last 3 results) No results for input(s): PROBNP in the last 8760 hours. HbA1C: No results for input(s): HGBA1C in the last 72 hours. CBG: No results for input(s): GLUCAP in the last 168 hours. Lipid Profile: No results for input(s): CHOL, HDL, LDLCALC, TRIG, CHOLHDL, LDLDIRECT in the last 72 hours. Thyroid Function Tests: No results for input(s): TSH, T4TOTAL, FREET4, T3FREE, THYROIDAB in the last 72 hours. Anemia Panel: No results for input(s): VITAMINB12, FOLATE, FERRITIN, TIBC, IRON, RETICCTPCT in the last 72 hours. Urine  analysis: No results found for: COLORURINE, APPEARANCEUR, LABSPEC, PHURINE, GLUCOSEU, HGBUR, BILIRUBINUR, KETONESUR, PROTEINUR, UROBILINOGEN, NITRITE, LEUKOCYTESUR  Radiological Exams on Admission: MR THORACIC SPINE WO CONTRAST  Result Date: 03/23/2020 CLINICAL DATA:  Back pain EXAM: MRI THORACIC AND LUMBAR SPINE WITHOUT CONTRAST TECHNIQUE: Multiplanar and multiecho pulse sequences of the thoracic and lumbar spine were obtained without intravenous contrast. COMPARISON:  Thoracolumbar spine MRI 07/11/2019 FINDINGS: MRI THORACIC SPINE FINDINGS Alignment:  Physiologic. Vertebrae: Endplate remodeling at C1-44 is unchanged compared to 07/11/2019. There is multifocal bone marrow heterogeneity, also unchanged. No acute abnormality. Cord: Assessment of the spinal cord is degraded by motion artifact. There may be a small area of hyperintense T2-weighted signal within the spinal cord at T9-10. Paraspinal and other soft tissues: Negative Disc levels: T9-10: Diffuse disc bulge with narrowing of the ventral thecal sac. No spinal canal stenosis. Other disc levels are unremarkable. MRI LUMBAR SPINE FINDINGS Segmentation:  Normal Alignment: Grade 1 retrolisthesis at L3-4 and grade 1 anterolisthesis at L4-5 Vertebrae: Heterogeneous bone marrow signal with type 2 Modic changes at  L4-5. There is edema within the L2-3 disc space. No adjacent endplate edema. There is low T1-weighted signal throughout most of the lumbar spine. Conus medullaris and cauda equina: Conus extends to the L1-2 level. Conus and cauda equina appear normal. Paraspinal and other soft tissues: Atrophy of the psoas musculature. Diffuse cystic change of both kidneys. Disc levels: T12-L1: Normal disc space and facet joints. There is no spinal canal stenosis. No neural foraminal stenosis. L1-L2: Normal disc space and facet joints. There is no spinal canal stenosis. No neural foraminal stenosis. L2-L3: Disc space edema with diffuse bulge and large osteophytes. Unchanged severe spinal canal stenosis. Unchanged severe bilateral neural foraminal stenosis. L3-L4: Diffuse disc bulge, slightly worsened. Slightly worsened severe spinal canal stenosis. No neural foraminal stenosis. L4-L5: Normal disc space and facet joints. Severe disc space narrowing with intermediate sized disc osteophyte complex and severe facet arthrosis. Unchanged severe spinal canal stenosis. Unchanged severe bilateral neural foraminal stenosis. L5-S1: Mild disc bulge and moderate facet arthrosis. There is no spinal canal stenosis. Unchanged mild left neural foraminal stenosis. Visualized sacrum: Normal. IMPRESSION: 1. Mild progression of endplate remodeling at Y1-8 with slightly increased disc space edema. No edema within the endplates. 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 Correlation with CRP might be helpful. 2. Unchanged severe lumbar spinal canal stenosis and severe bilateral neural foraminal stenosis at L2-3 and L4-5 with marked mass effect on the cauda equina nerve roots at these levels. 3. 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 Electronically Signed   By: Ulyses Jarred M.D.   On:  03/23/2020 01:51   MR LUMBAR SPINE WO CONTRAST  Result Date: 03/23/2020 CLINICAL DATA:  Back pain EXAM: MRI THORACIC AND LUMBAR SPINE WITHOUT CONTRAST TECHNIQUE: Multiplanar and multiecho pulse sequences of the thoracic and lumbar spine were obtained without intravenous contrast. COMPARISON:  Thoracolumbar spine MRI 07/11/2019 FINDINGS: MRI THORACIC SPINE FINDINGS Alignment:  Physiologic. Vertebrae: Endplate remodeling at S9-70 is unchanged compared to 07/11/2019. There is multifocal bone marrow heterogeneity, also unchanged. No acute abnormality. Cord: Assessment of the spinal cord is degraded by motion artifact. There may be a small area of hyperintense T2-weighted signal within the spinal cord at T9-10. Paraspinal and other soft tissues: Negative Disc levels: T9-10: Diffuse disc bulge with narrowing of the ventral thecal sac. No spinal canal stenosis. Other disc levels are unremarkable. MRI LUMBAR SPINE  FINDINGS Segmentation:  Normal Alignment: Grade 1 retrolisthesis at L3-4 and grade 1 anterolisthesis at L4-5 Vertebrae: Heterogeneous bone marrow signal with type 2 Modic changes at L4-5. There is edema within the L2-3 disc space. No adjacent endplate edema. There is low T1-weighted signal throughout most of the lumbar spine. Conus medullaris and cauda equina: Conus extends to the L1-2 level. Conus and cauda equina appear normal. Paraspinal and other soft tissues: Atrophy of the psoas musculature. Diffuse cystic change of both kidneys. Disc levels: T12-L1: Normal disc space and facet joints. There is no spinal canal stenosis. No neural foraminal stenosis. L1-L2: Normal disc space and facet joints. There is no spinal canal stenosis. No neural foraminal stenosis. L2-L3: Disc space edema with diffuse bulge and large osteophytes. Unchanged severe spinal canal stenosis. Unchanged severe bilateral neural foraminal stenosis. L3-L4: Diffuse disc bulge, slightly worsened. Slightly worsened severe spinal canal stenosis.  No neural foraminal stenosis. L4-L5: Normal disc space and facet joints. Severe disc space narrowing with intermediate sized disc osteophyte complex and severe facet arthrosis. Unchanged severe spinal canal stenosis. Unchanged severe bilateral neural foraminal stenosis. L5-S1: Mild disc bulge and moderate facet arthrosis. There is no spinal canal stenosis. Unchanged mild left neural foraminal stenosis. Visualized sacrum: Normal. IMPRESSION: 1. Mild progression of endplate remodeling at B0-4 with slightly increased disc space edema. No edema within the endplates. 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 Correlation with CRP might be helpful. 2. Unchanged severe lumbar spinal canal stenosis and severe bilateral neural foraminal stenosis at L2-3 and L4-5 with marked mass effect on the cauda equina nerve roots at these levels. 3. 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 Electronically Signed   By: Ulyses Jarred M.D.   On: 03/23/2020 01:51    EKG: Independently reviewed.   Assessment/Plan    Intractable back pain -MRI shows findings remain suggestive of hemodialysis associated spondyloarthropathy,though chronic discitis-osteomyelitis would be difficult to exclude -CRP and sed rate elevated -Oral pain relief with IV for breakthrough -Consider ID or neurosurgery consult in the a.m.    Hyperkalemia -Patient received a dose of V[place] in the ER.  Continue to monitor    Essential hypertension -Continue home meds of amlodipine and ramipril    End stage renal disease on dialysis MWF(HCC) -Nephrology consult for continuation of dialysis -Continue sevelamer    Atrial flutter (HCC) -Currently rate controlled.  Continue apixaban    DVT prophylaxis:apixaban from home Code Status: full code  Family Communication:  none  Disposition  Plan: Back to previous home environment Consults called: Dr. Holley Raring nephrology Status:obs    Athena Masse MD [facility]     03/23/2020, 3:26 AM

## 2020-03-23 NOTE — Progress Notes (Signed)
Post HD  

## 2020-03-23 NOTE — Progress Notes (Signed)
Tx ended    

## 2020-03-23 NOTE — ED Notes (Signed)
Pt given breakfast tray at this time. 

## 2020-03-24 ENCOUNTER — Inpatient Hospital Stay: Payer: Medicare Other

## 2020-03-24 DIAGNOSIS — Z7901 Long term (current) use of anticoagulants: Secondary | ICD-10-CM

## 2020-03-24 DIAGNOSIS — I4892 Unspecified atrial flutter: Secondary | ICD-10-CM

## 2020-03-24 DIAGNOSIS — M4626 Osteomyelitis of vertebra, lumbar region: Principal | ICD-10-CM

## 2020-03-24 DIAGNOSIS — I120 Hypertensive chronic kidney disease with stage 5 chronic kidney disease or end stage renal disease: Secondary | ICD-10-CM

## 2020-03-24 DIAGNOSIS — M48061 Spinal stenosis, lumbar region without neurogenic claudication: Secondary | ICD-10-CM

## 2020-03-24 DIAGNOSIS — H9190 Unspecified hearing loss, unspecified ear: Secondary | ICD-10-CM

## 2020-03-24 DIAGNOSIS — Z992 Dependence on renal dialysis: Secondary | ICD-10-CM

## 2020-03-24 DIAGNOSIS — F17210 Nicotine dependence, cigarettes, uncomplicated: Secondary | ICD-10-CM

## 2020-03-24 DIAGNOSIS — Z79899 Other long term (current) drug therapy: Secondary | ICD-10-CM

## 2020-03-24 DIAGNOSIS — M4646 Discitis, unspecified, lumbar region: Secondary | ICD-10-CM

## 2020-03-24 DIAGNOSIS — N186 End stage renal disease: Secondary | ICD-10-CM

## 2020-03-24 LAB — CBC
HCT: 33.4 % — ABNORMAL LOW (ref 39.0–52.0)
Hemoglobin: 11.2 g/dL — ABNORMAL LOW (ref 13.0–17.0)
MCH: 31.4 pg (ref 26.0–34.0)
MCHC: 33.5 g/dL (ref 30.0–36.0)
MCV: 93.6 fL (ref 80.0–100.0)
Platelets: 195 10*3/uL (ref 150–400)
RBC: 3.57 MIL/uL — ABNORMAL LOW (ref 4.22–5.81)
RDW: 15.4 % (ref 11.5–15.5)
WBC: 7.4 10*3/uL (ref 4.0–10.5)
nRBC: 0 % (ref 0.0–0.2)

## 2020-03-24 LAB — BASIC METABOLIC PANEL
Anion gap: 19 — ABNORMAL HIGH (ref 5–15)
BUN: 61 mg/dL — ABNORMAL HIGH (ref 8–23)
CO2: 28 mmol/L (ref 22–32)
Calcium: 9 mg/dL (ref 8.9–10.3)
Chloride: 94 mmol/L — ABNORMAL LOW (ref 98–111)
Creatinine, Ser: 10.11 mg/dL — ABNORMAL HIGH (ref 0.61–1.24)
GFR calc Af Amer: 5 mL/min — ABNORMAL LOW (ref >60)
GFR calc non Af Amer: 5 mL/min — ABNORMAL LOW (ref >60)
Glucose, Bld: 121 mg/dL — ABNORMAL HIGH (ref 70–99)
Potassium: 5 mmol/L (ref 3.5–5.1)
Sodium: 141 mmol/L (ref 135–145)

## 2020-03-24 LAB — GLUCOSE, CAPILLARY: Glucose-Capillary: 95 mg/dL (ref 70–99)

## 2020-03-24 LAB — SARS CORONAVIRUS 2 (TAT 6-24 HRS): SARS Coronavirus 2: NEGATIVE

## 2020-03-24 LAB — PHOSPHORUS: Phosphorus: 7.1 mg/dL — ABNORMAL HIGH (ref 2.5–4.6)

## 2020-03-24 LAB — MAGNESIUM: Magnesium: 2.2 mg/dL (ref 1.7–2.4)

## 2020-03-24 MED ORDER — SEVELAMER CARBONATE 800 MG PO TABS
2400.0000 mg | ORAL_TABLET | Freq: Three times a day (TID) | ORAL | Status: DC
  Administered 2020-03-24 – 2020-04-04 (×19): 2400 mg via ORAL
  Filled 2020-03-24 (×25): qty 3

## 2020-03-24 MED ORDER — HYDROCODONE-ACETAMINOPHEN 5-325 MG PO TABS
1.0000 | ORAL_TABLET | Freq: Four times a day (QID) | ORAL | Status: DC | PRN
  Administered 2020-03-25: 1 via ORAL
  Filled 2020-03-24 (×2): qty 1

## 2020-03-24 NOTE — Progress Notes (Signed)
Patient resting quietly after medication for right leg pain.

## 2020-03-24 NOTE — Progress Notes (Signed)
This note also relates to the following rows which could not be included: Pulse Rate - Cannot attach notes to unvalidated device data Resp - Cannot attach notes to unvalidated device data  Hd started  

## 2020-03-24 NOTE — Progress Notes (Signed)
This note also relates to the following rows which could not be included: Pulse Rate - Cannot attach notes to unvalidated device data Resp - Cannot attach notes to unvalidated device data BP - Cannot attach notes to unvalidated device data  Hd completed, discontinued 30 mins early dt clotted venous line, dr Holley Raring notified.

## 2020-03-24 NOTE — TOC Initial Note (Signed)
Transition of Care [facility]) - Initial/Assessment Note    Patient Details  Name: Brian Fleming MRN: [id number] Date of Birth: [birth year]  [facility] CM/SW Contact:    Beverly Sessions, RN Phone Number: 03/24/2020, 10:09 AM  Clinical Narrative:                 Patient admitted from home Patient currently in HD  Patient has legal guardian through [place].  RNCM spoke with Elaina Hoops [phone]    Leanora Ivanoff states they received guardianship of patient on 3/30  Barrie Lyme states that currently patient lives in his own home.  Has an aide coming out 4 times a day for a total of 12 hours a day.  They have the option to provide 24 hour care if needed.  Per Barrie Lyme their goal is to place patient at a family care home at discharge.  She is arranging for 2 facilities to come and assess the patient   Barrie Lyme request covid test (which as already been completed), chest xray to r/o TB (MD notified), and Fl2 (will need to be completed after PT assessment).  PT eval pending     Expected Discharge Plan: Group Home     Patient Goals and CMS Choice        Expected Discharge Plan and Services Expected Discharge Plan: Group Home   Discharge Planning Services: CM Consult                                          Prior Living Arrangements/Services   Lives with:: Self          Need for Family Participation in Patient Care: Yes (Comment) Care giver support system in place?: Yes (comment) Current home services: [facility], Homehealth aide    Activities of Daily Living Home Assistive Devices/Equipment: Environmental consultant (specify type) ADL Screening (condition at time of admission) Patient's cognitive ability adequate to safely complete daily activities?: Yes Is the patient deaf or have difficulty hearing?: No Does the patient have difficulty seeing, even when wearing glasses/contacts?: No Does the patient have difficulty concentrating, remembering, or making  decisions?: Yes Patient able to express need for assistance with ADLs?: Yes Does the patient have difficulty dressing or bathing?: Yes Independently performs ADLs?: Yes (appropriate for developmental age) Does the patient have difficulty walking or climbing stairs?: Yes Weakness of Legs: Both Weakness of Arms/Hands: None  Permission Sought/Granted                  Emotional Assessment           Psych Involvement: No (comment)  Admission diagnosis:  Back pain [M54.9] Intractable back pain [M54.9] Acute midline low back pain without sciatica [M54.5] Patient Active Problem List   Diagnosis Date Noted  . Back pain 03/23/2020  . End stage renal disease on dialysis ([place])   . Hyperkalemia   . HLD (hyperlipidemia)   . GERD (gastroesophageal reflux disease)   . Atrial flutter ([place])   . Intractable back pain   . Tobacco abuse   . Fall 07/11/2019  . HCAP (healthcare-associated pneumonia) 01/26/2019  . Pneumonia 01/25/2019  . End stage renal disease ([place]) 12/04/2017  . Essential hypertension 12/04/2017  . Hyperlipidemia 12/04/2017  . Generalized weakness 04/11/2016  . CHF (congestive heart failure) ([place]) 08/20/2015   PCP:  [facility] [place] [place] -  [address] [address] Phone: [phone] Fax: [phone]  [facility] ([facility] Only) - [place], [address] [place] [address] Phone: [phone] Fax: [phone]     Social Determinants of Health ([place]) Interventions    Readmission Risk Interventions Readmission Risk Prevention Plan 03/24/2020 07/12/2019  Transportation Screening Complete Complete  PCP or Specialist Appt within 5-7 Days - Complete  Home Care Screening - Complete  Medication Review (RN CM) - Complete  Palliative Care Screening Not Applicable -  Medication Review (RN Care Manager) Complete -  Some recent data might  be hidden

## 2020-03-24 NOTE — Progress Notes (Signed)
PT Cancellation Note  Patient Details Name: Antwaun Buth MRN: [id number] DOB: November 24, 1946   Cancelled Treatment:    Reason Eval/Treat Not Completed: Medical issues which prohibited therapy(Chart reviewed, noted clotting complication at end of HD. Will hold PT at this time, resume services once medically appropriate at later date/time.)   2:29 PM, 03/24/20 Etta Grandchild, PT, DPT Physical Therapist - [facility]  [phone] ([place])   [place] 03/24/2020, 2:29 PM

## 2020-03-24 NOTE — Plan of Care (Signed)
Continuing with plan of care. 

## 2020-03-24 NOTE — Progress Notes (Signed)
Assumed care of patient last evening. Patient seems to be alert and oriented to place and self, and possibly situation. Patient complains of left knee pain and lower back pain at this time. Patient given PO pain medication as well as Lidocaine patches patched placed on his back. Patient did not have IV access when admitted and failed attempts made by attending nurse and I V team. Provider aware. No acute findings noted at this time safety checks completed and call light placed within reach. Will continue to monitor.

## 2020-03-24 NOTE — Consult Note (Signed)
NAME: Brian Fleming  DOB: [birth year]  MRN: [id number]  Date/Time: 03/24/2020 11:42 AM  REQUESTING PROVIDER: Dr.Lai Subjective:  REASON FOR CONSULT: back pain r/o chronic discitis ?pt is a poor historian Brian Fleming is a 74 y.o. male  with a history of ESRD , HTN, Atrial flutter who has had multiple trips to the ED and hospital  for back pain in the past few weeks presented to the ED again on 3/31 with back pain and was admitted. HE had MRI  Of the lumbar spine which showed Mild progression of endplate remodeling at Y6-9 with slightly increased disc space edema. No edema within the endplates. 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 Pt in July 2020 had MRI which showed Moderate diffuse disc bulge with severe spinal canal stenosis and severe bilateral neural foraminal stenosis at L2-L3. I am asked to see the patient as ESR and CRP are elevated  Pt denies any fever, weight loss His son lives with him- He walks with a walker Past Medical History:  Diagnosis Date  . Atrial flutter ([place])    a. dates back to at least 2013; b. CHADS2VASc 5 (HTN, age x 1, stroke x 2, vascular disease); c. started on Eliquis 06/2019  . DJD (degenerative joint disease)   . End stage renal disease on dialysis ([place])   . Hyperlipidemia   . Hypertension     Past Surgical History:  Procedure Laterality Date  . AV FISTULA PLACEMENT    . BACK SURGERY      Social History   Socioeconomic History  . Marital status: Widowed    Spouse name: Not on file  . Number of children: Not on file  . Years of education: Not on file  . Highest education level: Not on file  Occupational History  . Not on file  Tobacco Use  . Smoking status: Current Every Day Smoker    Packs/day: 0.25    Types: Cigarettes  . Smokeless tobacco: Never Used  Substance and Sexual Activity  . Alcohol use: No  . Drug use: No  . Sexual activity:  Not on file  Other Topics Concern  . Not on file  Social History Narrative  . Not on file   Social Determinants of Health   Financial Resource Strain:   . Difficulty of Paying Living Expenses:   Food Insecurity:   . Worried About Charity fundraiser in the Last Year:   . Arboriculturist in the Last Year:   Transportation Needs:   . Film/video editor (Medical):   Marland Kitchen Lack of Transportation (Non-Medical):   Physical Activity:   . Days of Exercise per Week:   . Minutes of Exercise per Session:   Stress:   . Feeling of Stress :   Social Connections:   . Frequency of Communication with Friends and Family:   . Frequency of Social Gatherings with Friends and Family:   . Attends Religious Services:   . Active Member of Clubs or Organizations:   . Attends Archivist Meetings:   Marland Kitchen Marital Status:   Intimate Partner Violence:   . Fear of Current or Ex-Partner:   . Emotionally Abused:   Marland Kitchen Physically Abused:   . Sexually Abused:     Family History  Problem Relation Age of Onset  . Hypertension Brother    No Known Allergies  ? Current Facility-Administered Medications  Medication Dose Route Frequency Provider Last Rate Last  Admin  . amLODipine (NORVASC) tablet 10 mg  10 mg Oral QHS Enzo Bi, MD      . apixaban Arne Cleveland) tablet 5 mg  5 mg Oral BID Athena Masse, MD   5 mg at 03/24/20 1133  . hydrALAZINE (APRESOLINE) tablet 50 mg  50 mg Oral Q8H Enzo Bi, MD   50 mg at 03/24/20 0555  . HYDROcodone-acetaminophen (NORCO/VICODIN) 5-325 MG per tablet 1-2 tablet  1-2 tablet Oral Q4H PRN Athena Masse, MD   2 tablet at 03/24/20 0047  . labetalol (NORMODYNE) injection 10 mg  10 mg Intravenous Q2H PRN Enzo Bi, MD   10 mg at 03/23/20 0857  . lidocaine (LIDODERM) 5 % 2 patch  2 patch Transdermal BID Enzo Bi, MD   2 patch at 03/24/20 0048  . morphine 2 MG/ML injection 2 mg  2 mg Intravenous Q2H PRN Athena Masse, MD   2 mg at 03/24/20 0942  . ondansetron (ZOFRAN)  tablet 4 mg  4 mg Oral Q6H PRN Athena Masse, MD       Or  . ondansetron [facility]) injection 4 mg  4 mg Intravenous Q6H PRN Athena Masse, MD      . patiromer Daryll Drown) packet 8.4 g  8.4 g Oral Daily Duffy Bruce, MD   Stopped at 03/23/20 1012  . ramipril (ALTACE) capsule 10 mg  10 mg Oral Daily Enzo Bi, MD   10 mg at 03/23/20 7622  . senna-docusate (Senokot-S) tablet 1 tablet  1 tablet Oral QHS PRN Athena Masse, MD         Abtx:  Anti-infectives (From admission, onward)   Start     Dose/Rate Route Frequency Ordered Stop   03/23/20 0330  ceFAZolin (ANCEF) IVPB 1 g/50 mL premix     1 g 100 mL/hr over 30 Minutes Intravenous  Once 03/23/20 0325 03/23/20 0410      REVIEW OF SYSTEMS:  Const: negative fever, negative chills, negative weight loss Eyes: negative diplopia or visual changes, negative eye pain ENT: negative coryza, negative sore throat Resp: negative cough, hemoptysis, dyspnea Cards: negative for chest pain, palpitations, lower extremity edema GU: negative for frequency, dysuria and hematuria GI: Negative for abdominal pain, diarrhea, bleeding, constipation Skin: negative for rash and pruritus Heme: negative for easy bruising and gum/nose bleeding MS: severe back pain Neurolo:negative for headaches, dizziness, vertigo, memory problems  Psych: negative for feelings of anxiety, depression  Endocrine: negative for thyroid, diabetes Allergy/Immunology- negative for any medication or food allergies ?  Objective:  VITALS:  BP (!) 155/83   Pulse 79   Temp 98 F (36.7 C) (Oral)   Resp 13   Ht '5\' 8"'$  (1.727 m)   Wt 91 kg   SpO2 96%   BMI 30.50 kg/m  PHYSICAL EXAM:  General: Alert, cooperative, no distress, chronically ill Hard of hearing Head: Normocephalic, without obvious abnormality, atraumatic. Eyes: Conjunctivae clear, anicteric sclerae. Pupils are equal ENT Nares normal. No drainage or sinus tenderness. Lips, mucosa, and tongue normal. No  Thrush Neck: , symmetrical, Back: some tenderness over the lumbar area- has patch Lungs: b/l air entry. Heart: irregular Abdomen: Soft, non-tender,not distended. Bowel sounds normal. No masses Extremities:both hands have deformity- ulnar deviation of fingers, swan neck deformity Left AV fistula Skin: No rashes or lesions. Or bruising Lymph: Cervical, supraclavicular normal. Neurologic: Grossly non-focal Pertinent Labs Lab Results CBC    Component Value Date/Time   WBC 7.4 03/24/2020 0457   RBC 3.57 (L)  03/24/2020 0457   HGB 11.2 (L) 03/24/2020 0457   HGB 12.4 (L) 02/17/2012 2149   HCT 33.4 (L) 03/24/2020 0457   HCT 37.4 (L) 02/17/2012 2149   PLT 195 03/24/2020 0457   PLT 71 (L) 02/17/2012 2149   MCV 93.6 03/24/2020 0457   MCV 95 02/17/2012 2149   MCH 31.4 03/24/2020 0457   MCHC 33.5 03/24/2020 0457   RDW 15.4 03/24/2020 0457   RDW 15.8 (H) 02/17/2012 2149   LYMPHSABS 0.7 03/22/2020 1752   MONOABS 0.9 03/22/2020 1752   EOSABS 0.1 03/22/2020 1752   BASOSABS 0.0 03/22/2020 1752    CMP Latest Ref Rng & Units 03/24/2020 03/22/2020 03/16/2020  Glucose 70 - 99 mg/dL 121(H) 75 107(H)  BUN 8 - 23 mg/dL 61(H) 81(H) 99(H)  Creatinine 0.61 - 1.24 mg/dL 10.11(H) 13.32(H) 12.56(H)  Sodium 135 - 145 mmol/L 141 137 139  Potassium 3.5 - 5.1 mmol/L 5.0 5.7(H) 5.7(H)  Chloride 98 - 111 mmol/L 94(L) 92(L) 92(L)  CO2 22 - 32 mmol/L '28 24 27  '$ Calcium 8.9 - 10.3 mg/dL 9.0 8.4(L) 8.7(L)  Total Protein 6.5 - 8.1 g/dL - 6.8 6.8  Total Bilirubin 0.3 - 1.2 mg/dL - 1.0 1.0  Alkaline Phos 38 - 126 U/L - 62 61  AST 15 - 41 U/L - 12(L) 10(L)  ALT 0 - 44 U/L - 13 11      Microbiology: Recent Results (from the past 240 hour(s))  SARS CORONAVIRUS 2 (TAT 6-24 HRS) Nasopharyngeal Nasopharyngeal Swab     Status: None   Collection Time: 03/23/20  8:03 PM   Specimen: Nasopharyngeal Swab  Result Value Ref Range Status   SARS Coronavirus 2 NEGATIVE NEGATIVE Final    Comment: (NOTE) SARS-CoV-2 target  nucleic acids are NOT DETECTED. The SARS-CoV-2 RNA is generally detectable in upper and lower respiratory specimens during the acute phase of infection. Negative results do not preclude SARS-CoV-2 infection, do not rule out co-infections with other pathogens, and should not be used as the sole basis for treatment or other patient management decisions. Negative results must be combined with clinical observations, patient history, and epidemiological information. The expected result is Negative. Fact Sheet for Patients: SugarRoll.be Fact Sheet for Healthcare Providers: https://www.woods-mathews.com/ This test is not yet approved or cleared by the [place] FDA and  has been authorized for detection and/or diagnosis of SARS-CoV-2 by FDA under an Emergency Use Authorization (EUA). This EUA will remain  in effect (meaning this test can be used) for the duration of the COVID-19 declaration under Section 56 4(b)(1) of the Act, 21 U.S.C. section 360bbb-3(b)(1), unless the authorization is terminated or revoked sooner. 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 Infection is  I have personally reviewed the films ? Impression/Recommendation ? 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 __HTN on ramipril, hydralazine, amlodipine_  Atrial flutter  on Eliquis  Anemia due to ESRD Discussed the management with Dr.LAi

## 2020-03-24 NOTE — Progress Notes (Signed)
PROGRESS NOTE    Brian Fleming  YBO:[id number] DOB: Apr 30, 1946 DOA: 03/22/2020 PCP: [facility]    Assessment & Plan:   Principal Problem:   Intractable back pain Active Problems:   Essential hypertension   End stage renal disease on dialysis ([place])   Hyperkalemia   Atrial flutter ([facility])   Back pain    Brian Fleming is a 74 y.o. male with medical history significant for hypertension, A. fib on Eliquis, ESRD on HD MWF, with a history of chronic back pain, hospitalized in February 2021 for intractable back pain who returns to the emergency room with a complaint of back pain that has been worse over the past 3 days.  Denies weakness in the extremities or in the groin area, denies bladder or bowel problems.     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    Essential hypertension -Continue home meds of amlodipine and ramipril    End stage renal disease on dialysis MWF([facility])   Hyperkalemia --pt missed dialysis 2/2 back pain -Patient received a dose of Veltassa in the ER.   -Nephrology consult for continuation of dialysis, received dialysis today -Continue sevelamer    Atrial flutter ([facility]) -Currently rate controlled.  Continue apixaban   DVT prophylaxis: ID:[id number] Code Status: Full code  Family Communication:  Disposition Plan: Discharge after Monday after IR biopsy and neurosurgery workup  of back pain.  Family wants pt to be placed into "family care home", TOC is working on placement.   Subjective and Interval History:  Pt complained of severe left leg pain (pointed to an area below the knee) during dialysis.  Reported back pain better.  No fever, dyspnea, N/V/D.  ID consult, who recommended IR biopsy.   Objective: Vitals:   03/24/20 1115 03/24/20 1130 03/24/20 1145 03/24/20 1200  BP: (!) 155/83  (!) 147/86 (!) 158/94  Pulse: 78 79 83 85  Resp: 13 13 14  (!) 23  Temp:   97.6 F (36.4 C)   TempSrc:   Oral   SpO2:      Weight:      Height:        Intake/Output Summary (Last 24 hours) at 03/24/2020 1553 Last data filed at 03/24/2020 1145 Gross per 24 hour  Intake 0 ml  Output 902 ml  Net -902 ml   Filed Weights   03/22/20 1609 03/23/20 0727 03/24/20 0920  Weight: 91 kg 91 kg 91 kg    Examination:   Constitutional: NAD, AAOx3 HEENT: conjunctivae and lids normal, EOMI CV: RRR no M,R,G. Distal pulses +2.  No cyanosis.   RESP: CTA B/L, normal respiratory effort  GI: +BS, NTND Extremities: No effusions, edema, in BLE MSK: deformed joints in left hand,  SKIN: warm, dry and intact Neuro: II - XII grossly intact.  Sensation intact   Data Reviewed: I have personally reviewed following labs and imaging studies  CBC: Recent Labs  Lab 03/22/20 1752 03/24/20 0457  WBC 8.1 7.4  NEUTROABS 6.3  --   HGB  11.0* 11.2*  HCT 33.6* 33.4*  MCV 95.7 93.6  PLT 175 382   Basic Metabolic Panel: Recent Labs  Lab 03/22/20 1752 03/23/20 1200 03/24/20 0457  NA 137  --  141  K 5.7*  --  5.0  CL 92*  --  94*  CO2 24  --  28  GLUCOSE 75  --  121*  BUN 81*  --  61*  CREATININE 13.32*  --  10.11*  CALCIUM 8.4*  --  9.0  MG  --   --  2.2  PHOS  --  10.0* 7.1*   GFR: Estimated Creatinine Clearance: 7.3 mL/min (A) (by C-G formula based on SCr of 10.11 mg/dL (H)). Liver Function Tests: Recent Labs  Lab 03/22/20 1752  AST 12*  ALT 13  ALKPHOS 62  BILITOT 1.0    PROT 6.8  ALBUMIN 3.1*   No results for input(s): LIPASE, AMYLASE in the last 168 hours. No results for input(s): AMMONIA in the last 168 hours. Coagulation Profile: No results for input(s): INR, PROTIME in the last 168 hours. Cardiac Enzymes: No results for input(s): CKTOTAL, CKMB, CKMBINDEX, TROPONINI in the last 168 hours. BNP (last 3 results) No results for input(s): PROBNP in the last 8760 hours. HbA1C: No results for input(s): HGBA1C in the last 72 hours. CBG: Recent Labs  Lab 03/24/20 0956  GLUCAP 95   Lipid Profile: No results for input(s): CHOL, HDL, LDLCALC, TRIG, CHOLHDL, LDLDIRECT in the last 72 hours. Thyroid Function Tests: No results for input(s): TSH, T4TOTAL, FREET4, T3FREE, THYROIDAB in the last 72 hours. Anemia Panel: No results for input(s): VITAMINB12, FOLATE, FERRITIN, TIBC, IRON, RETICCTPCT in the last 72 hours. Sepsis Labs: No results for input(s): PROCALCITON, LATICACIDVEN in the last 168 hours.  Recent Results (from the past 240 hour(s))  SARS CORONAVIRUS 2 (TAT 6-24 HRS) Nasopharyngeal Nasopharyngeal Swab     Status: None   Collection Time: 03/23/20  8:03 PM   Specimen: Nasopharyngeal Swab  Result Value Ref Range Status   SARS Coronavirus 2 NEGATIVE NEGATIVE Final    Comment: (NOTE) SARS-CoV-2 target nucleic acids are NOT DETECTED. The SARS-CoV-2 RNA is generally detectable in upper and lower respiratory specimens during the acute phase of infection. Negative results do not preclude SARS-CoV-2 infection, do not rule out co-infections with other pathogens, and should not be used as the sole basis for treatment or other patient management decisions. Negative results must be combined with clinical observations, patient history, and epidemiological information. The expected result is Negative. Fact Sheet for Patients: SugarRoll.be Fact Sheet for Healthcare Providers: https://www.woods-mathews.com/ This  test is not yet approved or cleared by the [place] FDA and  has been authorized for detection and/or diagnosis of SARS-CoV-2 by FDA under an Emergency Use Authorization (EUA). This EUA will remain  in effect (meaning this test can be used) for the duration of the COVID-19 declaration under Section 56 4(b)(1) of the Act, 21 U.S.C. section 360bbb-3(b)(1), unless the authorization is terminated or revoked sooner. Performed at [facility], [place] [address], [place], [place] [address]       Radiology Studies: MR THORACIC SPINE WO CONTRAST  Result Date: 03/23/2020 CLINICAL DATA:  Back pain EXAM: MRI THORACIC AND LUMBAR SPINE WITHOUT CONTRAST TECHNIQUE: Multiplanar and multiecho pulse sequences of the thoracic and lumbar spine were obtained without intravenous contrast. COMPARISON:  Thoracolumbar spine MRI 07/11/2019 FINDINGS: MRI THORACIC SPINE FINDINGS Alignment:  Physiologic. Vertebrae: Endplate remodeling at J6-73 is unchanged compared to 07/11/2019. There is  multifocal bone marrow heterogeneity, also unchanged. No acute abnormality. Cord: Assessment of the spinal cord is degraded by motion artifact. There may be a small area of hyperintense T2-weighted signal within the spinal cord at T9-10. Paraspinal and other soft tissues: Negative Disc levels: T9-10: Diffuse disc bulge with narrowing of the ventral thecal sac. No spinal canal stenosis. Other disc levels are unremarkable. MRI LUMBAR SPINE FINDINGS Segmentation:  Normal Alignment: Grade 1 retrolisthesis at L3-4 and grade 1 anterolisthesis at L4-5 Vertebrae: Heterogeneous bone marrow signal with type 2 Modic changes at L4-5. There is edema within the L2-3 disc space. No adjacent endplate edema. There is low T1-weighted signal throughout most of the lumbar spine. Conus medullaris and cauda equina: Conus extends to the L1-2 level. Conus and cauda equina appear normal. Paraspinal and other soft tissues: Atrophy of the psoas musculature. Diffuse  cystic change of both kidneys. Disc levels: T12-L1: Normal disc space and facet joints. There is no spinal canal stenosis. No neural foraminal stenosis. L1-L2: Normal disc space and facet joints. There is no spinal canal stenosis. No neural foraminal stenosis. L2-L3: Disc space edema with diffuse bulge and large osteophytes. Unchanged severe spinal canal stenosis. Unchanged severe bilateral neural foraminal stenosis. L3-L4: Diffuse disc bulge, slightly worsened. Slightly worsened severe spinal canal stenosis. No neural foraminal stenosis. L4-L5: Normal disc space and facet joints. Severe disc space narrowing with intermediate sized disc osteophyte complex and severe facet arthrosis. Unchanged severe spinal canal stenosis. Unchanged severe bilateral neural foraminal stenosis. L5-S1: Mild disc bulge and moderate facet arthrosis. There is no spinal canal stenosis. Unchanged mild left neural foraminal stenosis. Visualized sacrum: Normal. IMPRESSION: 1. Mild progression of endplate remodeling at N8-6 with slightly increased disc space edema. No edema within the endplates. 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 Correlation with CRP might be helpful. 2. Unchanged severe lumbar spinal canal stenosis and severe bilateral neural foraminal stenosis at L2-3 and L4-5 with marked mass effect on the cauda equina nerve roots at these levels. 3. 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 Electronically Signed   By: Ulyses Jarred M.D.   On: 03/23/2020 01:51   MR LUMBAR SPINE WO CONTRAST  Result Date: 03/23/2020 CLINICAL DATA:  Back pain EXAM: MRI THORACIC AND LUMBAR SPINE WITHOUT CONTRAST TECHNIQUE: Multiplanar and multiecho pulse sequences of the thoracic and lumbar spine were obtained without intravenous contrast. COMPARISON:  Thoracolumbar spine MRI 07/11/2019  FINDINGS: MRI THORACIC SPINE FINDINGS Alignment:  Physiologic. Vertebrae: Endplate remodeling at C9-47 is unchanged compared to 07/11/2019. There is multifocal bone marrow heterogeneity, also unchanged. No acute abnormality. Cord: Assessment of the spinal cord is degraded by motion artifact. There may be a small area of hyperintense T2-weighted signal within the spinal cord at T9-10. Paraspinal and other soft tissues: Negative Disc levels: T9-10: Diffuse disc bulge with narrowing of the ventral thecal sac. No spinal canal stenosis. Other disc levels are unremarkable. MRI LUMBAR SPINE FINDINGS Segmentation:  Normal Alignment: Grade 1 retrolisthesis at L3-4 and grade 1 anterolisthesis at L4-5 Vertebrae: Heterogeneous bone marrow signal with type 2 Modic changes at L4-5. There is edema within the L2-3 disc space. No adjacent endplate edema. There is low T1-weighted signal throughout most of the lumbar spine. Conus medullaris and cauda equina: Conus extends to the L1-2 level. Conus and cauda equina appear normal. Paraspinal and other soft tissues: Atrophy of the psoas musculature. Diffuse cystic change of both kidneys. Disc levels: T12-L1:  Normal disc space and facet joints. There is no spinal canal stenosis. No neural foraminal stenosis. L1-L2: Normal disc space and facet joints. There is no spinal canal stenosis. No neural foraminal stenosis. L2-L3: Disc space edema with diffuse bulge and large osteophytes. Unchanged severe spinal canal stenosis. Unchanged severe bilateral neural foraminal stenosis. L3-L4: Diffuse disc bulge, slightly worsened. Slightly worsened severe spinal canal stenosis. No neural foraminal stenosis. L4-L5: Normal disc space and facet joints. Severe disc space narrowing with intermediate sized disc osteophyte complex and severe facet arthrosis. Unchanged severe spinal canal stenosis. Unchanged severe bilateral neural foraminal stenosis. L5-S1: Mild disc bulge and moderate facet arthrosis. There is  no spinal canal stenosis. Unchanged mild left neural foraminal stenosis. Visualized sacrum: Normal. IMPRESSION: 1. Mild progression of endplate remodeling at O4-7 with slightly increased disc space edema. No edema within the endplates. 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 Correlation with CRP might be helpful. 2. Unchanged severe lumbar spinal canal stenosis and severe bilateral neural foraminal stenosis at L2-3 and L4-5 with marked mass effect on the cauda equina nerve roots at these levels. 3. 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 Electronically Signed   By: Ulyses Jarred M.D.   On: 03/23/2020 01:51     Scheduled Meds: . amLODipine  10 mg Oral QHS  . apixaban  5 mg Oral BID  . hydrALAZINE  50 mg Oral Q8H  . lidocaine  2 patch Transdermal BID  . ramipril  10 mg Oral Daily  . sevelamer carbonate  2,400 mg Oral TID WC   Continuous Infusions:   LOS: 1 day     Enzo Bi, MD [facility] If 7PM-7AM, please contact night-coverage 03/24/2020, 3:53 PM

## 2020-03-24 NOTE — Progress Notes (Signed)
VAT: Attempted for PIV x 3 with ultrasound failed. Not a candidate for a midline placement. Patient has small fragile veins, tenses up so bad with each attempt. RN made aware.

## 2020-03-24 NOTE — Progress Notes (Signed)
[facility]  ROUNDING NOTE   Subjective:  Patient completed dialysis today. Tolerated well.  Having right lower extremity pain.  Objective:  Vital signs in last 24 hours:  Temp:  [97.6 F (36.4 C)-98 F (36.7 C)] 97.6 F (36.4 C) (04/02 1145) Pulse Rate:  [74-85] 85 (04/02 1200) Resp:  [13-24] 23 (04/02 1200) BP: (95-186)/(60-112) 158/94 (04/02 1200) SpO2:  [95 %-100 %] 96 % (04/02 0440) Weight:  [91 kg] 91 kg (04/02 0920)  Weight change: 0 kg Filed Weights   03/22/20 1609 03/23/20 0727 03/24/20 0920  Weight: 91 kg 91 kg 91 kg    Intake/Output: I/O last 3 completed shifts: In: 33 [IV Piggyback:50] Out: 0    Intake/Output this shift:  Total I/O In: -  Out: 902 [Other:902]  Physical Exam: General: No acute distress  Head: Normocephalic, atraumatic. Moist oral mucosal membranes  Eyes: Anicteric  Neck: Supple, trachea midline  Lungs:  Clear to auscultation, normal effort  Heart: S1S2 no rubs  Abdomen:  Soft, nontender, bowel sounds present  Extremities: trace peripheral edema.  Neurologic: Awake, alert, following commands  Skin: No lesions  Access: LUE AVF    Basic Metabolic Panel: Recent Labs  Lab 03/22/20 1752 03/23/20 1200 03/24/20 0457  NA 137  --  141  K 5.7*  --  5.0  CL 92*  --  94*  CO2 24  --  28  GLUCOSE 75  --  121*  BUN 81*  --  61*  CREATININE 13.32*  --  10.11*  CALCIUM 8.4*  --  9.0  MG  --   --  2.2  PHOS  --  10.0* 7.1*    Liver Function Tests: Recent Labs  Lab 03/22/20 1752  AST 12*  ALT 13  ALKPHOS 62  BILITOT 1.0  PROT 6.8  ALBUMIN 3.1*   No results for input(s): LIPASE, AMYLASE in the last 168 hours. No results for input(s): AMMONIA in the last 168 hours.  CBC: Recent Labs  Lab 03/22/20 1752 03/24/20 0457  WBC 8.1 7.4  NEUTROABS 6.3  --   HGB 11.0* 11.2*  HCT 33.6* 33.4*  MCV 95.7 93.6  PLT 175 195    Cardiac Enzymes: No results for input(s): CKTOTAL, CKMB, CKMBINDEX, TROPONINI in the last  168 hours.  BNP: Invalid input(s): POCBNP  CBG: Recent Labs  Lab 03/24/20 0956  GLUCAP 95    Microbiology: Results for orders placed or performed during the hospital encounter of 03/22/20  SARS CORONAVIRUS 2 (TAT 6-24 HRS) Nasopharyngeal Nasopharyngeal Swab     Status: None   Collection Time: 03/23/20  8:03 PM   Specimen: Nasopharyngeal Swab  Result Value Ref Range Status   SARS Coronavirus 2 NEGATIVE NEGATIVE Final    Comment: (NOTE) SARS-CoV-2 target nucleic acids are NOT DETECTED. The SARS-CoV-2 RNA is generally detectable in upper and lower respiratory specimens during the acute phase of infection. Negative results do not preclude SARS-CoV-2 infection, do not rule out co-infections with other pathogens, and should not be used as the sole basis for treatment or other patient management decisions. Negative results must be combined with clinical observations, patient history, and epidemiological information. The expected result is Negative. Fact Sheet for Patients: SugarRoll.be Fact Sheet for Healthcare Providers: https://www.woods-mathews.com/ This test is not yet approved or cleared by the [place] FDA and  has been authorized for detection and/or diagnosis of SARS-CoV-2 by FDA under an Emergency Use Authorization (EUA). This EUA will remain  in effect (meaning this  test can be used) for the duration of the COVID-19 declaration under Section 56 4(b)(1) of the Act, 21 U.S.C. section 360bbb-3(b)(1), unless the authorization is terminated or revoked sooner. Performed at [facility], [place] [address] [place], [place] [address]     Coagulation Studies: No results for input(s): LABPROT, INR in the last 72 hours.  Urinalysis: No results for input(s): COLORURINE, LABSPEC, PHURINE, GLUCOSEU, HGBUR, BILIRUBINUR, KETONESUR, PROTEINUR, UROBILINOGEN, NITRITE, LEUKOCYTESUR in the last 72 hours.  Invalid input(s): APPERANCEUR     Imaging: MR THORACIC SPINE WO CONTRAST  Result Date: 03/23/2020 CLINICAL DATA:  Back pain EXAM: MRI THORACIC AND LUMBAR SPINE WITHOUT CONTRAST TECHNIQUE: Multiplanar and multiecho pulse sequences of the thoracic and lumbar spine were obtained without intravenous contrast. COMPARISON:  Thoracolumbar spine MRI 07/11/2019 FINDINGS: MRI THORACIC SPINE FINDINGS Alignment:  Physiologic. Vertebrae: Endplate remodeling at U2-02 is unchanged compared to 07/11/2019. There is multifocal bone marrow heterogeneity, also unchanged. No acute abnormality. Cord: Assessment of the spinal cord is degraded by motion artifact. There may be a small area of hyperintense T2-weighted signal within the spinal cord at T9-10. Paraspinal and other soft tissues: Negative Disc levels: T9-10: Diffuse disc bulge with narrowing of the ventral thecal sac. No spinal canal stenosis. Other disc levels are unremarkable. MRI LUMBAR SPINE FINDINGS Segmentation:  Normal Alignment: Grade 1 retrolisthesis at L3-4 and grade 1 anterolisthesis at L4-5 Vertebrae: Heterogeneous bone marrow signal with type 2 Modic changes at L4-5. There is edema within the L2-3 disc space. No adjacent endplate edema. There is low T1-weighted signal throughout most of the lumbar spine. Conus medullaris and cauda equina: Conus extends to the L1-2 level. Conus and cauda equina appear normal. Paraspinal and other soft tissues: Atrophy of the psoas musculature. Diffuse cystic change of both kidneys. Disc levels: T12-L1: Normal disc space and facet joints. There is no spinal canal stenosis. No neural foraminal stenosis. L1-L2: Normal disc space and facet joints. There is no spinal canal stenosis. No neural foraminal stenosis. L2-L3: Disc space edema with diffuse bulge and large osteophytes. Unchanged severe spinal canal stenosis. Unchanged severe bilateral neural foraminal stenosis. L3-L4: Diffuse disc bulge, slightly worsened. Slightly worsened severe spinal canal stenosis. No  neural foraminal stenosis. L4-L5: Normal disc space and facet joints. Severe disc space narrowing with intermediate sized disc osteophyte complex and severe facet arthrosis. Unchanged severe spinal canal stenosis. Unchanged severe bilateral neural foraminal stenosis. L5-S1: Mild disc bulge and moderate facet arthrosis. There is no spinal canal stenosis. Unchanged mild left neural foraminal stenosis. Visualized sacrum: Normal. IMPRESSION: 1. Mild progression of endplate remodeling at R4-2 with slightly increased disc space edema. No edema within the endplates. 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 Correlation with CRP might be helpful. 2. Unchanged severe lumbar spinal canal stenosis and severe bilateral neural foraminal stenosis at L2-3 and L4-5 with marked mass effect on the cauda equina nerve roots at these levels. 3. 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 Electronically Signed   By: Ulyses Jarred M.D.   On: 03/23/2020 01:51   MR LUMBAR SPINE WO CONTRAST  Result Date: 03/23/2020 CLINICAL DATA:  Back pain EXAM: MRI THORACIC AND LUMBAR SPINE WITHOUT CONTRAST TECHNIQUE: Multiplanar and multiecho pulse sequences of the thoracic and lumbar spine were obtained without intravenous contrast. COMPARISON:  Thoracolumbar spine MRI 07/11/2019 FINDINGS: MRI THORACIC SPINE FINDINGS Alignment:  Physiologic. Vertebrae: Endplate remodeling at B7-62 is unchanged compared to 07/11/2019. There  is multifocal bone marrow heterogeneity, also unchanged. No acute abnormality. Cord: Assessment of the spinal cord is degraded by motion artifact. There may be a small area of hyperintense T2-weighted signal within the spinal cord at T9-10. Paraspinal and other soft tissues: Negative Disc levels: T9-10: Diffuse disc bulge with narrowing of the ventral thecal sac. No  spinal canal stenosis. Other disc levels are unremarkable. MRI LUMBAR SPINE FINDINGS Segmentation:  Normal Alignment: Grade 1 retrolisthesis at L3-4 and grade 1 anterolisthesis at L4-5 Vertebrae: Heterogeneous bone marrow signal with type 2 Modic changes at L4-5. There is edema within the L2-3 disc space. No adjacent endplate edema. There is low T1-weighted signal throughout most of the lumbar spine. Conus medullaris and cauda equina: Conus extends to the L1-2 level. Conus and cauda equina appear normal. Paraspinal and other soft tissues: Atrophy of the psoas musculature. Diffuse cystic change of both kidneys. Disc levels: T12-L1: Normal disc space and facet joints. There is no spinal canal stenosis. No neural foraminal stenosis. L1-L2: Normal disc space and facet joints. There is no spinal canal stenosis. No neural foraminal stenosis. L2-L3: Disc space edema with diffuse bulge and large osteophytes. Unchanged severe spinal canal stenosis. Unchanged severe bilateral neural foraminal stenosis. L3-L4: Diffuse disc bulge, slightly worsened. Slightly worsened severe spinal canal stenosis. No neural foraminal stenosis. L4-L5: Normal disc space and facet joints. Severe disc space narrowing with intermediate sized disc osteophyte complex and severe facet arthrosis. Unchanged severe spinal canal stenosis. Unchanged severe bilateral neural foraminal stenosis. L5-S1: Mild disc bulge and moderate facet arthrosis. There is no spinal canal stenosis. Unchanged mild left neural foraminal stenosis. Visualized sacrum: Normal. IMPRESSION: 1. Mild progression of endplate remodeling at E3-1 with slightly increased disc space edema. No edema within the endplates. 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 Correlation with CRP might be helpful. 2. Unchanged severe lumbar spinal canal stenosis and severe bilateral neural foraminal stenosis  at L2-3 and L4-5 with marked mass effect on the cauda equina nerve roots at these levels. 3. 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 Electronically Signed   By: Ulyses Jarred M.D.   On: 03/23/2020 01:51     Medications:    . amLODipine  10 mg Oral QHS  . apixaban  5 mg Oral BID  . hydrALAZINE  50 mg Oral Q8H  . lidocaine  2 patch Transdermal BID  . patiromer  8.4 g Oral Daily  . ramipril  10 mg Oral Daily   HYDROcodone-acetaminophen, labetalol, morphine injection, ondansetron **OR** ondansetron (ZOFRAN) IV, senna-docusate  Assessment/ Plan:  74 y.o. male with end-stage renal disease, hypertension, hyperlipidemia, degenerative disc disease, anemia chronic kidney disease, secondary hyperparathyroidism admitted with back pain and hyperkalemia.   [facility]/[address]/MWF  1.  ESRD on HD MWF:  Pt completed HD today, next scheduled treatment for Monday if still here.   2.  Hyperkalemia:  Will stop veltassa now.   3.  Anemia of chronic kidney disease.  Hemoglobin currently 11.2.  Hold off on Epogen at this time.  4.  Secondary hyperparathyroidism.  Phosphorus quite high at 7.1.  Restart the patient on Renvela 3 tablets p.o. 3 times daily with meals which he was taking previously.   LOS: 1 Kayia Billinger 4/2/20213:11 PM

## 2020-03-24 NOTE — Progress Notes (Signed)
PT Cancellation Note  Patient Details Name: Brian Fleming MRN: [id number] DOB: July 26, 1946   Cancelled Treatment:    Reason Eval/Treat Not Completed: Other (comment)(Pt currently at HD, PT to re-attempt as able.)   Lieutenant Diego PT, DPT 9:55 AM,03/24/20

## 2020-03-25 LAB — CBC
HCT: 33.4 % — ABNORMAL LOW (ref 39.0–52.0)
Hemoglobin: 11 g/dL — ABNORMAL LOW (ref 13.0–17.0)
MCH: 31.5 pg (ref 26.0–34.0)
MCHC: 32.9 g/dL (ref 30.0–36.0)
MCV: 95.7 fL (ref 80.0–100.0)
Platelets: 182 10*3/uL (ref 150–400)
RBC: 3.49 MIL/uL — ABNORMAL LOW (ref 4.22–5.81)
RDW: 15.6 % — ABNORMAL HIGH (ref 11.5–15.5)
WBC: 6 10*3/uL (ref 4.0–10.5)
nRBC: 0 % (ref 0.0–0.2)

## 2020-03-25 LAB — BASIC METABOLIC PANEL
Anion gap: 16 — ABNORMAL HIGH (ref 5–15)
BUN: 47 mg/dL — ABNORMAL HIGH (ref 8–23)
CO2: 30 mmol/L (ref 22–32)
Calcium: 9 mg/dL (ref 8.9–10.3)
Chloride: 92 mmol/L — ABNORMAL LOW (ref 98–111)
Creatinine, Ser: 8.15 mg/dL — ABNORMAL HIGH (ref 0.61–1.24)
GFR calc Af Amer: 7 mL/min — ABNORMAL LOW (ref >60)
GFR calc non Af Amer: 6 mL/min — ABNORMAL LOW (ref >60)
Glucose, Bld: 93 mg/dL (ref 70–99)
Potassium: 5.1 mmol/L (ref 3.5–5.1)
Sodium: 138 mmol/L (ref 135–145)

## 2020-03-25 LAB — MAGNESIUM: Magnesium: 2.2 mg/dL (ref 1.7–2.4)

## 2020-03-25 MED ORDER — HEPARIN SODIUM (PORCINE) 5000 UNIT/ML IJ SOLN
5000.0000 [IU] | Freq: Three times a day (TID) | INTRAMUSCULAR | Status: AC
  Administered 2020-03-25 – 2020-03-26 (×3): 5000 [IU] via SUBCUTANEOUS
  Filled 2020-03-25 (×3): qty 1

## 2020-03-25 MED ORDER — HYDROCODONE-ACETAMINOPHEN 5-325 MG PO TABS
1.0000 | ORAL_TABLET | Freq: Four times a day (QID) | ORAL | Status: DC | PRN
  Administered 2020-03-25: 1 via ORAL
  Administered 2020-03-25 – 2020-03-29 (×10): 2 via ORAL
  Filled 2020-03-25 (×5): qty 2
  Filled 2020-03-25: qty 1
  Filled 2020-03-25 (×5): qty 2

## 2020-03-25 NOTE — Progress Notes (Signed)
[facility]  ROUNDING NOTE   Subjective:   Hemodialysis treatment yesterday. Tolerated treatment. UF of 959mL.   Complains of pain in legs today.   Objective:  Vital signs in last 24 hours:  Temp:  [97.4 F (36.3 C)-98.1 F (36.7 C)] 97.7 F (36.5 C) (04/03 1221) Pulse Rate:  [53-81] 63 (04/03 1221) Resp:  [16-20] 16 (04/03 0450) BP: (126-156)/(70-87) 156/87 (04/03 1221) SpO2:  [92 %-97 %] 97 % (04/03 1221)  Weight change: 0 kg Filed Weights   03/22/20 1609 03/23/20 0727 03/24/20 0920  Weight: 91 kg 91 kg 91 kg    Intake/Output: I/O last 3 completed shifts: In: 240 [P.O.:240] Out: 902 [Other:902]   Intake/Output this shift:  Total I/O In: 240 [P.O.:240] Out: -   Physical Exam: General: No acute distress  Head: Normocephalic, atraumatic. Moist oral mucosal membranes  Eyes: Anicteric  Neck: Supple, trachea midline  Lungs:  Clear to auscultation, normal effort  Heart: regular  Abdomen:  Soft, nontender, bowel sounds present  Extremities: No peripheral edema.  Neurologic: Awake, alert, following commands  Skin: No lesions  Access: LUE AVF    Basic Metabolic Panel: Recent Labs  Lab 03/22/20 1752 03/23/20 1200 03/24/20 0457 03/25/20 0522  NA 137  --  141 138  K 5.7*  --  5.0 5.1  CL 92*  --  94* 92*  CO2 24  --  28 30  GLUCOSE 75  --  121* 93  BUN 81*  --  61* 47*  CREATININE 13.32*  --  10.11* 8.15*  CALCIUM 8.4*  --  9.0 9.0  MG  --   --  2.2 2.2  PHOS  --  10.0* 7.1*  --     Liver Function Tests: Recent Labs  Lab 03/22/20 1752  AST 12*  ALT 13  ALKPHOS 62  BILITOT 1.0  PROT 6.8  ALBUMIN 3.1*   No results for input(s): LIPASE, AMYLASE in the last 168 hours. No results for input(s): AMMONIA in the last 168 hours.  CBC: Recent Labs  Lab 03/22/20 1752 03/24/20 0457 03/25/20 0522  WBC 8.1 7.4 6.0  NEUTROABS 6.3  --   --   HGB 11.0* 11.2* 11.0*  HCT 33.6* 33.4* 33.4*  MCV 95.7 93.6 95.7  PLT 175 195 182    Cardiac  Enzymes: No results for input(s): CKTOTAL, CKMB, CKMBINDEX, TROPONINI in the last 168 hours.  BNP: Invalid input(s): POCBNP  CBG: Recent Labs  Lab 03/24/20 0956  GLUCAP 95    Microbiology: Results for orders placed or performed during the hospital encounter of 03/22/20  SARS CORONAVIRUS 2 (TAT 6-24 HRS) Nasopharyngeal Nasopharyngeal Swab     Status: None   Collection Time: 03/23/20  8:03 PM   Specimen: Nasopharyngeal Swab  Result Value Ref Range Status   SARS Coronavirus 2 NEGATIVE NEGATIVE Final    Comment: (NOTE) SARS-CoV-2 target nucleic acids are NOT DETECTED. The SARS-CoV-2 RNA is generally detectable in upper and lower respiratory specimens during the acute phase of infection. Negative results do not preclude SARS-CoV-2 infection, do not rule out co-infections with other pathogens, and should not be used as the sole basis for treatment or other patient management decisions. Negative results must be combined with clinical observations, patient history, and epidemiological information. The expected result is Negative. Fact Sheet for Patients: SugarRoll.be Fact Sheet for Healthcare Providers: https://www.woods-mathews.com/ This test is not yet approved or cleared by the [place] FDA and  has been authorized for detection and/or diagnosis  of SARS-CoV-2 by FDA under an Emergency Use Authorization (EUA). This EUA will remain  in effect (meaning this test can be used) for the duration of the COVID-19 declaration under Section 56 4(b)(1) of the Act, 21 U.S.C. section 360bbb-3(b)(1), unless the authorization is terminated or revoked sooner. Performed at [facility], [place] [address], [place], [place] [address]     Coagulation Studies: No results for input(s): LABPROT, INR in the last 72 hours.  Urinalysis: No results for input(s): COLORURINE, LABSPEC, PHURINE, GLUCOSEU, HGBUR, BILIRUBINUR, KETONESUR, PROTEINUR,  UROBILINOGEN, NITRITE, LEUKOCYTESUR in the last 72 hours.  Invalid input(s): APPERANCEUR    Imaging: DG Chest 2 View  Result Date: 03/24/2020 CLINICAL DATA:  Screening for TB EXAM: CHEST - 2 VIEW COMPARISON:  02/10/2020, CT 01/25/2019 FINDINGS: Rotated patient. Enlarged cardiomediastinal silhouette with vascular congestion. Mild diffuse chronic appearing interstitial opacity. Possible trace right pleural effusion. No pneumothorax. IMPRESSION: 1. No focal pneumonia. 2. Cardiomegaly with vascular congestion and suspected trace right pleural effusion Electronically Signed   By: Donavan Foil M.D.   On: 03/24/2020 18:30     Medications:    . amLODipine  10 mg Oral QHS  . heparin injection (subcutaneous)  5,000 Units Subcutaneous Q8H  . hydrALAZINE  50 mg Oral Q8H  . lidocaine  2 patch Transdermal BID  . ramipril  10 mg Oral Daily  . sevelamer carbonate  2,400 mg Oral TID WC   HYDROcodone-acetaminophen, labetalol, ondansetron **OR** ondansetron (ZOFRAN) IV, senna-docusate  Assessment/ Plan:   [name] is a 74 y.o. black male with end-stage renal disease on hemodialysis, hypertension, hyperlipidemia, degenerative disc disease, admitted to [facility] on 03/22/2020 for Back pain [M54.9] Intractable back pain [M54.9] Acute midline low back pain without sciatica [M54.5]  [facility] [address] MWF  1.  ESRD on HD MWF: hyperkalemia on admission - Next dialysis for Monday.   2. Hypertension:  - ramipril, amlodipine and hydralazine.   3. Secondary Hyperparathyroidism:  - sevelamer with meals.   4. Anemia with chronic kidney disease: hemoglobin 11 No indication for EPO at this time.    LOS: 2 Brian Fleming 4/3/20213:57 PM

## 2020-03-25 NOTE — Plan of Care (Signed)
Continuing with plan of care. 

## 2020-03-25 NOTE — Progress Notes (Addendum)
PROGRESS NOTE    Brian Fleming  JJH:[id number] DOB: [birth year] DOA: 03/22/2020 PCP: [facility]    Assessment & Plan:   Principal Problem:   Intractable back pain Active Problems:   Essential hypertension   End stage renal disease on dialysis ([place])   Hyperkalemia   Atrial flutter (HCC)   Back pain    Brian Fleming is a 74 y.o. male with medical history significant for hypertension, A. fib on Eliquis, ESRD on HD MWF, with a history of chronic back pain, hospitalized in February 2021 for intractable back pain who returns to the emergency room with a complaint of back pain that has been worse over the past 3 days.  Denies weakness in the extremities or in the groin area, denies bladder or bowel problems.   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    Essential hypertension -Continue home meds of amlodipine and ramipril    End stage renal disease on dialysis MWF(HCC)   Hyperkalemia --pt missed dialysis 2/2 back pain -Patient received a dose of Veltassa in the ER.   -Nephrology consult for continuation of dialysis, received dialysis today -Continue sevelamer    Atrial flutter (HCC) -Currently rate controlled.  Hold apixaban for upcoming IR biopsy  on Monday   DVT prophylaxis: Heparin SQ Code Status: Full code  Family Communication:  Disposition Plan: Discharge after Monday after IR biopsy and neurosurgery workup of back pain.  Family wants pt to be placed into "family care home", TOC is working on placement.   Subjective and Interval History:  Overnight and this morning, pt continuously complained pain, in his right leg, in his back and then in his abdomen.  Oral pain med increased overnight.  No fever, N/V/D.   Objective: Vitals:   03/24/20 1200 03/24/20 2018 03/25/20 0450 03/25/20 1221  BP: (!) 158/94 126/70 139/82 (!) 156/87  Pulse: 85 (!) 53 81 63  Resp: (!) 23 20 16    Temp:  (!) 97.4 F (36.3 C) 98.1 F (36.7 C) 97.7 F (36.5 C)  TempSrc:  Oral Oral   SpO2:  97% 92% 97%  Weight:      Height:        Intake/Output Summary (Last 24 hours) at 03/25/2020 1240 Last data filed at 03/25/2020 0022 Gross per 24 hour  Intake 120 ml  Output 0 ml  Net 120 ml   Filed Weights   03/22/20 1609 03/23/20 0727 03/24/20 0920  Weight: 91 kg 91 kg 91 kg    Examination:   Constitutional: alternating between sleeping and moaning HEENT: conjunctivae and lids normal, EOMI CV: RRR no M,R,G. Distal pulses +2.  No cyanosis.   RESP: CTA B/L, normal respiratory effort  GI: +BS, NTND Extremities: No effusions, edema, in BLE MSK: deformed joints in left hand,  SKIN: warm, dry and intact Neuro: II - XII grossly intact.  Sensation intact  Data Reviewed: I have personally reviewed following labs and imaging studies  CBC: Recent Labs  Lab 03/22/20 1752 03/24/20 0457 03/25/20 0522  WBC 8.1 7.4 6.0  NEUTROABS 6.3  --   --   HGB 11.0* 11.2* 11.0*  HCT 33.6* 33.4* 33.4*  MCV 95.7 93.6 95.7  PLT 175 195 818   Basic Metabolic Panel: Recent Labs  Lab 03/22/20 1752 03/23/20 1200 03/24/20 0457 03/25/20 0522  NA 137  --  141 138  K 5.7*  --  5.0 5.1  CL 92*  --  94* 92*  CO2 24  --  28 30  GLUCOSE 75  --  121* 93  BUN 81*   --  61* 47*  CREATININE 13.32*  --  10.11* 8.15*  CALCIUM 8.4*  --  9.0 9.0  MG  --   --  2.2 2.2  PHOS  --  10.0* 7.1*  --    GFR: Estimated Creatinine Clearance: 9.1 mL/min (A) (by C-G formula based on SCr of 8.15 mg/dL (H)). Liver Function Tests: Recent Labs  Lab 03/22/20 1752  AST 12*  ALT 13  ALKPHOS 62  BILITOT 1.0  PROT 6.8  ALBUMIN 3.1*   No results for input(s): LIPASE, AMYLASE in the last 168 hours. No results for input(s): AMMONIA in the last 168 hours. Coagulation Profile: No results for input(s): INR, PROTIME in the last 168 hours. Cardiac Enzymes: No results for input(s): CKTOTAL, CKMB, CKMBINDEX, TROPONINI in the last 168 hours. BNP (last 3 results) No results for input(s): PROBNP in the last 8760 hours. HbA1C: No results for input(s): HGBA1C in the last 72 hours. CBG: Recent Labs  Lab 03/24/20 0956  GLUCAP 95   Lipid Profile: No results for input(s): CHOL, HDL, LDLCALC, TRIG, CHOLHDL, LDLDIRECT in the last 72 hours. Thyroid Function Tests: No results for input(s): TSH, T4TOTAL, FREET4, T3FREE, THYROIDAB in the last 72 hours. Anemia Panel: No results for input(s): VITAMINB12, FOLATE, FERRITIN, TIBC, IRON, RETICCTPCT in the last 72 hours. Sepsis Labs: No results for input(s): PROCALCITON, LATICACIDVEN in the last 168 hours.  Recent Results (from the past 240 hour(s))  SARS CORONAVIRUS 2 (TAT 6-24 HRS) Nasopharyngeal Nasopharyngeal Swab     Status: None   Collection Time: 03/23/20  8:03 PM   Specimen: Nasopharyngeal Swab  Result Value Ref Range Status   SARS Coronavirus 2 NEGATIVE NEGATIVE Final    Comment: (NOTE) SARS-CoV-2 target nucleic acids are NOT DETECTED. The SARS-CoV-2 RNA is generally detectable in upper and lower respiratory specimens during the acute phase of infection. Negative results do not preclude SARS-CoV-2 infection, do not rule out co-infections with other pathogens, and should not be used as the sole basis for treatment or  other patient management decisions. Negative results must be combined with clinical observations, patient history, and epidemiological information. The expected result is Negative. Fact Sheet for Patients: SugarRoll.be Fact Sheet for Healthcare Providers: https://www.woods-mathews.com/ This test is not yet approved or cleared by the [place] FDA and  has been authorized for detection and/or diagnosis of SARS-CoV-2 by FDA under an Emergency Use Authorization (EUA). This EUA will remain  in effect (meaning this test can be used) for the duration of the COVID-19 declaration under Section 56 4(b)(1) of the Act, 21 U.S.C. section 360bbb-3(b)(1), unless the authorization is terminated or revoked sooner. Performed at [facility], [place] [address], [place], [place] [address]       Radiology Studies: DG Chest 2 View  Result Date: 03/24/2020 CLINICAL  DATA:  Screening for TB EXAM: CHEST - 2 VIEW COMPARISON:  02/10/2020, CT 01/25/2019 FINDINGS: Rotated patient. Enlarged cardiomediastinal silhouette with vascular congestion. Mild diffuse chronic appearing interstitial opacity. Possible trace right pleural effusion. No pneumothorax. IMPRESSION: 1. No focal pneumonia. 2. Cardiomegaly with vascular congestion and suspected trace right pleural effusion Electronically Signed   By: Donavan Foil M.D.   On: 03/24/2020 18:30     Scheduled Meds: . amLODipine  10 mg Oral QHS  . hydrALAZINE  50 mg Oral Q8H  . lidocaine  2 patch Transdermal BID  . ramipril  10 mg Oral Daily  . sevelamer carbonate  2,400 mg Oral TID WC   Continuous Infusions:   LOS: 2 days     Enzo Bi, MD [facility] If 7PM-7AM, please contact night-coverage 03/25/2020, 12:40 PM

## 2020-03-25 NOTE — Progress Notes (Signed)
Patient yelling out in pain with c/o right leg pain. Norco given about an hour ago. Provider contacted and given new order for increased amount of pain medication. Will give new ordered med.

## 2020-03-25 NOTE — Evaluation (Signed)
Physical Therapy Evaluation Patient Details Name: Brian Fleming MRN: [id number] DOB: [birth year] Today's Date: 03/25/2020   History of Present Illness  Brian Fleming is a 74 y.o. male with medical history significant for hypertension, A. fib on Eliquis, ESRD on HD MWF, with a history of chronic back pain, hospitalized in February 2021 for intractable back pain who returns to the emergency room with a complaint of back pain that has been worse over the past 3 days.  Denies weakness in the extremities or in the groin area, denies bladder or bowel problems.  Clinical Impression  Pt is a 74 yo male admitted for above. Pt received resting in supine and agreeable to participate with PT to get to recliner to be able to eat breakfast. Pt then got angry/agitated when therapist asked who he lived with in order to get home set up and PLOF information and agitation only progressed as session continued. Pt reporting that his son lives with him and that the pt is the one who is paying all the bills. Pt's son does work so appears there are periods of time where pt is home alone. Pt appeared to have some difficulty understanding subjective questions and increasingly becoming more agitated so unsure of baseline cognitive functioning and accurate home set up. Per chart review, information gathered this date does not fully match from previous encounter. Pt reporting no pain start of session. Pt able to get EOB without physical assist. Pt required cuing for hand placement and safe use of RW. Pt required min A for transfer to recliner with min A to steady and RW mgt with pt mostly impulsive during session and not listening to instructions from therapist. After therapist set breakfast tray in front of pt in recliner pt demanding to return to bed due to severe pain. Pt very agitated at this point and only getting worse and requesting pain medications.pt encouraged to sit in recliner and educated on benefits with pt refusing.  Pt attempted trying to get to bed before therapist could assist with feet slipping and unable to fully stand. Pt required min A to return to supine with no carryover of safe transfer technique from prior. Therapist provided blankets, call bell, phone and tray in reach and put up bottom bed rail. Pt then demanding bed rail to be put down so he could sit up. Pt educated on bed rail for safety with pt becoming even more agitated and yelling about pain while also cussing out therapist. Nursing came in to assist with calming pt down, encouraging better positioning in bed and providing pain medications. Pt yelling about pain entire time from initial sitting EOB to when therapist left room. This session very limited by pts pain and agitation. Suspect with improved pain control pt will be able to safely perform transfers and ambulation with RW and min guard assist. Currently recommending HHPT and will continue to reassess. However, suspect pt will refuse any therapy after hospitalization. Pt will continue to benefit from acute PT to further assess functional mobility and improve strength, pain, ROM, balance and endurance deficits currently limiting functional mobility.     Follow Up Recommendations Home health PT    Equipment Recommendations  Rolling walker with 5" wheels    Recommendations for Other Services       Precautions / Restrictions Precautions Precautions: Fall Restrictions Weight Bearing Restrictions: No      Mobility  Bed Mobility Overal bed mobility: Needs Assistance Bed Mobility: Supine to Sit     Supine to  sit: Modified independent (Device/Increase time);HOB elevated     General bed mobility comments: pt able to move independently in bed and get EOB without physical assist, use of bed rails  Transfers Overall transfer level: Needs assistance Equipment used: Rolling walker (2 wheeled) Transfers: Sit to/from [facility] Sit to Stand: Min assist Stand pivot  transfers: Min assist       General transfer comment: min A to rise, cuing for correct use of RW with no carryover, pt impulsively standing up, min A to get to recliner, then pt demanding to go back to bed and trying to get up without assistance, pt not listening to instructions and not allowing therapist to provide assistance and therefore almost sliding out of chair (pt without grippy socks on yet) and yelling at therapist that therapist was going to make him fall, therapist attempted to calm and relax pt, pt able to scoot back in chair and transfer back to bed with improved safety with min A from therapist  Ambulation/Gait             General Gait Details: deferred  Stairs            Wheelchair Mobility    Modified Rankin (Stroke Patients Only)       Balance Overall balance assessment: Needs assistance Sitting-balance support: Feet supported;Single extremity supported Sitting balance-Leahy Scale: Fair     Standing balance support: Bilateral upper extremity supported;During functional activity Standing balance-Leahy Scale: Fair Standing balance comment: suspect pt able to maintain static standing without UE support but diff to assess due to pts behavior during eval                             Pertinent Vitals/Pain Pain Assessment: 0-10 Pain Score: 10-Worst pain ever Pain Descriptors / Indicators: Moaning;Sharp;Grimacing Pain Intervention(s): Limited activity within patient's tolerance;RN gave pain meds during session;Repositioned;Monitored during session    [place] expects to be discharged to:: Private residence Living Arrangements: Children Available Help at Discharge: Family;Available PRN/intermittently Type of Home: House Home Access: Stairs to enter Entrance Stairs-Rails: None Entrance Stairs-Number of Steps: 2 Home Layout: One level Home Equipment: Environmental consultant - 2 wheels Additional Comments: pt increasingly agitated with  therapist trying to ask questions about equipment at home and home set up, information provided today did not completely match from recent previous encounter    Prior Function Level of Independence: Independent with assistive device(s)         Comments: pt reporting being independent with all ADLs stating "he is a grown ass man"     Hand Dominance        Extremity/Trunk Assessment   Upper Extremity Assessment Upper Extremity Assessment: Overall WFL for tasks assessed;Generalized weakness    Lower Extremity Assessment Lower Extremity Assessment: Generalized weakness;Difficult to assess due to impaired cognition(pt not cooperative and extremely agitated during session)       Communication   Communication: No difficulties  Cognition Arousal/Alertness: Awake/alert Behavior During Therapy: Agitated Overall Cognitive Status: No family/caregiver present to determine baseline cognitive functioning                                 General Comments: pt would not state his name or birthday despite being asked several times, knew he was in hospital, seemed to have difficulty understanding several questions about PLOF and home set up, information provided not completely  consistent with recent encounter notes, extremely agitated and angry during session and spent most of session yelling/moaning due to pain      General Comments      Exercises     Assessment/Plan    PT Assessment Patient needs continued PT services  PT Problem List Decreased strength;Decreased mobility;Decreased safety awareness;Decreased cognition;Decreased activity tolerance;Decreased range of motion;Decreased knowledge of use of DME;Decreased balance;Pain       PT Treatment Interventions DME instruction;Therapeutic exercise;Gait training;Balance training;Stair training;Neuromuscular re-education;Functional mobility training;Therapeutic activities;Patient/family education    PT Goals (Current goals  can be found in the Care Plan section)  Acute Rehab PT Goals Patient Stated Goal: reduce pain PT Goal Formulation: With patient Time For Goal Achievement: 04/08/20 Potential to Achieve Goals: Fair    Frequency Min 2X/week   Barriers to discharge Decreased caregiver support      Co-evaluation               AM-PAC PT "6 Clicks" Mobility  Outcome Measure Help needed turning from your back to your side while in a flat bed without using bedrails?: None Help needed moving from lying on your back to sitting on the side of a flat bed without using bedrails?: None Help needed moving to and from a bed to a chair (including a wheelchair)?: A Little Help needed standing up from a chair using your arms (e.g., wheelchair or bedside chair)?: A Little Help needed to walk in hospital room?: A Little Help needed climbing 3-5 steps with a railing? : A Little 6 Click Score: 20    End of Session Equipment Utilized During Treatment: Gait belt Activity Tolerance: Patient limited by pain Patient left: in bed;with call bell/phone within reach;with bed alarm set;with nursing/sitter in room Nurse Communication: Mobility status;Patient requests pain meds PT Visit Diagnosis: Difficulty in walking, not elsewhere classified (R26.2);Muscle weakness (generalized) (M62.81);Pain Pain - Right/Left: (low back, bil LE) Pain - part of body: Leg(low back and bil LEs)    Time: 7510-2585 PT Time Calculation (min) (ACUTE ONLY): 26 min   Charges:   PT Evaluation $PT Eval Moderate Complexity: [address] PT, DPT 4:08 PM,03/25/20 [phone]   [place]ina Evelyn Drucilla Chalet 03/25/2020, 3:58 PM

## 2020-03-26 DIAGNOSIS — M4800 Spinal stenosis, site unspecified: Secondary | ICD-10-CM | POA: Diagnosis present

## 2020-03-26 DIAGNOSIS — M4646 Discitis, unspecified, lumbar region: Secondary | ICD-10-CM | POA: Diagnosis present

## 2020-03-26 DIAGNOSIS — D649 Anemia, unspecified: Secondary | ICD-10-CM | POA: Diagnosis present

## 2020-03-26 LAB — CBC
HCT: 31.2 % — ABNORMAL LOW (ref 39.0–52.0)
Hemoglobin: 10.4 g/dL — ABNORMAL LOW (ref 13.0–17.0)
MCH: 31.5 pg (ref 26.0–34.0)
MCHC: 33.3 g/dL (ref 30.0–36.0)
MCV: 94.5 fL (ref 80.0–100.0)
Platelets: 181 10*3/uL (ref 150–400)
RBC: 3.3 MIL/uL — ABNORMAL LOW (ref 4.22–5.81)
RDW: 15.3 % (ref 11.5–15.5)
WBC: 5.6 10*3/uL (ref 4.0–10.5)
nRBC: 0 % (ref 0.0–0.2)

## 2020-03-26 LAB — BASIC METABOLIC PANEL WITH GFR
Anion gap: 14 (ref 5–15)
BUN: 62 mg/dL — ABNORMAL HIGH (ref 8–23)
CO2: 29 mmol/L (ref 22–32)
Calcium: 9 mg/dL (ref 8.9–10.3)
Chloride: 95 mmol/L — ABNORMAL LOW (ref 98–111)
Creatinine, Ser: 9.97 mg/dL — ABNORMAL HIGH (ref 0.61–1.24)
GFR calc Af Amer: 5 mL/min — ABNORMAL LOW
GFR calc non Af Amer: 5 mL/min — ABNORMAL LOW
Glucose, Bld: 82 mg/dL (ref 70–99)
Potassium: 6 mmol/L — ABNORMAL HIGH (ref 3.5–5.1)
Sodium: 138 mmol/L (ref 135–145)

## 2020-03-26 LAB — RHEUMATOID FACTOR: Rheumatoid fact SerPl-aCnc: <10 IU/mL (ref 0.0–13.9)

## 2020-03-26 LAB — MAGNESIUM: Magnesium: 2.2 mg/dL (ref 1.7–2.4)

## 2020-03-26 MED ORDER — MORPHINE SULFATE (PF) 2 MG/ML IV SOLN
2.0000 mg | Freq: Once | INTRAVENOUS | Status: DC
  Filled 2020-03-26 (×2): qty 1

## 2020-03-26 MED ORDER — GABAPENTIN 300 MG PO CAPS
300.0000 mg | ORAL_CAPSULE | Freq: Every day | ORAL | Status: DC
  Administered 2020-03-27 – 2020-04-03 (×8): 300 mg via ORAL
  Filled 2020-03-26 (×8): qty 1

## 2020-03-26 MED ORDER — SODIUM ZIRCONIUM CYCLOSILICATE 10 G PO PACK
10.0000 g | PACK | Freq: Every day | ORAL | Status: DC
  Administered 2020-03-27 – 2020-04-04 (×7): 10 g via ORAL
  Filled 2020-03-26 (×10): qty 1

## 2020-03-26 MED ORDER — MORPHINE SULFATE (PF) 2 MG/ML IV SOLN
2.0000 mg | Freq: Once | INTRAVENOUS | Status: AC
  Administered 2020-03-26: 2 mg via INTRAVENOUS

## 2020-03-26 MED ORDER — GABAPENTIN 300 MG PO CAPS
300.0000 mg | ORAL_CAPSULE | Freq: Once | ORAL | Status: AC
  Administered 2020-03-26: 300 mg via ORAL
  Filled 2020-03-26: qty 1

## 2020-03-26 NOTE — Progress Notes (Signed)
   03/26/20 1523  Hand-Off documentation  Handoff Given Given to shift RN/LPN  Report given to (Full Name) Claiborne Billings Isenhour  Handoff Received Received from shift RN/LPN  Report received from (Full Name) Sherren Mocha RN  Vital Signs  Temp 98.5 F (36.9 C)  Temp Source Oral  Pulse Rate 85  Resp 13  BP (!) 154/77  BP Location Right Arm  BP Method Automatic  Patient Position (if appropriate) Lying  Oxygen Therapy  SpO2 99 %  O2 Device Room Air  Pain Assessment  Pain Scale 0-10  Pain Score 0  During Hemodialysis Assessment  Blood Flow Rate (mL/min) 150 mL/min  Arterial Pressure (mmHg) 10 mmHg  Venous Pressure (mmHg) 40 mmHg  Transmembrane Pressure (mmHg) 40 mmHg  Ultrafiltration Rate (mL/min) 0 mL/min  Dialysate Flow Rate (mL/min) 600 ml/min  Conductivity: Machine  14  HD Safety Checks Performed Yes  KECN 68.8 KECN  Dialysis Fluid Bolus Normal Saline  Bolus Amount (mL) 250 mL  Intra-Hemodialysis Comments Tx completed;Tolerated well  Post-Hemodialysis Assessment  Rinseback Volume (mL) 250 mL  Dialyzer Clearance Clear  Duration of HD Treatment -hour(s) 3 hour(s)  Hemodialysis Intake (mL) 700 mL  UF Total -Machine (mL) 2200 mL  Net UF (mL) 1500 mL  Tolerated HD Treatment Yes  Post-Hemodialysis Comments treatment completed tolerated well  AVG/AVF Arterial Site Held (minutes) 10 minutes  AVG/AVF Venous Site Held (minutes) 10 minutes  Education / Care Plan  Dialysis Education Provided Yes  Note  Observations no c/o   treatment completed no c/o, Morphine given last 30 minutes of treatment pt became mor alert and talkative, 1500 ml removed, hemostasis obtained from AVF

## 2020-03-26 NOTE — Progress Notes (Signed)
   03/26/20 1200  Hand-Off documentation  Handoff Given Given to shift RN/LPN  Report given to (Full Name) Sherren Mocha RN  Handoff Received Received from shift RN/LPN  Report received from (Full Name) Claiborne Billings Isenhour RN  Vital Signs  Temp 98.5 F (36.9 C)  Temp Source Oral  Pulse Rate 84  Pulse Rate Source Monitor  Resp 14  BP (!) 165/84  BP Location Right Arm  BP Method Automatic  Patient Position (if appropriate) Lying  Oxygen Therapy  SpO2 98 %  O2 Device Room Air  Pain Assessment  Pain Scale 0-10  Pain Score 8  Pain Type Chronic pain  Time-Out for Hemodialysis  What Procedure? hemodialysis  Pt Identifiers(min of two) First/Last Name;MRN/Account#  Correct Site? Yes  Correct Side? Yes  Correct Procedure? Yes  Consents Verified? Yes  Rad Studies Available? N/A  Safety Precautions Reviewed? Yes  Engineer, civil (consulting) Number 2  Station Number 205 (station 2)  UF/Alarm Test Passed  Conductivity: Meter 13.8  Conductivity: Machine  13.8  pH 7.4  Normal Saline Lot Number [id number]  Dialyzer Lot Number [id number]  Disposable Set Lot Number [id number]  Dialysate Acid Bath Lot Number [id number]  Dialysate HCO3 Bath Lot Number [id number]  Machine Temperature 98.6 F (37 C)  Musician and Audible Yes  Blood Lines Intact and Secured Yes  Pre Treatment Patient Checks  Vascular access used during treatment Fistula  Hepatitis B Surface Antigen Results Negative  Date Hepatitis B Surface Antigen Drawn 10/27/19  Date Hepatitis B Surface Antibody Drawn 12/06/19  Hemodialysis Consent Verified Yes  Hemodialysis Standing Orders Initiated Yes  ECG (Telemetry) Monitor On Yes  Prime Ordered Normal Saline  Length of  DialysisTreatment -hour(s) 3 Hour(s)  Dialysis Treatment Comments arrived trying to get comfortable in the bed  Dialyzer Elisio 17H NR  Dialysate 2K;2.5 Ca  Dialysis Anticoagulant None  Dialysate Flow Ordered 600  Blood Flow Rate Ordered 400 mL/min  Ultrafiltration  Goal 1.5 Liters  Dialysis Blood Pressure Support Ordered Normal Saline   Pt arrived to unit alert, no s/s of distress only with movement, receiving dialysis due to high potassium, stable for dialysis

## 2020-03-26 NOTE — Progress Notes (Signed)
[facility]  ROUNDING NOTE   Subjective:   K 6  Complains of leg pains  Objective:  Vital signs in last 24 hours:  Temp:  [97.7 F (36.5 C)-98.5 F (36.9 C)] 98.5 F (36.9 C) (04/04 0452) Pulse Rate:  [63-80] 79 (04/04 0452) Resp:  [16-18] 16 (04/04 0452) BP: (134-156)/(65-87) 134/65 (04/04 0452) SpO2:  [96 %-100 %] 96 % (04/04 0452)  Weight change:  Filed Weights   03/22/20 1609 03/23/20 0727 03/24/20 0920  Weight: 91 kg 91 kg 91 kg    Intake/Output: I/O last 3 completed shifts: In: 360 [P.O.:360] Out: 0    Intake/Output this shift:  No intake/output data recorded.  Physical Exam: General: No acute distress  Head: Normocephalic, atraumatic. Moist oral mucosal membranes  Eyes: Anicteric  Neck: Supple, trachea midline  Lungs:  Clear to auscultation, normal effort  Heart: regular  Abdomen:  Soft, nontender, bowel sounds present  Extremities: No peripheral edema.  Neurologic: Awake, alert, following commands  Skin: No lesions  Access: LUE AVF    Basic Metabolic Panel: Recent Labs  Lab 03/22/20 1752 03/22/20 1752 03/23/20 1200 03/24/20 0457 03/25/20 0522 03/26/20 0441  NA 137  --   --  141 138 138  K 5.7*  --   --  5.0 5.1 6.0*  CL 92*  --   --  94* 92* 95*  CO2 24  --   --  28 30 29   GLUCOSE 75  --   --  121* 93 82  BUN 81*  --   --  61* 47* 62*  CREATININE 13.32*  --   --  10.11* 8.15* 9.97*  CALCIUM 8.4*   < >  --  9.0 9.0 9.0  MG  --   --   --  2.2 2.2 2.2  PHOS  --   --  10.0* 7.1*  --   --    < > = values in this interval not displayed.    Liver Function Tests: Recent Labs  Lab 03/22/20 1752  AST 12*  ALT 13  ALKPHOS 62  BILITOT 1.0  PROT 6.8  ALBUMIN 3.1*   No results for input(s): LIPASE, AMYLASE in the last 168 hours. No results for input(s): AMMONIA in the last 168 hours.  CBC: Recent Labs  Lab 03/22/20 1752 03/24/20 0457 03/25/20 0522 03/26/20 0441  WBC 8.1 7.4 6.0 5.6  NEUTROABS 6.3  --   --   --   HGB  11.0* 11.2* 11.0* 10.4*  HCT 33.6* 33.4* 33.4* 31.2*  MCV 95.7 93.6 95.7 94.5  PLT 175 195 182 181    Cardiac Enzymes: No results for input(s): CKTOTAL, CKMB, CKMBINDEX, TROPONINI in the last 168 hours.  BNP: Invalid input(s): POCBNP  CBG: Recent Labs  Lab 03/24/20 0956  GLUCAP 95    Microbiology: Results for orders placed or performed during the hospital encounter of 03/22/20  SARS CORONAVIRUS 2 (TAT 6-24 HRS) Nasopharyngeal Nasopharyngeal Swab     Status: None   Collection Time: 03/23/20  8:03 PM   Specimen: Nasopharyngeal Swab  Result Value Ref Range Status   SARS Coronavirus 2 NEGATIVE NEGATIVE Final    Comment: (NOTE) SARS-CoV-2 target nucleic acids are NOT DETECTED. The SARS-CoV-2 RNA is generally detectable in upper and lower respiratory specimens during the acute phase of infection. Negative results do not preclude SARS-CoV-2 infection, do not rule out co-infections with other pathogens, and should not be used as the sole basis for treatment or other patient  management decisions. Negative results must be combined with clinical observations, patient history, and epidemiological information. The expected result is Negative. Fact Sheet for Patients: SugarRoll.be Fact Sheet for Healthcare Providers: https://www.woods-mathews.com/ This test is not yet approved or cleared by the [place] FDA and  has been authorized for detection and/or diagnosis of SARS-CoV-2 by FDA under an Emergency Use Authorization (EUA). This EUA will remain  in effect (meaning this test can be used) for the duration of the COVID-19 declaration under Section 56 4(b)(1) of the Act, 21 U.S.C. section 360bbb-3(b)(1), unless the authorization is terminated or revoked sooner. Performed at [facility], [place] [address] [place], [place] [address]     Coagulation Studies: No results for input(s): LABPROT, INR in the last 72  hours.  Urinalysis: No results for input(s): COLORURINE, LABSPEC, PHURINE, GLUCOSEU, HGBUR, BILIRUBINUR, KETONESUR, PROTEINUR, UROBILINOGEN, NITRITE, LEUKOCYTESUR in the last 72 hours.  Invalid input(s): APPERANCEUR    Imaging: DG Chest 2 View  Result Date: 03/24/2020 CLINICAL DATA:  Screening for TB EXAM: CHEST - 2 VIEW COMPARISON:  02/10/2020, CT 01/25/2019 FINDINGS: Rotated patient. Enlarged cardiomediastinal silhouette with vascular congestion. Mild diffuse chronic appearing interstitial opacity. Possible trace right pleural effusion. No pneumothorax. IMPRESSION: 1. No focal pneumonia. 2. Cardiomegaly with vascular congestion and suspected trace right pleural effusion Electronically Signed   By: Donavan Foil M.D.   On: 03/24/2020 18:30     Medications:    . amLODipine  10 mg Oral QHS  . gabapentin  300 mg Oral QHS  . heparin injection (subcutaneous)  5,000 Units Subcutaneous Q8H  . hydrALAZINE  50 mg Oral Q8H  . lidocaine  2 patch Transdermal BID  . ramipril  10 mg Oral Daily  . sevelamer carbonate  2,400 mg Oral TID WC  . sodium zirconium cyclosilicate  10 g Oral Daily   HYDROcodone-acetaminophen, labetalol, ondansetron **OR** ondansetron (ZOFRAN) IV, senna-docusate  Assessment/ Plan:   [name] is a 74 y.o. black male with end-stage renal disease on hemodialysis, hypertension, hyperlipidemia, degenerative disc disease, admitted to [facility] on 03/22/2020 for Back pain [M54.9] Intractable back pain [M54.9] Acute midline low back pain without sciatica [M54.5]  [facility] [address] MWF  1.  ESRD on HD MWF: hyperkalemia  - Dialysis to be preponed for today due to hyperkalemia. Orders prepared.  - Change to renal diet/low potassium diet  2. Hypertension:  - ramipril, amlodipine and hydralazine.   3. Secondary Hyperparathyroidism:  - sevelamer with meals.   4. Anemia with chronic kidney disease: hemoglobin 10.4 No indication for EPO at this time.    5. Chronic back pain: undergoing work up. L2-L3 for biopsy on Monday by interventional radiology. - Neurosurgery consulted.    LOS: 3 Brian Fleming 4/4/20219:45 AM

## 2020-03-26 NOTE — Progress Notes (Signed)
Patient hollering out c/o of pain in left leg, rolling around in bed from left to right, oral pain medication given prior to dialysis treatment, RN made aware

## 2020-03-26 NOTE — Progress Notes (Signed)
PROGRESS NOTE    Brian Fleming  GXQ:[id number] DOB: [birth year] DOA: 03/22/2020 PCP: [facility]    Assessment & Plan:   Principal Problem:   Intractable back pain Active Problems:   Essential hypertension   End stage renal disease on dialysis ([place])   Hyperkalemia   Atrial flutter (HCC)   Back pain    Brian Fleming is a 74 y.o. AA male with medical history significant for hypertension, A. fib on Eliquis, ESRD on HD MWF, with a history of chronic back pain, hospitalized in February 2021 for intractable back pain who returns to the emergency room with a complaint of back pain that has been worse over the past 3 days.  Denied weakness in the extremities or in the groin area, bladder or bowel problems.   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    Essential hypertension -Continue home meds of amlodipine, hydralazine and ramipril    End stage renal disease on dialysis MWF(HCC) Secondary Hyperparathyroidism   Hyperkalemia --pt missed dialysis 2/2 back pain -Patient received a dose of Veltassa in the ER.   -Nephrology consult for continuation of dialysis PLAN: --dialysis today due to hyperkalemia -Continue  sevelamer --Start Lokelma    Atrial flutter ([place]) -Currently rate controlled.   --Hold apixaban for upcoming IR biopsy on Monday  Anemia with chronic kidney disease No indication for EPO at this time.    DVT prophylaxis: Heparin SQ Code Status: Full code  Family Communication:  Disposition Plan: Discharge after Monday after IR biopsy and neurosurgery workup of back pain.  Family wants pt to be placed into "family care home", TOC is working on placement.   Subjective and Interval History:  Pt alternating between hollering complaining of pain and sleeping comfortably.  Reported pain meds helped his pain some.  No fever, dyspnea, N/V/D.   Objective: Vitals:   03/26/20 1345 03/26/20 1400 03/26/20 1415 03/26/20 1430  BP: 135/80 139/83 (!) 155/78 (!) 160/87  Pulse: 87 87 86 86  Resp: 17 16 13 16   Temp:      TempSrc:      SpO2: 100% 99% 100% 99%  Weight:      Height:        Intake/Output Summary (Last 24 hours) at 03/26/2020 1444 Last data filed at 03/26/2020 1145 Gross per 24 hour  Intake 360 ml  Output 0 ml  Net 360 ml   Filed Weights   03/22/20 1609 03/23/20 0727 03/24/20 0920  Weight: 91 kg 91 kg 91 kg    Examination:   Constitutional: alternating between sleeping and hollering in pain HEENT: conjunctivae and lids normal, EOMI CV: RRR no M,R,G. Distal pulses +2.  No cyanosis.   RESP: CTA B/L, normal respiratory effort  GI: +BS, NTND Extremities: No effusions, edema, in BLE MSK: deformed joints in left hand,  SKIN: warm, dry and  intact Neuro: II - XII grossly intact.  Sensation intact   Data Reviewed: I have personally reviewed following labs and imaging studies  CBC: Recent Labs  Lab 03/22/20 1752 03/24/20 0457 03/25/20 0522 03/26/20 0441  WBC 8.1 7.4 6.0 5.6  NEUTROABS 6.3  --   --   --   HGB 11.0* 11.2* 11.0* 10.4*  HCT 33.6* 33.4* 33.4* 31.2*  MCV 95.7 93.6 95.7 94.5  PLT 175 195 182 151   Basic Metabolic Panel: Recent Labs  Lab 03/22/20 1752  03/23/20 1200 03/24/20 0457 03/25/20 0522 03/26/20 0441  NA 137  --  141 138 138  K 5.7*  --  5.0 5.1 6.0*  CL 92*  --  94* 92* 95*  CO2 24  --  28 30 29   GLUCOSE 75  --  121* 93 82  BUN 81*  --  61* 47* 62*  CREATININE 13.32*  --  10.11* 8.15* 9.97*  CALCIUM 8.4*  --  9.0 9.0 9.0  MG  --   --  2.2 2.2 2.2  PHOS  --  10.0* 7.1*  --   --    GFR: Estimated Creatinine Clearance: 7.4 mL/min (A) (by C-G formula based on SCr of 9.97 mg/dL (H)). Liver Function Tests: Recent Labs  Lab 03/22/20 1752  AST 12*  ALT 13  ALKPHOS 62  BILITOT 1.0  PROT 6.8  ALBUMIN 3.1*   No results for input(s): LIPASE, AMYLASE in the last 168 hours. No results for input(s): AMMONIA in the last 168 hours. Coagulation Profile: No results for input(s): INR, PROTIME in the last 168 hours. Cardiac Enzymes: No results for input(s): CKTOTAL, CKMB, CKMBINDEX, TROPONINI in the last 168 hours. BNP (last 3 results) No results for input(s): PROBNP in the last 8760 hours. HbA1C: No results for input(s): HGBA1C in the last 72 hours. CBG: Recent Labs  Lab 03/24/20 0956  GLUCAP 95   Lipid Profile: No results for input(s): CHOL, HDL, LDLCALC, TRIG, CHOLHDL, LDLDIRECT in the last 72 hours. Thyroid Function Tests: No results for input(s): TSH, T4TOTAL, FREET4, T3FREE, THYROIDAB in the last 72 hours. Anemia Panel: No results for input(s): VITAMINB12, FOLATE, FERRITIN, TIBC, IRON, RETICCTPCT in the last 72 hours. Sepsis Labs: No results for input(s): PROCALCITON, LATICACIDVEN in the last 168 hours.  Recent Results (from the past 240 hour(s))  SARS CORONAVIRUS 2 (TAT 6-24 HRS) Nasopharyngeal Nasopharyngeal Swab     Status: None   Collection Time: 03/23/20  8:03 PM   Specimen: Nasopharyngeal Swab  Result Value Ref Range Status   SARS Coronavirus 2 NEGATIVE NEGATIVE Final    Comment: (NOTE) SARS-CoV-2 target nucleic acids are NOT DETECTED. The SARS-CoV-2 RNA is generally detectable in upper and  lower respiratory specimens during the acute phase of infection. Negative results do not preclude SARS-CoV-2 infection, do not rule out co-infections with other pathogens, and should not be used as the sole basis for treatment or other patient management decisions. Negative results must be combined with clinical observations, patient history, and epidemiological information. The expected result is Negative. Fact Sheet for Patients: SugarRoll.be Fact Sheet for Healthcare Providers: https://www.woods-mathews.com/ This test is not yet approved or cleared by the [place] FDA and  has been authorized for detection and/or diagnosis of SARS-CoV-2 by FDA under an Emergency Use Authorization (EUA). This EUA will remain  in effect (meaning this test can be used) for the duration of the COVID-19 declaration under Section 56 4(b)(1) of the Act, 21 U.S.C. section 360bbb-3(b)(1), unless the  authorization is terminated or revoked sooner. Performed at [facility], [place] [address] [place], [place] [address]       Radiology Studies: DG Chest 2 View  Result Date: 03/24/2020 CLINICAL DATA:  Screening for TB EXAM: CHEST - 2 VIEW COMPARISON:  02/10/2020, CT 01/25/2019 FINDINGS: Rotated patient. Enlarged cardiomediastinal silhouette with vascular congestion. Mild diffuse chronic appearing interstitial opacity. Possible trace right pleural effusion. No pneumothorax. IMPRESSION: 1. No focal pneumonia. 2. Cardiomegaly with vascular congestion and suspected trace right pleural effusion Electronically Signed   By: Donavan Foil M.D.   On: 03/24/2020 18:30     Scheduled Meds: . amLODipine  10 mg Oral QHS  . gabapentin  300 mg Oral QHS  . heparin injection (subcutaneous)  5,000 Units Subcutaneous Q8H  . hydrALAZINE  50 mg Oral Q8H  . lidocaine  2 patch Transdermal BID  . ramipril  10 mg Oral Daily  . sevelamer carbonate  2,400 mg Oral TID WC  . sodium  zirconium cyclosilicate  10 g Oral Daily   Continuous Infusions:   LOS: 3 days     Enzo Bi, MD [facility] If 7PM-7AM, please contact night-coverage 03/26/2020, 2:44 PM

## 2020-03-26 NOTE — Plan of Care (Signed)
Continuing with plan of care. 

## 2020-03-27 ENCOUNTER — Inpatient Hospital Stay: Payer: Medicare Other

## 2020-03-27 LAB — BASIC METABOLIC PANEL
Anion gap: 11 (ref 5–15)
BUN: 36 mg/dL — ABNORMAL HIGH (ref 8–23)
CO2: 31 mmol/L (ref 22–32)
Calcium: 9.3 mg/dL (ref 8.9–10.3)
Chloride: 97 mmol/L — ABNORMAL LOW (ref 98–111)
Creatinine, Ser: 7.3 mg/dL — ABNORMAL HIGH (ref 0.61–1.24)
GFR calc Af Amer: 8 mL/min — ABNORMAL LOW (ref >60)
GFR calc non Af Amer: 7 mL/min — ABNORMAL LOW (ref >60)
Glucose, Bld: 72 mg/dL (ref 70–99)
Potassium: 5.2 mmol/L — ABNORMAL HIGH (ref 3.5–5.1)
Sodium: 139 mmol/L (ref 135–145)

## 2020-03-27 LAB — PHOSPHORUS: Phosphorus: 5.8 mg/dL — ABNORMAL HIGH (ref 2.5–4.6)

## 2020-03-27 LAB — CBC
HCT: 33.6 % — ABNORMAL LOW (ref 39.0–52.0)
Hemoglobin: 10.8 g/dL — ABNORMAL LOW (ref 13.0–17.0)
MCH: 31.6 pg (ref 26.0–34.0)
MCHC: 32.1 g/dL (ref 30.0–36.0)
MCV: 98.2 fL (ref 80.0–100.0)
Platelets: 171 10*3/uL (ref 150–400)
RBC: 3.42 MIL/uL — ABNORMAL LOW (ref 4.22–5.81)
RDW: 15.2 % (ref 11.5–15.5)
WBC: 4.5 10*3/uL (ref 4.0–10.5)
nRBC: 0 % (ref 0.0–0.2)

## 2020-03-27 LAB — MAGNESIUM: Magnesium: 2.1 mg/dL (ref 1.7–2.4)

## 2020-03-27 MED ORDER — APIXABAN 5 MG PO TABS
5.0000 mg | ORAL_TABLET | Freq: Two times a day (BID) | ORAL | Status: DC
  Administered 2020-03-27 – 2020-04-04 (×15): 5 mg via ORAL
  Filled 2020-03-27 (×15): qty 1

## 2020-03-27 NOTE — Progress Notes (Signed)
[facility]  ROUNDING NOTE   Subjective:   Hemodialysis treatment yesterday due to hyperkalemia. Tolerated treatment well.   Neurosurgery recommended back brace.   IR was unable to do disc aspiration due to patient's inability to lay flat.   Patient states he does not want dialysis today. He states he will wait until tomorrow.   Objective:  Vital signs in last 24 hours:  Temp:  [97.6 F (36.4 C)-98.4 F (36.9 C)] 97.9 F (36.6 C) (04/05 1133) Pulse Rate:  [82-87] 85 (04/05 1133) Resp:  [16-20] 16 (04/05 1133) BP: (127-189)/(60-93) 127/60 (04/05 1133) SpO2:  [94 %-99 %] 98 % (04/05 1133)  Weight change:  Filed Weights   03/22/20 1609 03/23/20 0727 03/24/20 0920  Weight: 91 kg 91 kg 91 kg    Intake/Output: I/O last 3 completed shifts: In: 240 [P.O.:240] Out: 1500 [Other:1500]   Intake/Output this shift:  No intake/output data recorded.  Physical Exam: General: No acute distress, laying in bed  Head: Normocephalic, atraumatic. Moist oral mucosal membranes  Eyes: Anicteric  Neck: Supple, trachea midline  Lungs:  Clear to auscultation, normal effort  Heart: regular  Abdomen:  Soft, nontender, bowel sounds present  Extremities: No peripheral edema.  Neurologic: Awake, alert, following commands  Skin: No lesions  Access: LUE AVF    Basic Metabolic Panel: Recent Labs  Lab 03/22/20 1752 03/22/20 1752 03/23/20 1200 03/24/20 0457 03/24/20 0457 03/25/20 0522 03/26/20 0441 03/27/20 0528  NA 137  --   --  141  --  138 138 139  K 5.7*  --   --  5.0  --  5.1 6.0* 5.2*  CL 92*  --   --  94*  --  92* 95* 97*  CO2 24  --   --  28  --  30 29 31   GLUCOSE 75  --   --  121*  --  93 82 72  BUN 81*  --   --  61*  --  47* 62* 36*  CREATININE 13.32*  --   --  10.11*  --  8.15* 9.97* 7.30*  CALCIUM 8.4*   < >  --  9.0   < > 9.0 9.0 9.3  MG  --   --   --  2.2  --  2.2 2.2 2.1  PHOS  --   --  10.0* 7.1*  --   --   --  5.8*   < > = values in this interval not  displayed.    Liver Function Tests: Recent Labs  Lab 03/22/20 1752  AST 12*  ALT 13  ALKPHOS 62  BILITOT 1.0  PROT 6.8  ALBUMIN 3.1*   No results for input(s): LIPASE, AMYLASE in the last 168 hours. No results for input(s): AMMONIA in the last 168 hours.  CBC: Recent Labs  Lab 03/22/20 1752 03/24/20 0457 03/25/20 0522 03/26/20 0441 03/27/20 0528  WBC 8.1 7.4 6.0 5.6 4.5  NEUTROABS 6.3  --   --   --   --   HGB 11.0* 11.2* 11.0* 10.4* 10.8*  HCT 33.6* 33.4* 33.4* 31.2* 33.6*  MCV 95.7 93.6 95.7 94.5 98.2  PLT 175 195 182 181 171    Cardiac Enzymes: No results for input(s): CKTOTAL, CKMB, CKMBINDEX, TROPONINI in the last 168 hours.  BNP: Invalid input(s): POCBNP  CBG: Recent Labs  Lab 03/24/20 0956  GLUCAP 95    Microbiology: Results for orders placed or performed during the hospital encounter of 03/22/20  SARS  CORONAVIRUS 2 (TAT 6-24 HRS) Nasopharyngeal Nasopharyngeal Swab     Status: None   Collection Time: 03/23/20  8:03 PM   Specimen: Nasopharyngeal Swab  Result Value Ref Range Status   SARS Coronavirus 2 NEGATIVE NEGATIVE Final    Comment: (NOTE) SARS-CoV-2 target nucleic acids are NOT DETECTED. The SARS-CoV-2 RNA is generally detectable in upper and lower respiratory specimens during the acute phase of infection. Negative results do not preclude SARS-CoV-2 infection, do not rule out co-infections with other pathogens, and should not be used as the sole basis for treatment or other patient management decisions. Negative results must be combined with clinical observations, patient history, and epidemiological information. The expected result is Negative. Fact Sheet for Patients: SugarRoll.be Fact Sheet for Healthcare Providers: https://www.woods-mathews.com/ This test is not yet approved or cleared by the [place] FDA and  has been authorized for detection and/or diagnosis of SARS-CoV-2 by FDA under an  Emergency Use Authorization (EUA). This EUA will remain  in effect (meaning this test can be used) for the duration of the COVID-19 declaration under Section 56 4(b)(1) of the Act, 21 U.S.C. section 360bbb-3(b)(1), unless the authorization is terminated or revoked sooner. Performed at [facility], [place] [address], [place],  [address]     Coagulation Studies: No results for input(s): LABPROT, INR in the last 72 hours.  Urinalysis: No results for input(s): COLORURINE, LABSPEC, PHURINE, GLUCOSEU, HGBUR, BILIRUBINUR, KETONESUR, PROTEINUR, UROBILINOGEN, NITRITE, LEUKOCYTESUR in the last 72 hours.  Invalid input(s): APPERANCEUR    Imaging: No results found.   Medications:    . amLODipine  10 mg Oral QHS  . gabapentin  300 mg Oral QHS  . hydrALAZINE  50 mg Oral Q8H  . lidocaine  2 patch Transdermal BID  . ramipril  10 mg Oral Daily  . sevelamer carbonate  2,400 mg Oral TID WC  . sodium zirconium cyclosilicate  10 g Oral Daily   HYDROcodone-acetaminophen, labetalol, ondansetron **OR** ondansetron (ZOFRAN) IV, senna-docusate  Assessment/ Plan:   Brian Fleming is a 74 y.o. black male with end-stage renal disease on hemodialysis, hypertension, hyperlipidemia, degenerative disc disease, admitted to [facility] on 03/22/2020 for Back pain [M54.9] Intractable back pain [M54.9] Acute midline low back pain without sciatica [M54.5]  [facility] [address] MWF  1.  ESRD on HD MWF: with hyperkalemia. Dialysis yesterday. Next treatment for tomorrow.  - renal diet/low potassium diet  2. Hypertension: elevated due to pain.  - ramipril, amlodipine and hydralazine.   3. Secondary Hyperparathyroidism:  - sevelamer with meals.   4. Anemia with chronic kidney disease: hemoglobin 10.8 No indication for EPO at this time.    LOS: 4 Javel Hersh 4/5/20214:46 PM

## 2020-03-27 NOTE — Progress Notes (Signed)
Orthopedic Tech Progress Note Patient Details:  Tarell Schollmeyer Aug 25, 1946 [id number] Called order to [facility] for an LSO Patient ID: [name], male   DOB: 11/30/1946, 74 y.o.   MRN: [id number]   Janit Pagan 03/27/2020, 4:49 PM

## 2020-03-27 NOTE — Consult Note (Signed)
Neurosurgery-New Consultation Evaluation 03/27/2020 Agastya Meister [id number]  Identifying Statement: Daequan Kozma is a 74 y.o. male from [place] [address] with back and leg pain  Physician Requesting Consultation: Dr. Enzo Bi  History of Present Illness: Mr. Trigg is admitted to the hospital with ongoing symptoms of back and leg pain he does have a past medical history significant for significant vascular disease and he is on Eliquis.  He additionally has end-stage renal disease and is on dialysis.  He was hospitalized in February with severe back pain return to the emergency department yesterday with concern that he had worsening back pain over the previous 3 days.  He denies any weakness, bowel or bladder issues, or numbness. He states he has pain in both legs. He has some difficulty with his toes due to gout. He doesn't know exactly when his pain worsened or started. He is otherwise a poor historian.  MRI has been obtained which shows concern for changes at the L2-3 level consistent with possible infection versus inflammatory changes.  He is scheduled for biopsy of the disc space today.    Past Medical History:  Past Medical History:  Diagnosis Date   Atrial flutter ([place])    a. dates back to at least 2013; b. CHADS2VASc 5 (HTN, age x 1, stroke x 2, vascular disease); c. started on Eliquis 06/2019   DJD (degenerative joint disease)    End stage renal disease on dialysis ([place])    Hyperlipidemia    Hypertension     Social History: Social History   Socioeconomic History   Marital status: Widowed    Spouse name: Not on file   Number of children: Not on file   Years of education: Not on file   Highest education level: Not on file  Occupational History   Not on file  Tobacco Use   Smoking status: Current Every Day Smoker    Packs/day: 0.25    Types: Cigarettes   Smokeless tobacco: Never Used  Substance and Sexual Activity   Alcohol use: No   Drug use: No   Sexual  activity: Not on file  Other Topics Concern   Not on file  Social History Narrative   Not on file   Social Determinants of Health   Financial Resource Strain:    Difficulty of Paying Living Expenses:   Food Insecurity:    Worried About Charity fundraiser in the Last Year:    Arboriculturist in the Last Year:   Transportation Needs:    Film/video editor (Medical):    Lack of Transportation (Non-Medical):   Physical Activity:    Days of Exercise per Week:    Minutes of Exercise per Session:   Stress:    Feeling of Stress :   Social Connections:    Frequency of Communication with Friends and Family:    Frequency of Social Gatherings with Friends and Family:    Attends Religious Services:    Active Member of Clubs or Organizations:    Attends Music therapist:    Marital Status:   Intimate Partner Violence:    Fear of Current or Ex-Partner:    Emotionally Abused:    Physically Abused:    Sexually Abused:   \ Family History: Family History  Problem Relation Age of Onset   Hypertension Brother     Review of Systems:  Review of Systems - General ROS: Negative Psychological ROS: Negative Ophthalmic ROS: Negative ENT ROS: Negative Hematological and  Lymphatic ROS: Negative  Endocrine ROS: Negative Respiratory ROS: Negative Cardiovascular ROS: Negative Gastrointestinal ROS: Negative Genito-Urinary ROS: Negative Musculoskeletal ROS: Positive for back pain Neurological ROS: Positive for leg pain Dermatological ROS: Negative  Physical Exam: BP 127/60 (BP Location: Right Arm)   Pulse 85   Temp 97.9 F (36.6 C) (Oral)   Resp 16   Ht 5\' 8"  (1.727 m)   Wt 91 kg   SpO2 98%   BMI 30.50 kg/m  Body mass index is 30.5 kg/m. Body surface area is 2.09 meters squared. General appearance: Alert, has difficulty following questions, in no acute distress Head: Normocephalic, atraumatic Eyes: Normal, EOM intact Oropharynx: Moist without lesions Ext: Edema  noted in BLE  Neurologic exam:  Mental status: alertness: alert, affect: normal Speech: fluent and clear Motor:strength symmetric 5/5 in bilateral hip flexion, knee flexion, knee extension, dorsiflexion, plantarflexion, EHL not tested due to gouty changes Sensory: intact to light touch in bilateral lower extremities Reflexes: 1+ at bilateral patella Gait: Not tested  Laboratory: Results for orders placed or performed during the hospital encounter of 03/22/20  SARS CORONAVIRUS 2 (TAT 6-24 HRS) Nasopharyngeal Nasopharyngeal Swab   Specimen: Nasopharyngeal Swab  Result Value Ref Range   SARS Coronavirus 2 NEGATIVE NEGATIVE  CBC with Differential  Result Value Ref Range   WBC 8.1 4.0 - 10.5 K/uL   RBC 3.51 (L) 4.22 - 5.81 MIL/uL   Hemoglobin 11.0 (L) 13.0 - 17.0 g/dL   HCT 33.6 (L) 39.0 - 52.0 %   MCV 95.7 80.0 - 100.0 fL   MCH 31.3 26.0 - 34.0 pg   MCHC 32.7 30.0 - 36.0 g/dL   RDW 15.4 11.5 - 15.5 %   Platelets 175 150 - 400 K/uL   nRBC 0.0 0.0 - 0.2 %   Neutrophils Relative % 76 %   Neutro Abs 6.3 1.7 - 7.7 K/uL   Lymphocytes Relative 9 %   Lymphs Abs 0.7 0.7 - 4.0 K/uL   Monocytes Relative 11 %   Monocytes Absolute 0.9 0.1 - 1.0 K/uL   Eosinophils Relative 2 %   Eosinophils Absolute 0.1 0.0 - 0.5 K/uL   Basophils Relative 1 %   Basophils Absolute 0.0 0.0 - 0.1 K/uL   Immature Granulocytes 1 %   Abs Immature Granulocytes 0.05 0.00 - 0.07 K/uL  Comprehensive metabolic panel  Result Value Ref Range   Sodium 137 135 - 145 mmol/L   Potassium 5.7 (H) 3.5 - 5.1 mmol/L   Chloride 92 (L) 98 - 111 mmol/L   CO2 24 22 - 32 mmol/L   Glucose, Bld 75 70 - 99 mg/dL   BUN 81 (H) 8 - 23 mg/dL   Creatinine, Ser 13.32 (H) 0.61 - 1.24 mg/dL   Calcium 8.4 (L) 8.9 - 10.3 mg/dL   Total Protein 6.8 6.5 - 8.1 g/dL   Albumin 3.1 (L) 3.5 - 5.0 g/dL   AST 12 (L) 15 - 41 U/L   ALT 13 0 - 44 U/L   Alkaline Phosphatase 62 38 - 126 U/L   Total Bilirubin 1.0 0.3 - 1.2 mg/dL   GFR calc non Af  Amer 3 (L) >60 mL/min   GFR calc Af Amer 4 (L) >60 mL/min   Anion gap 21 (H) 5 - 15  Sedimentation rate  Result Value Ref Range   Sed Rate 86 (H) 0 - 20 mm/hr  C-reactive protein  Result Value Ref Range   CRP 19.1 (H) <1.0 mg/dL  Phosphorus  Result Value  Ref Range   Phosphorus 10.0 (H) 2.5 - 4.6 mg/dL  Basic metabolic panel  Result Value Ref Range   Sodium 141 135 - 145 mmol/L   Potassium 5.0 3.5 - 5.1 mmol/L   Chloride 94 (L) 98 - 111 mmol/L   CO2 28 22 - 32 mmol/L   Glucose, Bld 121 (H) 70 - 99 mg/dL   BUN 61 (H) 8 - 23 mg/dL   Creatinine, Ser 10.11 (H) 0.61 - 1.24 mg/dL   Calcium 9.0 8.9 - 10.3 mg/dL   GFR calc non Af Amer 5 (L) >60 mL/min   GFR calc Af Amer 5 (L) >60 mL/min   Anion gap 19 (H) 5 - 15  CBC  Result Value Ref Range   WBC 7.4 4.0 - 10.5 K/uL   RBC 3.57 (L) 4.22 - 5.81 MIL/uL   Hemoglobin 11.2 (L) 13.0 - 17.0 g/dL   HCT 33.4 (L) 39.0 - 52.0 %   MCV 93.6 80.0 - 100.0 fL   MCH 31.4 26.0 - 34.0 pg   MCHC 33.5 30.0 - 36.0 g/dL   RDW 15.4 11.5 - 15.5 %   Platelets 195 150 - 400 K/uL   nRBC 0.0 0.0 - 0.2 %  Magnesium  Result Value Ref Range   Magnesium 2.2 1.7 - 2.4 mg/dL  Phosphorus  Result Value Ref Range   Phosphorus 7.1 (H) 2.5 - 4.6 mg/dL  Glucose, capillary  Result Value Ref Range   Glucose-Capillary 95 70 - 99 mg/dL  Basic metabolic panel  Result Value Ref Range   Sodium 138 135 - 145 mmol/L   Potassium 5.1 3.5 - 5.1 mmol/L   Chloride 92 (L) 98 - 111 mmol/L   CO2 30 22 - 32 mmol/L   Glucose, Bld 93 70 - 99 mg/dL   BUN 47 (H) 8 - 23 mg/dL   Creatinine, Ser 8.15 (H) 0.61 - 1.24 mg/dL   Calcium 9.0 8.9 - 10.3 mg/dL   GFR calc non Af Amer 6 (L) >60 mL/min   GFR calc Af Amer 7 (L) >60 mL/min   Anion gap 16 (H) 5 - 15  CBC  Result Value Ref Range   WBC 6.0 4.0 - 10.5 K/uL   RBC 3.49 (L) 4.22 - 5.81 MIL/uL   Hemoglobin 11.0 (L) 13.0 - 17.0 g/dL   HCT 33.4 (L) 39.0 - 52.0 %   MCV 95.7 80.0 - 100.0 fL   MCH 31.5 26.0 - 34.0 pg   MCHC 32.9 30.0  - 36.0 g/dL   RDW 15.6 (H) 11.5 - 15.5 %   Platelets 182 150 - 400 K/uL   nRBC 0.0 0.0 - 0.2 %  Magnesium  Result Value Ref Range   Magnesium 2.2 1.7 - 2.4 mg/dL  Rheumatoid factor  Result Value Ref Range   Rhuematoid fact SerPl-aCnc <10.0 0.0 - 13.9 IU/mL  Basic metabolic panel  Result Value Ref Range   Sodium 138 135 - 145 mmol/L   Potassium 6.0 (H) 3.5 - 5.1 mmol/L   Chloride 95 (L) 98 - 111 mmol/L   CO2 29 22 - 32 mmol/L   Glucose, Bld 82 70 - 99 mg/dL   BUN 62 (H) 8 - 23 mg/dL   Creatinine, Ser 9.97 (H) 0.61 - 1.24 mg/dL   Calcium 9.0 8.9 - 10.3 mg/dL   GFR calc non Af Amer 5 (L) >60 mL/min   GFR calc Af Amer 5 (L) >60 mL/min   Anion gap 14 5 - 15  CBC  Result Value Ref Range   WBC 5.6 4.0 - 10.5 K/uL   RBC 3.30 (L) 4.22 - 5.81 MIL/uL   Hemoglobin 10.4 (L) 13.0 - 17.0 g/dL   HCT 31.2 (L) 39.0 - 52.0 %   MCV 94.5 80.0 - 100.0 fL   MCH 31.5 26.0 - 34.0 pg   MCHC 33.3 30.0 - 36.0 g/dL   RDW 15.3 11.5 - 15.5 %   Platelets 181 150 - 400 K/uL   nRBC 0.0 0.0 - 0.2 %  Magnesium  Result Value Ref Range   Magnesium 2.2 1.7 - 2.4 mg/dL  Basic metabolic panel  Result Value Ref Range   Sodium 139 135 - 145 mmol/L   Potassium 5.2 (H) 3.5 - 5.1 mmol/L   Chloride 97 (L) 98 - 111 mmol/L   CO2 31 22 - 32 mmol/L   Glucose, Bld 72 70 - 99 mg/dL   BUN 36 (H) 8 - 23 mg/dL   Creatinine, Ser 7.30 (H) 0.61 - 1.24 mg/dL   Calcium 9.3 8.9 - 10.3 mg/dL   GFR calc non Af Amer 7 (L) >60 mL/min   GFR calc Af Amer 8 (L) >60 mL/min   Anion gap 11 5 - 15  CBC  Result Value Ref Range   WBC 4.5 4.0 - 10.5 K/uL   RBC 3.42 (L) 4.22 - 5.81 MIL/uL   Hemoglobin 10.8 (L) 13.0 - 17.0 g/dL   HCT 33.6 (L) 39.0 - 52.0 %   MCV 98.2 80.0 - 100.0 fL   MCH 31.6 26.0 - 34.0 pg   MCHC 32.1 30.0 - 36.0 g/dL   RDW 15.2 11.5 - 15.5 %   Platelets 171 150 - 400 K/uL   nRBC 0.0 0.0 - 0.2 %  Magnesium  Result Value Ref Range   Magnesium 2.1 1.7 - 2.4 mg/dL  Phosphorus  Result Value Ref Range    Phosphorus 5.8 (H) 2.5 - 4.6 mg/dL   I personally reviewed labs  Imaging: 1. Mild progression of endplate remodeling at T0-1 with slightly increased disc space edema. No edema within the endplates. 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 Correlation with CRP might be helpful. 2. Unchanged severe lumbar spinal canal stenosis and severe bilateral neural foraminal stenosis at L2-3 and L4-5 with marked mass effect on the cauda equina nerve roots at these levels. 3. 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   Impression/Plan:  Mr. Fullilove is here for evaluation of possible discitis in lumbar spine,. He does have some severe stenosis but no weakness on exam. He definitely has back and leg pain but given his co-morbidities and possible infection, no surgery is recommended at this time as decompression alone will likely lead to instability given bony erosion anteriorly at multiple levels and placing hardware for fusion given infection concern and poor bone quality likely carries great risk.    1.  Diagnosis: L2-3 discitis, chronic lumbar stenosis.  2.  Plan -Recommend patient be placed in a LSO brace given the changes seen on the MRI and back pain.  He should wear this at all times when out of bed. -Recommend upright lumbar spine x-rays when brace is on. -Recommend continued medical care of his pain is no surgical intervention recommended at this time given concern for infection. -Agree with biopsy of the displaced to help guide treatment

## 2020-03-27 NOTE — Procedures (Signed)
Pre procedural Dx: Concern for chronic discitis/osteomyelitis. Post procedural Dx: Same  Inability to attempt image guided aspiration of L2-L3 disc due to pt's inability to lie flat on the fluoroscopy table.  Patient does not currently have an IV, though admittedly I am uncertain the patient would be able to tolerate necessary positioning for attempted disc aspiration even with moderate sedation.  Above discussed with Dr. Billie Ruddy and the decision was made NOT to proceed with image guided disc aspiration at this time.  Ronny Bacon, MD Pager #: [phone]

## 2020-03-27 NOTE — Progress Notes (Signed)
PROGRESS NOTE    Brian Fleming  XMI:[id number] DOB: 1946/09/29 DOA: 03/22/2020 PCP: [facility]    Assessment & Plan:   Principal Problem:   Intractable back pain Active Problems:   Essential hypertension   End stage renal disease on dialysis ([facility])   Hyperkalemia   Atrial flutter ([facility])   Back pain   Spinal stenosis   Discitis of lumbar region   Anemia    Brian Fleming is a 74 y.o. AA male with medical history significant for hypertension, A. fib on Eliquis, ESRD on HD MWF, with a history of chronic back pain, hospitalized in February 2021 for intractable back pain who returns to the emergency room with a complaint of back pain that has been worse over the past 3 days.  Denied weakness in the extremities or in the groin area, bladder or bowel problems.   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 would be difficult to exclude --MRI also showed "Unchanged severe lumbar spinal canal stenosis and severe bilateral neural foraminal stenosis at L2-3 and L4-5 with marked mass effect on the cauda equina nerve roots at these levels." -CRP and sed rate elevated --ID consulted PLAN: --IR for disc biopsy today, unsuccessful, pt couldn't tolerate -Lidocaine patch, per pt request --Oxycodone PRN --Neurosurgery consult today, recommended LSO brace to be worn at all times when out of bed --upright lumbar spine xrays after brace is on --No surgical intervention at this time    Essential hypertension -Continue home meds of amlodipine, hydralazine and ramipril    End stage renal disease on dialysis MWF([facility]) Secondary Hyperparathyroidism   Hyperkalemia --pt missed dialysis 2/2 back  pain -Patient received a dose of Veltassa in the ER.   -Nephrology consult for continuation of dialysis PLAN: --dialysis per nephrology -Continue sevelamer --continue Lokelma    Atrial flutter ([place]) -Currently rate controlled.   --resume apixaban   Anemia with chronic kidney disease No indication for EPO at this time.    DVT prophylaxis: MG:NOIBBCW Code Status: Full code  Family Communication:  Disposition Plan: Medically ready for discharge tomorrow after confirming lumbar brace position.  Family wants pt to be placed into "family care home", TOC is working on placement.   Subjective and Interval History:  Pt continued to complain of pain, though was sleeping most of the time.  No fever, N/V/D.  IR attempted biopsy of L2-3, however, pt couldn't lie flat or tolerate the procedure.  Neurosurgery consulted today.   Objective: Vitals:   03/26/20 1958 03/27/20 0605 03/27/20 0828 03/27/20 1133  BP: (!) 189/93 135/80 (!) 173/76 127/60  Pulse: 87 85 82 85  Resp: 20 20  16   Temp: 97.6 F (36.4 C) 98.4 F (36.9 C) 98.2 F (36.8 C) 97.9 F (36.6 C)  TempSrc: Oral Oral Oral Oral  SpO2: 94% 94% 99% 98%  Weight:      Height:        Intake/Output Summary (Last 24 hours) at 03/27/2020 1838 Last data filed at 03/27/2020 1500 Gross per 24 hour  Intake 0 ml  Output 0 ml  Net 0 ml   Filed Weights   03/22/20 1609 03/23/20 0727 03/24/20 0920  Weight: 91 kg 91 kg 91 kg    Examination:   Constitutional: NAD, sleeping, body curled up lying on  left side HEENT: conjunctivae and lids normal, EOMI CV: RRR no M,R,G. Distal pulses +2.  No cyanosis.   RESP: CTA B/L, normal respiratory effort  GI: +BS, NTND Extremities: No effusions, edema, in BLE MSK: deformed joints in left hand,  SKIN: warm, dry and intact Neuro: II - XII grossly intact.  Sensation intact   Data Reviewed: I have personally reviewed following labs and imaging studies  CBC: Recent Labs  Lab 03/22/20 1752  03/24/20 0457 03/25/20 0522 03/26/20 0441 03/27/20 0528  WBC 8.1 7.4 6.0 5.6 4.5  NEUTROABS 6.3  --   --   --   --   HGB 11.0* 11.2* 11.0* 10.4* 10.8*  HCT 33.6* 33.4* 33.4* 31.2* 33.6*  MCV 95.7 93.6 95.7 94.5 98.2  PLT 175 195 182 181 443   Basic Metabolic Panel: Recent Labs  Lab 03/22/20 1752 03/23/20 1200 03/24/20 0457 03/25/20 0522 03/26/20 0441 03/27/20 0528  NA 137  --  141 138 138 139  K 5.7*  --  5.0 5.1 6.0* 5.2*  CL 92*  --  94* 92* 95* 97*  CO2 24  --  28 30 29 31   GLUCOSE 75  --  121* 93 82 72  BUN 81*  --  61* 47* 62* 36*  CREATININE 13.32*  --  10.11* 8.15* 9.97* 7.30*  CALCIUM 8.4*  --  9.0 9.0 9.0 9.3  MG  --   --  2.2 2.2 2.2 2.1  PHOS  --  10.0* 7.1*  --   --  5.8*   GFR: Estimated Creatinine Clearance: 10.2 mL/min (A) (by C-G formula based on SCr of 7.3 mg/dL (H)). Liver Function Tests: Recent Labs  Lab 03/22/20 1752  AST 12*  ALT 13  ALKPHOS 62  BILITOT 1.0  PROT 6.8  ALBUMIN 3.1*   No results for input(s): LIPASE, AMYLASE in the last 168 hours. No results for input(s): AMMONIA in the last 168 hours. Coagulation Profile: No results for input(s): INR, PROTIME in the last 168 hours. Cardiac Enzymes: No results for input(s): CKTOTAL, CKMB, CKMBINDEX, TROPONINI in the last 168 hours. BNP (last 3 results) No results for input(s): PROBNP in the last 8760 hours. HbA1C: No results for input(s): HGBA1C in the last 72 hours. CBG: Recent Labs  Lab 03/24/20 0956  GLUCAP 95   Lipid Profile: No results for input(s): CHOL, HDL, LDLCALC, TRIG, CHOLHDL, LDLDIRECT in the last 72 hours. Thyroid Function Tests: No results for input(s): TSH, T4TOTAL, FREET4, T3FREE, THYROIDAB in the last 72 hours. Anemia Panel: No results for input(s): VITAMINB12, FOLATE, FERRITIN, TIBC, IRON, RETICCTPCT in the last 72 hours. Sepsis Labs: No results for input(s): PROCALCITON, LATICACIDVEN in the last 168 hours.  Recent Results (from the past 240 hour(s))  SARS  CORONAVIRUS 2 (TAT 6-24 HRS) Nasopharyngeal Nasopharyngeal Swab     Status: None   Collection Time: 03/23/20  8:03 PM   Specimen: Nasopharyngeal Swab  Result Value Ref Range Status   SARS Coronavirus 2 NEGATIVE NEGATIVE Final    Comment: (NOTE) SARS-CoV-2 target nucleic acids are NOT DETECTED. The SARS-CoV-2 RNA is generally detectable in upper and lower respiratory specimens during the acute phase of infection. Negative results do not preclude SARS-CoV-2 infection, do not rule out co-infections with other pathogens, and should not be used as the sole basis for treatment or other patient management decisions. Negative results must be combined with clinical observations, patient history, and epidemiological information. The expected result is Negative. Fact Sheet for Patients: SugarRoll.be Fact Sheet  for Healthcare Providers: https://www.woods-mathews.com/ This test is not yet approved or cleared by the [place] and  has been authorized for detection and/or diagnosis of SARS-CoV-2 by FDA under an Emergency Use Authorization (EUA). This EUA will remain  in effect (meaning this test can be used) for the duration of the COVID-19 declaration under Section 56 4(b)(1) of the Act, 21 U.S.C. section 360bbb-3(b)(1), unless the authorization is terminated or revoked sooner. Performed at [facility], [place] [address], [place], [place] [address]       Radiology Studies: No results found.   Scheduled Meds: . amLODipine  10 mg Oral QHS  . gabapentin  300 mg Oral QHS  . hydrALAZINE  50 mg Oral Q8H  . lidocaine  2 patch Transdermal BID  . ramipril  10 mg Oral Daily  . sevelamer carbonate  2,400 mg Oral TID WC  . sodium zirconium cyclosilicate  10 g Oral Daily   Continuous Infusions:   LOS: 4 days     Enzo Bi, MD [facility] If 7PM-7AM, please contact night-coverage 03/27/2020, 6:38 PM

## 2020-03-28 LAB — BASIC METABOLIC PANEL
Anion gap: 18 — ABNORMAL HIGH (ref 5–15)
BUN: 50 mg/dL — ABNORMAL HIGH (ref 8–23)
CO2: 28 mmol/L (ref 22–32)
Calcium: 9.8 mg/dL (ref 8.9–10.3)
Chloride: 95 mmol/L — ABNORMAL LOW (ref 98–111)
Creatinine, Ser: 9.31 mg/dL — ABNORMAL HIGH (ref 0.61–1.24)
GFR calc Af Amer: 6 mL/min — ABNORMAL LOW (ref >60)
GFR calc non Af Amer: 5 mL/min — ABNORMAL LOW (ref >60)
Glucose, Bld: 90 mg/dL (ref 70–99)
Potassium: 5 mmol/L (ref 3.5–5.1)
Sodium: 141 mmol/L (ref 135–145)

## 2020-03-28 LAB — PHOSPHORUS: Phosphorus: 7.3 mg/dL — ABNORMAL HIGH (ref 2.5–4.6)

## 2020-03-28 LAB — CBC
HCT: 32.8 % — ABNORMAL LOW (ref 39.0–52.0)
Hemoglobin: 10.6 g/dL — ABNORMAL LOW (ref 13.0–17.0)
MCH: 31 pg (ref 26.0–34.0)
MCHC: 32.3 g/dL (ref 30.0–36.0)
MCV: 95.9 fL (ref 80.0–100.0)
Platelets: 179 10*3/uL (ref 150–400)
RBC: 3.42 MIL/uL — ABNORMAL LOW (ref 4.22–5.81)
RDW: 15.4 % (ref 11.5–15.5)
WBC: 5.7 10*3/uL (ref 4.0–10.5)
nRBC: 0 % (ref 0.0–0.2)

## 2020-03-28 LAB — MAGNESIUM: Magnesium: 2.2 mg/dL (ref 1.7–2.4)

## 2020-03-28 MED ORDER — SODIUM CHLORIDE 0.9 % IV SOLN
1.0000 g | INTRAVENOUS | Status: DC
  Filled 2020-03-28: qty 1

## 2020-03-28 MED ORDER — VANCOMYCIN HCL IN DEXTROSE 1-5 GM/200ML-% IV SOLN
1000.0000 mg | INTRAVENOUS | Status: DC
  Filled 2020-03-28: qty 200

## 2020-03-28 MED ORDER — VANCOMYCIN HCL 2000 MG/400ML IV SOLN
2000.0000 mg | Freq: Once | INTRAVENOUS | Status: DC
  Filled 2020-03-28: qty 400

## 2020-03-28 NOTE — Progress Notes (Signed)
IV team consulted this evening as patient has two IV antibiotics to be given this evening. I personally attempted to assisted the IV team without success. Patient is not willing to lay on his back and does not stay still for very long. He will not allow [place] to place an IV in his Right hand and right AC was unsuccessful. Per IV team they are not about to place a Midline or Central without the approval from Nephrology. Patient at this point is refusing to be stuck with another IV. I also question him being able to tolerate the positioning needed an invasive line.  I have advised the provider of this at this time.  I will give patient PO pain medication at this time. Safety checks completed and call light placed within reach.

## 2020-03-28 NOTE — Consult Note (Signed)
Pharmacy Antibiotic Note  Brian Fleming is a 74 y.o. male with a history of chronic discitis-osteomyelitis, ESRD on HD with MWF schedule admitted on 03/22/2020 with osteomyelitis. Inflammatory markers elevated. Pharmacy has been consulted for vancomycin and ceftazidime dosing.  Patient received hemodialysis Tuesday 4/6. Biopsy unable to be completed as patient was unable to cooperate due to pain.   Plan: Ceftazidime 1 g q24h in the evening to avoid being given prior to HD. Regimen can be consolidated to 2 g qHD but patient appears to be off MWF schedule right now.   Vancomycin 2 g LD x 1 followed by maintenance regimen of vancomycin 1 g qHD  Levels before dialysis for 3rd maintenance dose   Height: 5\' 8"  (172.7 cm) Weight: 91 kg (200 lb 9.9 oz) IBW/kg (Calculated) : 68.4  Temp (24hrs), Avg:98.1 F (36.7 C), Min:97.6 F (36.4 C), Max:98.5 F (36.9 C)  Recent Labs  Lab 03/24/20 0457 03/25/20 0522 03/26/20 0441 03/27/20 0528 03/28/20 0533  WBC 7.4 6.0 5.6 4.5 5.7  CREATININE 10.11* 8.15* 9.97* 7.30* 9.31*    Estimated Creatinine Clearance: 8 mL/min (A) (by C-G formula based on SCr of 9.31 mg/dL (H)).    No Known Allergies  Antimicrobials this admission: Vancomycin 4/6 >>  Ceftazidime 4/6 >>   Dose adjustments this admission: n/a  Microbiology results: 4/1 SARS-CoV-2: negative  Thank you for allowing pharmacy to be a part of this patient's care.  [place] Resident 03/28/2020 7:03 PM

## 2020-03-28 NOTE — Progress Notes (Signed)
PROGRESS NOTE    Brian Fleming  RNH:[id number] DOB: 1946/04/14 DOA: 03/22/2020 PCP: [facility]    Assessment & Plan:   Principal Problem:   Intractable back pain Active Problems:   Essential hypertension   End stage renal disease on dialysis (HCC)   Hyperkalemia   Atrial flutter (HCC)   Back pain   Spinal stenosis   Discitis of lumbar region   Anemia    Brian Fleming is a 74 y.o. AA male with medical history significant for hypertension, A. fib on Eliquis, ESRD on HD MWF, with a history of chronic back pain, hospitalized in February 2021 for intractable back pain who returns to the emergency room with a complaint of back pain that has been worse over the past 3 days.  Denied weakness in the extremities or in the groin area, bladder or bowel problems.   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 --upright lumbar spine xrays after brace is on --No surgical intervention at this time  --outpatient neurosurgery f/u --ID rec "empirically treat as chronic discitis/osteo with vanco and ceftazidime which can be given during dialysis and follow the ESR and CRP."    Essential hypertension -Continue  home meds of amlodipine, hydralazine and ramipril    End stage renal disease on dialysis MWF(HCC) Secondary Hyperparathyroidism   Hyperkalemia --pt missed dialysis 2/2 back pain -Patient received a dose of Veltassa in the ER.   -Nephrology consult for continuation of dialysis PLAN: --dialysis per nephrology -Continue sevelamer --continue Lokelma    Atrial flutter ([place]) -Currently rate controlled.   --resume apixaban   Anemia with chronic kidney disease No indication for EPO at this time.    DVT prophylaxis: XO:VANVBTY Code Status: Full code  Family Communication:  Disposition Plan: Medically ready for discharge. Pt has a guardian.  Will discharge to SNF vs Group home pending PT eval (pt hasn't been cooperative).   Subjective and Interval History:  Pt reported leg pain eased off a bit.  Refused lumbar back brace due to it hurting his abdomen.  No fever, N/V/D.   Objective: Vitals:   03/28/20 1300 03/28/20 1315 03/28/20 1330 03/28/20 1408  BP: (!) 137/56 (!) 146/73 (!) 147/69 140/65  Pulse: 84 85 85 85  Resp: '12 12 13 16  '$ Temp:   97.6 F (36.4 C) 98.3 F (36.8 C)  TempSrc:   Oral Oral  SpO2: 98% 100% 100% 95%  Weight:      Height:        Intake/Output Summary (Last 24 hours) at 03/28/2020 1725 Last data filed at 03/28/2020 1330 Gross per 24 hour  Intake 0 ml  Output 1984 ml  Net -1984 ml   Filed Weights   03/22/20 1609 03/23/20 0727 03/24/20 0920  Weight: 91 kg 91 kg 91 kg    Examination:  Constitutional: NAD, lethargic, but arousable, body curled up lying on left side HEENT: conjunctivae and lids normal, EOMI CV: RRR no M,R,G. Distal pulses +2.  No cyanosis.   RESP: CTA B/L, normal respiratory effort  GI: +BS, NTND Extremities: No effusions, edema, in BLE MSK: deformed joints in left hand,  SKIN: warm, dry and intact Neuro: II - XII grossly intact.  Sensation intact   Data Reviewed: I have personally reviewed following labs and imaging  studies  CBC: Recent Labs  Lab 03/22/20 1752 03/22/20 1752 03/24/20 0457 03/25/20 0522 03/26/20 0441 03/27/20 0528 03/28/20 0533  WBC 8.1   < > 7.4 6.0 5.6 4.5 5.7  NEUTROABS 6.3  --   --   --   --   --   --   HGB 11.0*   < > 11.2* 11.0* 10.4* 10.8* 10.6*  HCT 33.6*   < > 33.4* 33.4* 31.2* 33.6* 32.8*  MCV 95.7   < > 93.6 95.7 94.5 98.2 95.9  PLT 175   < > 195 182 181 171 179   < > = values in this interval not displayed.   Basic Metabolic Panel: Recent Labs  Lab 03/22/20 1752 03/23/20 1200 03/24/20 0457 03/25/20 0522 03/26/20 0441 03/27/20 0528 03/28/20 0533  NA   < >  --  141 138 138 139 141  K   < >  --  5.0 5.1 6.0* 5.2* 5.0  CL   < >  --  94* 92* 95* 97* 95*  CO2   < >  --  '28 30 29 31 28  '$ GLUCOSE   < >  --  121* 93 82 72 90  BUN   < >  --  61* 47* 62* 36* 50*  CREATININE   < >  --  10.11* 8.15* 9.97* 7.30* 9.31*  CALCIUM   < >  --  9.0 9.0 9.0 9.3 9.8  MG  --   --  2.2 2.2 2.2 2.1 2.2  PHOS  --  10.0* 7.1*  --   --  5.8* 7.3*   < > = values in this interval not displayed.   GFR: Estimated Creatinine Clearance: 8 mL/min (A) (by C-G formula based on SCr of 9.31 mg/dL (H)). Liver Function Tests: Recent Labs  Lab 03/22/20 1752  AST 12*  ALT 13  ALKPHOS 62  BILITOT 1.0  PROT 6.8  ALBUMIN 3.1*   No results for input(s): LIPASE, AMYLASE in the last 168 hours. No results for input(s): AMMONIA in the last 168 hours. Coagulation Profile: No results for input(s): INR, PROTIME in the last 168 hours. Cardiac Enzymes: No results for input(s): CKTOTAL, CKMB, CKMBINDEX, TROPONINI in the last 168 hours. BNP (last 3 results) No results for input(s): PROBNP in the last 8760 hours. HbA1C: No results for input(s): HGBA1C in the last 72 hours. CBG: Recent Labs  Lab 03/24/20 0956  GLUCAP 95   Lipid Profile: No results for input(s): CHOL, HDL, LDLCALC, TRIG, CHOLHDL, LDLDIRECT in the last 72 hours. Thyroid Function Tests: No results for input(s): TSH, T4TOTAL,  FREET4, T3FREE, THYROIDAB in the last 72 hours. Anemia Panel: No results for input(s): VITAMINB12, FOLATE, FERRITIN, TIBC, IRON, RETICCTPCT in the last 72 hours. Sepsis Labs: No results for input(s): PROCALCITON, LATICACIDVEN in the last 168 hours.  Recent Results (from the past 240 hour(s))  SARS CORONAVIRUS 2 (TAT 6-24 HRS) Nasopharyngeal Nasopharyngeal Swab     Status: None   Collection Time: 03/23/20  8:03 PM  Specimen: Nasopharyngeal Swab  Result Value Ref Range Status   SARS Coronavirus 2 NEGATIVE NEGATIVE Final    Comment: (NOTE) SARS-CoV-2 target nucleic acids are NOT DETECTED. The SARS-CoV-2 RNA is generally detectable in upper and lower respiratory specimens during the acute phase of infection. Negative results do not preclude SARS-CoV-2 infection, do not rule out co-infections with other pathogens, and should not be used as the sole basis for treatment or other patient management decisions. Negative results must be combined with clinical observations, patient history, and epidemiological information. The expected result is Negative. Fact Sheet for Patients: SugarRoll.be Fact Sheet for Healthcare Providers: https://www.woods-mathews.com/ This test is not yet approved or cleared by the [place] FDA and  has been authorized for detection and/or diagnosis of SARS-CoV-2 by FDA under an Emergency Use Authorization (EUA). This EUA will remain  in effect (meaning this test can be used) for the duration of the COVID-19 declaration under Section 56 4(b)(1) of the Act, 21 U.S.C. section 360bbb-3(b)(1), unless the authorization is terminated or revoked sooner. Performed at [facility], [place] [address], [place], [place] [address]       Radiology Studies: No results found.   Scheduled Meds: . amLODipine  10 mg Oral QHS  . apixaban  5 mg Oral BID  . gabapentin  300 mg Oral QHS  . hydrALAZINE  50 mg Oral Q8H  . lidocaine   2 patch Transdermal BID  . ramipril  10 mg Oral Daily  . sevelamer carbonate  2,400 mg Oral TID WC  . sodium zirconium cyclosilicate  10 g Oral Daily   Continuous Infusions:   LOS: 5 days     Enzo Bi, MD [facility] If 7PM-7AM, please contact night-coverage 03/28/2020, 5:25 PM

## 2020-03-28 NOTE — Progress Notes (Signed)
[facility]  ROUNDING NOTE   Subjective:   Seen and examined on hemodialysis treatment.     HEMODIALYSIS FLOWSHEET:  Blood Flow Rate (mL/min): 400 mL/min Arterial Pressure (mmHg): -180 mmHg Venous Pressure (mmHg): 140 mmHg Transmembrane Pressure (mmHg): 30 mmHg Ultrafiltration Rate (mL/min): 830 mL/min Dialysate Flow Rate (mL/min): 600 ml/min Conductivity: Machine : 14 Conductivity: Machine : 14 Dialysis Fluid Bolus: Normal Saline Bolus Amount (mL): 250 mL    Objective:  Vital signs in last 24 hours:  Temp:  [97.6 F (36.4 C)-98.5 F (36.9 C)] 97.6 F (36.4 C) (04/06 1330) Pulse Rate:  [84-91] 85 (04/06 1330) Resp:  [11-20] 13 (04/06 1330) BP: (98-167)/(54-97) 147/69 (04/06 1330) SpO2:  [95 %-100 %] 100 % (04/06 1330)  Weight change:  Filed Weights   03/22/20 1609 03/23/20 0727 03/24/20 0920  Weight: 91 kg 91 kg 91 kg    Intake/Output: No intake/output data recorded.   Intake/Output this shift:  Total I/O In: -  Out: 1984 [Other:1984]  Physical Exam: General: No acute distress, laying in bed  Head: Normocephalic, atraumatic. Moist oral mucosal membranes  Eyes: Anicteric  Neck: Supple, trachea midline  Lungs:  Clear to auscultation, normal effort  Heart: regular  Abdomen:  Soft, nontender, bowel sounds present  Extremities: No peripheral edema.  Neurologic: Awake, alert, following commands  Skin: No lesions  Access: LUE AVF    Basic Metabolic Panel: Recent Labs  Lab 03/22/20 1752 03/23/20 1200 03/24/20 0457 03/24/20 0457 03/25/20 0522 03/25/20 0522 03/26/20 0441 03/27/20 0528 03/28/20 0533  NA   < >  --  141  --  138  --  138 139 141  K   < >  --  5.0  --  5.1  --  6.0* 5.2* 5.0  CL   < >  --  94*  --  92*  --  95* 97* 95*  CO2   < >  --  28  --  30  --  29 31 28   GLUCOSE   < >  --  121*  --  93  --  82 72 90  BUN   < >  --  61*  --  47*  --  62* 36* 50*  CREATININE   < >  --  10.11*  --  8.15*  --  9.97* 7.30* 9.31*   CALCIUM   < >  --  9.0   < > 9.0   < > 9.0 9.3 9.8  MG  --   --  2.2  --  2.2  --  2.2 2.1 2.2  PHOS  --  10.0* 7.1*  --   --   --   --  5.8* 7.3*   < > = values in this interval not displayed.    Liver Function Tests: Recent Labs  Lab 03/22/20 1752  AST 12*  ALT 13  ALKPHOS 62  BILITOT 1.0  PROT 6.8  ALBUMIN 3.1*   No results for input(s): LIPASE, AMYLASE in the last 168 hours. No results for input(s): AMMONIA in the last 168 hours.  CBC: Recent Labs  Lab 03/22/20 1752 03/22/20 1752 03/24/20 0457 03/25/20 0522 03/26/20 0441 03/27/20 0528 03/28/20 0533  WBC 8.1   < > 7.4 6.0 5.6 4.5 5.7  NEUTROABS 6.3  --   --   --   --   --   --   HGB 11.0*   < > 11.2* 11.0* 10.4* 10.8* 10.6*  HCT 33.6*   < >  33.4* 33.4* 31.2* 33.6* 32.8*  MCV 95.7   < > 93.6 95.7 94.5 98.2 95.9  PLT 175   < > 195 182 181 171 179   < > = values in this interval not displayed.    Cardiac Enzymes: No results for input(s): CKTOTAL, CKMB, CKMBINDEX, TROPONINI in the last 168 hours.  BNP: Invalid input(s): POCBNP  CBG: Recent Labs  Lab 03/24/20 0956  GLUCAP 95    Microbiology: Results for orders placed or performed during the hospital encounter of 03/22/20  SARS CORONAVIRUS 2 (TAT 6-24 HRS) Nasopharyngeal Nasopharyngeal Swab     Status: None   Collection Time: 03/23/20  8:03 PM   Specimen: Nasopharyngeal Swab  Result Value Ref Range Status   SARS Coronavirus 2 NEGATIVE NEGATIVE Final    Comment: (NOTE) SARS-CoV-2 target nucleic acids are NOT DETECTED. The SARS-CoV-2 RNA is generally detectable in upper and lower respiratory specimens during the acute phase of infection. Negative results do not preclude SARS-CoV-2 infection, do not rule out co-infections with other pathogens, and should not be used as the sole basis for treatment or other patient management decisions. Negative results must be combined with clinical observations, patient history, and epidemiological information. The  expected result is Negative. Fact Sheet for Patients: SugarRoll.be Fact Sheet for Healthcare Providers: https://www.woods-mathews.com/ This test is not yet approved or cleared by the [place] FDA and  has been authorized for detection and/or diagnosis of SARS-CoV-2 by FDA under an Emergency Use Authorization (EUA). This EUA will remain  in effect (meaning this test can be used) for the duration of the COVID-19 declaration under Section 56 4(b)(1) of the Act, 21 U.S.C. section 360bbb-3(b)(1), unless the authorization is terminated or revoked sooner. Performed at [facility], [place] [address] [place], [place] [address]     Coagulation Studies: No results for input(s): LABPROT, INR in the last 72 hours.  Urinalysis: No results for input(s): COLORURINE, LABSPEC, PHURINE, GLUCOSEU, HGBUR, BILIRUBINUR, KETONESUR, PROTEINUR, UROBILINOGEN, NITRITE, LEUKOCYTESUR in the last 72 hours.  Invalid input(s): APPERANCEUR    Imaging: No results found.   Medications:    . amLODipine  10 mg Oral QHS  . apixaban  5 mg Oral BID  . gabapentin  300 mg Oral QHS  . hydrALAZINE  50 mg Oral Q8H  . lidocaine  2 patch Transdermal BID  . ramipril  10 mg Oral Daily  . sevelamer carbonate  2,400 mg Oral TID WC  . sodium zirconium cyclosilicate  10 g Oral Daily   HYDROcodone-acetaminophen, labetalol, ondansetron **OR** ondansetron (ZOFRAN) IV, senna-docusate  Assessment/ Plan:  [name] is a 74 y.o. black male with end-stage renal disease on hemodialysis, hypertension, hyperlipidemia, degenerative disc disease, admitted to [facility] on 03/22/2020 for Back pain [M54.9] Intractable back pain [M54.9] Acute midline low back pain without sciatica [M54.5]  [facility] Nephrology [address] MWF  1.  ESRD on HD MWF: with hyperkalemia.  Seen and examined on hemodialysis treatment.   2. Hypertension: 147/69 - ramipril, amlodipine and hydralazine.    3. Secondary Hyperparathyroidism:  - sevelamer with meals.   4. Anemia with chronic kidney disease: hemoglobin 10.6 No indication for EPO at this time.    LOS: 5 [name] 4/6/20212:02 PM

## 2020-03-28 NOTE — Progress Notes (Signed)
PT Cancellation Note  Patient Details Name: Brian Fleming MRN: [id number] DOB: 1946-01-25   Cancelled Treatment:     PT attempt. Pt was asleep upon therapist arriving. He at first agrees to PT session however once therapist removed sheets/blankets, pt becomes agitated and very resistive to session. Pt had HD this AM and quickly falls back to sleep after refusing PT. Therapist will return in morning to see if pt is more willing to participate.     Willette Pa 03/28/2020, 4:08 PM

## 2020-03-28 NOTE — TOC Progression Note (Signed)
Transition of Care [facility]) - Progression Note    Patient Details  Name: Brian Fleming MRN: [id number] Date of Birth: 03-06-1946  [facility] CM/SW Contact  Beverly Sessions, RN Phone Number: 03/28/2020, 4:20 PM  Clinical Narrative:    RNCM spoke with DSS.   To this date patient has not ambulated with PT.  Kylie with DSS states that in order for patient to be able to go to [facility] care level of care he would need to be able to ambulate  Once we know patient's mobility status Kylie will need an updated Fl2  RNCM attempted to call Kylie back to notify her that patient was unable to work with PT this date.  Her voicemail box is full   Expected Discharge Plan: Group Home    Expected Discharge Plan and Services Expected Discharge Plan: Group Home   Discharge Planning Services: CM Consult                                           Social Determinants of Health (SDOH) Interventions    Readmission Risk Interventions Readmission Risk Prevention Plan 03/24/2020 07/12/2019  Transportation Screening Complete Complete  PCP or Specialist Appt within 5-7 Days - Complete  Home Care Screening - Complete  Medication Review (RN CM) - Complete  Palliative Care Screening Not Applicable -  Medication Review (RN Care Manager) Complete -  Some recent data might be hidden

## 2020-03-28 NOTE — Progress Notes (Signed)
ID Biopsy/aspiration could not be done because pt was not able to cooperate for the procedure due to pain Neurosurgery has given back brace The only option is to empirically treat as chronic discitis/osteo with vanco and ceftazidime which can be given during dialysis and follow the ESR and CRP. Discussed with care team

## 2020-03-28 NOTE — Progress Notes (Signed)
It appears patient already has the brace on however was not seen earlier in the shift by nursing staff. It appears the brace goes around patients body more so around his stomach and place very loose. When nursing asked the patient if he would let me apply properly patient would not let me and stated  The brace causes his stomach to hurt. Will advise the provider and will pass on to the day nurse to try when patient is more awake.

## 2020-03-28 NOTE — Progress Notes (Signed)
Attempted I.V. start with ultrasound after patient refused any attempt in hand. Pt unable or unwilling to lay on his back for optimal positioning for ultrasound start.,attempt unsuccessful.Patient refuses another attempt; due to lack of cooperation this RN feels any further attempts would be unsuccessful.Patients RN @ bedside ,Will notify provider.

## 2020-03-29 LAB — CBC
HCT: 32.7 % — ABNORMAL LOW (ref 39.0–52.0)
Hemoglobin: 10.3 g/dL — ABNORMAL LOW (ref 13.0–17.0)
MCH: 31 pg (ref 26.0–34.0)
MCHC: 31.5 g/dL (ref 30.0–36.0)
MCV: 98.5 fL (ref 80.0–100.0)
Platelets: 165 10*3/uL (ref 150–400)
RBC: 3.32 MIL/uL — ABNORMAL LOW (ref 4.22–5.81)
RDW: 15.3 % (ref 11.5–15.5)
WBC: 5.5 10*3/uL (ref 4.0–10.5)
nRBC: 0 % (ref 0.0–0.2)

## 2020-03-29 LAB — BASIC METABOLIC PANEL
Anion gap: 12 (ref 5–15)
BUN: 29 mg/dL — ABNORMAL HIGH (ref 8–23)
CO2: 29 mmol/L (ref 22–32)
Calcium: 9.7 mg/dL (ref 8.9–10.3)
Chloride: 100 mmol/L (ref 98–111)
Creatinine, Ser: 6.97 mg/dL — ABNORMAL HIGH (ref 0.61–1.24)
GFR calc Af Amer: 8 mL/min — ABNORMAL LOW (ref >60)
GFR calc non Af Amer: 7 mL/min — ABNORMAL LOW (ref >60)
Glucose, Bld: 98 mg/dL (ref 70–99)
Potassium: 4.2 mmol/L (ref 3.5–5.1)
Sodium: 141 mmol/L (ref 135–145)

## 2020-03-29 LAB — MAGNESIUM: Magnesium: 2 mg/dL (ref 1.7–2.4)

## 2020-03-29 LAB — C-REACTIVE PROTEIN: CRP: 16.4 mg/dL — ABNORMAL HIGH (ref <1.0)

## 2020-03-29 LAB — SEDIMENTATION RATE: Sed Rate: 73 mm/hr — ABNORMAL HIGH (ref 0–20)

## 2020-03-29 LAB — PHOSPHORUS: Phosphorus: 5.7 mg/dL — ABNORMAL HIGH (ref 2.5–4.6)

## 2020-03-29 MED ORDER — NEPRO/CARBSTEADY PO LIQD
237.0000 mL | Freq: Two times a day (BID) | ORAL | Status: DC
  Administered 2020-03-30 – 2020-04-04 (×8): 237 mL via ORAL

## 2020-03-29 MED ORDER — SODIUM CHLORIDE 0.9 % IV SOLN
2.0000 g | INTRAVENOUS | Status: DC
  Administered 2020-03-31 – 2020-04-03 (×2): 2 g via INTRAVENOUS
  Filled 2020-03-29 (×3): qty 2

## 2020-03-29 MED ORDER — SODIUM CHLORIDE 0.9 % IV SOLN
1.0000 g | INTRAVENOUS | Status: DC
  Administered 2020-03-29: 1 g via INTRAVENOUS
  Filled 2020-03-29: qty 1

## 2020-03-29 MED ORDER — RENA-VITE PO TABS
1.0000 | ORAL_TABLET | Freq: Every day | ORAL | Status: DC
  Administered 2020-03-29 – 2020-04-03 (×6): 1 via ORAL
  Filled 2020-03-29 (×6): qty 1

## 2020-03-29 MED ORDER — HYDROCODONE-ACETAMINOPHEN 5-325 MG PO TABS
1.0000 | ORAL_TABLET | Freq: Four times a day (QID) | ORAL | Status: DC | PRN
  Administered 2020-03-29 – 2020-04-04 (×8): 1 via ORAL
  Filled 2020-03-29 (×9): qty 1

## 2020-03-29 MED ORDER — VANCOMYCIN HCL IN DEXTROSE 1-5 GM/200ML-% IV SOLN
1000.0000 mg | INTRAVENOUS | Status: DC
  Administered 2020-03-29: 1000 mg via INTRAVENOUS
  Filled 2020-03-29: qty 200

## 2020-03-29 NOTE — Progress Notes (Signed)
[facility]  ROUNDING NOTE   Subjective:   Seen and examined on hemodialysis treatment.     HEMODIALYSIS FLOWSHEET:  Blood Flow Rate (mL/min): 400 mL/min Arterial Pressure (mmHg): -180 mmHg Venous Pressure (mmHg): 160 mmHg Transmembrane Pressure (mmHg): 40 mmHg Ultrafiltration Rate (mL/min): 0 mL/min Dialysate Flow Rate (mL/min): 600 ml/min Conductivity: Machine : 13.9 Conductivity: Machine : 13.9 Dialysis Fluid Bolus: Normal Saline Bolus Amount (mL): 250 mL    Objective:  Vital signs in last 24 hours:  Temp:  [97.8 F (36.6 C)-98.9 F (37.2 C)] 98.8 F (37.1 C) (04/07 1422) Pulse Rate:  [35-96] 60 (04/07 1422) Resp:  [13-23] 20 (04/07 1422) BP: (75-133)/(40-85) 106/52 (04/07 1422) SpO2:  [98 %-100 %] 98 % (04/07 1422)  Weight change:  Filed Weights   03/22/20 1609 03/23/20 0727 03/24/20 0920  Weight: 91 kg 91 kg 91 kg    Intake/Output: I/O last 3 completed shifts: In: 0  Out: 1984 [Other:1984]   Intake/Output this shift:  Total I/O In: -  Out: 165 [Other:165]  Physical Exam: General: No acute distress, laying in bed  Head: Normocephalic, atraumatic. Moist oral mucosal membranes  Eyes: Anicteric  Neck: Supple, trachea midline  Lungs:  Clear to auscultation, normal effort  Heart: regular  Abdomen:  Soft, nontender, bowel sounds present  Extremities: No peripheral edema.  Neurologic: Awake, alert, following commands  Skin: No lesions  Access: LUE AVF    Basic Metabolic Panel: Recent Labs  Lab 03/22/20 1752 03/23/20 1200 03/24/20 0457 03/24/20 0457 03/25/20 0522 03/25/20 0522 03/26/20 0441 03/26/20 0441 03/27/20 0528 03/28/20 0533 03/29/20 0614  NA   < >  --  141   < > 138  --  138  --  139 141 141  K   < >  --  5.0   < > 5.1  --  6.0*  --  5.2* 5.0 4.2  CL   < >  --  94*   < > 92*  --  95*  --  97* 95* 100  CO2   < >  --  28   < > 30  --  29  --  31 28 29   GLUCOSE   < >  --  121*   < > 93  --  82  --  72 90 98  BUN   < >   --  61*   < > 47*  --  62*  --  36* 50* 29*  CREATININE   < >  --  10.11*   < > 8.15*  --  9.97*  --  7.30* 9.31* 6.97*  CALCIUM   < >  --  9.0   < > 9.0   < > 9.0   < > 9.3 9.8 9.7  MG  --   --  2.2   < > 2.2  --  2.2  --  2.1 2.2 2.0  PHOS  --  10.0* 7.1*  --   --   --   --   --  5.8* 7.3* 5.7*   < > = values in this interval not displayed.    Liver Function Tests: Recent Labs  Lab 03/22/20 1752  AST 12*  ALT 13  ALKPHOS 62  BILITOT 1.0  PROT 6.8  ALBUMIN 3.1*   No results for input(s): LIPASE, AMYLASE in the last 168 hours. No results for input(s): AMMONIA in the last 168 hours.  CBC: Recent Labs  Lab 03/22/20 1752 03/24/20  5188 03/25/20 0522 03/26/20 0441 03/27/20 0528 03/28/20 0533 03/29/20 0614  WBC 8.1   < > 6.0 5.6 4.5 5.7 5.5  NEUTROABS 6.3  --   --   --   --   --   --   HGB 11.0*   < > 11.0* 10.4* 10.8* 10.6* 10.3*  HCT 33.6*   < > 33.4* 31.2* 33.6* 32.8* 32.7*  MCV 95.7   < > 95.7 94.5 98.2 95.9 98.5  PLT 175   < > 182 181 171 179 165   < > = values in this interval not displayed.    Cardiac Enzymes: No results for input(s): CKTOTAL, CKMB, CKMBINDEX, TROPONINI in the last 168 hours.  BNP: Invalid input(s): POCBNP  CBG: Recent Labs  Lab 03/24/20 0956  GLUCAP 95    Microbiology: Results for orders placed or performed during the hospital encounter of 03/22/20  SARS CORONAVIRUS 2 (TAT 6-24 HRS) Nasopharyngeal Nasopharyngeal Swab     Status: None   Collection Time: 03/23/20  8:03 PM   Specimen: Nasopharyngeal Swab  Result Value Ref Range Status   SARS Coronavirus 2 NEGATIVE NEGATIVE Final    Comment: (NOTE) SARS-CoV-2 target nucleic acids are NOT DETECTED. The SARS-CoV-2 RNA is generally detectable in upper and lower respiratory specimens during the acute phase of infection. Negative results do not preclude SARS-CoV-2 infection, do not rule out co-infections with other pathogens, and should not be used as the sole basis for treatment or other  patient management decisions. Negative results must be combined with clinical observations, patient history, and epidemiological information. The expected result is Negative. Fact Sheet for Patients: SugarRoll.be Fact Sheet for Healthcare Providers: https://www.woods-mathews.com/ This test is not yet approved or cleared by the [place] FDA and  has been authorized for detection and/or diagnosis of SARS-CoV-2 by FDA under an Emergency Use Authorization (EUA). This EUA will remain  in effect (meaning this test can be used) for the duration of the COVID-19 declaration under Section 56 4(b)(1) of the Act, 21 U.S.C. section 360bbb-3(b)(1), unless the authorization is terminated or revoked sooner. Performed at [facility], [place] [address] [place], [place] [address]     Coagulation Studies: No results for input(s): LABPROT, INR in the last 72 hours.  Urinalysis: No results for input(s): COLORURINE, LABSPEC, PHURINE, GLUCOSEU, HGBUR, BILIRUBINUR, KETONESUR, PROTEINUR, UROBILINOGEN, NITRITE, LEUKOCYTESUR in the last 72 hours.  Invalid input(s): APPERANCEUR    Imaging: No results found.   Medications:   . cefTAZidime (FORTAZ)  IV    . vancomycin Stopped (03/29/20 1042)  . vancomycin     . amLODipine  10 mg Oral QHS  . apixaban  5 mg Oral BID  . gabapentin  300 mg Oral QHS  . hydrALAZINE  50 mg Oral Q8H  . lidocaine  2 patch Transdermal BID  . ramipril  10 mg Oral Daily  . sevelamer carbonate  2,400 mg Oral TID WC  . sodium zirconium cyclosilicate  10 g Oral Daily   HYDROcodone-acetaminophen, labetalol, ondansetron **OR** ondansetron (ZOFRAN) IV, senna-docusate  Assessment/ Plan:  [name] is a 74 y.o. black male with end-stage renal disease on hemodialysis, hypertension, hyperlipidemia, degenerative disc disease, admitted to [facility] on 03/22/2020 for Back pain [M54.9] Intractable back pain [M54.9] Acute midline low  back pain without sciatica [M54.5]  [facility] Nephrology [address] MWF  1.  ESRD on HD MWF: with hyperkalemia.  Seen and examined on hemodialysis treatment.   2. Hypertension:   - ramipril,  amlodipine and hydralazine.   3. Secondary Hyperparathyroidism:  - sevelamer with meals.   4. Anemia with chronic kidney disease:   No indication for EPO at this time.    LOS: 6 Brian Fleming 4/7/20212:55 PM

## 2020-03-29 NOTE — Progress Notes (Signed)
PROGRESS NOTE    Brian Fleming  NFA:[id number] DOB: 1946/09/14 DOA: 03/22/2020 PCP: [facility]    Assessment & Plan:   Principal Problem:   Intractable back pain Active Problems:   Essential hypertension   End stage renal disease on dialysis (HCC)   Hyperkalemia   Atrial flutter (HCC)   Back pain   Spinal stenosis   Discitis of lumbar region   Anemia    Brian Fleming is a 74 y.o. AA male with medical history significant for hypertension, A. fib on Eliquis, ESRD on HD MWF, with a history of chronic back pain, hospitalized in February 2021 for intractable back pain who returns to the emergency room with a complaint of back pain that has been worse over the past 3 days.  Denied weakness in the extremities or in the groin area, bladder or bowel problems.   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 --No surgical intervention at this time  --outpatient neurosurgery f/u --ID discussed with neurosurgery, and decided on treatment plan: "treat with vanco 1 gram during dialysis  and ceftazidime 2grams during dialysis - Recommend checking  ESR and CRP in  2 weeks and if there is improvement will continue to complete 6 weeks- If no improvement will DC antibiotics."    Essential hypertension -Continue home meds of amlodipine, hydralazine and ramipril    End stage renal disease on dialysis MWF(HCC) Secondary Hyperparathyroidism   Hyperkalemia --pt missed dialysis 2/2 back pain -Patient received a dose of Veltassa in the ER.   -Nephrology consult for continuation of dialysis PLAN: --dialysis per nephrology -Continue sevelamer --continue Lokelma    Atrial flutter ([place]) -Currently rate controlled.   --continue apixaban   Anemia with chronic kidney disease No indication for EPO at this time.    DVT prophylaxis: IO:NGEXBMW Code Status: Full code  Family Communication:  Disposition Plan: Medically ready for discharge. Pt has a guardian.  Will discharge to SNF vs Group home pending PT eval (pt hasn't been cooperative).   Subjective and Interval History:  This morning, pt said he had no back pain for the first time.  No fever, N/V/D.    Refused IV placement, so will get abx with dialysis.   Objective: Vitals:   03/29/20 1030 03/29/20 1045 03/29/20 1057 03/29/20 1422  BP: (!) 88/40 (!) 120/55  (!) 106/52  Pulse: 72 80 82 60  Resp: '20 15 13 20  '$ Temp:  97.8 F (36.6 C)  98.8 F (37.1 C)  TempSrc:  Oral  Oral  SpO2:    98%  Weight:      Height:        Intake/Output Summary (Last 24 hours) at 03/29/2020 1745 Last data filed at 03/29/2020 1045 Gross per 24 hour  Intake --  Output 165 ml  Net -165 ml   Filed Weights   03/22/20 1609 03/23/20 0727 03/24/20 0920  Weight: 91 kg 91 kg 91 kg    Examination:   Constitutional: NAD, lethargic, but arousable, body curled up lying on left side, calm and cooperative HEENT: conjunctivae and lids normal, EOMI CV: RRR no M,R,G. Distal pulses +2.  No cyanosis.   RESP: CTA B/L, normal respiratory effort  GI: +BS, NTND Extremities: No effusions, edema, in BLE MSK: deformed joints in  left hand,  SKIN: warm, dry and intact Neuro: II - XII grossly intact.  Sensation intact   Data Reviewed: I have personally reviewed following labs and imaging studies  CBC: Recent Labs  Lab 03/22/20 1752 03/24/20 0457 03/25/20 0522 03/26/20 0441 03/27/20 0528 03/28/20 0533 03/29/20 0614  WBC 8.1   < > 6.0 5.6 4.5 5.7 5.5  NEUTROABS 6.3  --   --   --   --   --   --   HGB 11.0*   < > 11.0* 10.4* 10.8* 10.6* 10.3*  HCT 33.6*   < > 33.4* 31.2* 33.6* 32.8* 32.7*  MCV 95.7   < > 95.7 94.5 98.2 95.9 98.5  PLT 175   < > 182 181 171 179 165   < > = values in this interval not displayed.   Basic Metabolic Panel: Recent Labs  Lab 03/22/20 1752 03/23/20 1200 03/24/20 0457 03/24/20 0457 03/25/20 0522 03/26/20 0441 03/27/20 0528 03/28/20 0533 03/29/20 0614  NA   < >  --  141   < > 138 138 139 141 141  K   < >  --  5.0   < > 5.1 6.0* 5.2* 5.0 4.2  CL   < >  --  94*   < > 92* 95* 97* 95* 100  CO2   < >  --  28   < > '30 29 31 28 29  '$ GLUCOSE   < >  --  121*   < > 93 82 72 90 98  BUN   < >  --  61*   < > 47* 62* 36* 50* 29*  CREATININE   < >  --  10.11*   < > 8.15* 9.97* 7.30* 9.31* 6.97*  CALCIUM   < >  --  9.0   < > 9.0 9.0 9.3 9.8 9.7  MG  --   --  2.2   < > 2.2 2.2 2.1 2.2 2.0  PHOS  --  10.0* 7.1*  --   --   --  5.8* 7.3* 5.7*   < > = values in this interval not displayed.   GFR: Estimated Creatinine Clearance: 10.6 mL/min (A) (by C-G formula based on SCr of 6.97 mg/dL (H)). Liver Function Tests: Recent Labs  Lab 03/22/20 1752  AST 12*  ALT 13  ALKPHOS 62  BILITOT 1.0  PROT 6.8  ALBUMIN 3.1*   No results for input(s): LIPASE, AMYLASE in the last 168 hours. No results for input(s): AMMONIA in the last 168 hours. Coagulation Profile: No results for input(s): INR, PROTIME in the last 168 hours. Cardiac Enzymes: No results for input(s): CKTOTAL, CKMB, CKMBINDEX, TROPONINI in the last 168 hours. BNP (last 3 results) No results for input(s): PROBNP in the last 8760  hours. HbA1C: No results for input(s): HGBA1C in the last 72 hours. CBG: Recent Labs  Lab 03/24/20 0956  GLUCAP 95   Lipid Profile: No results for input(s): CHOL, HDL, LDLCALC,  TRIG, CHOLHDL, LDLDIRECT in the last 72 hours. Thyroid Function Tests: No results for input(s): TSH, T4TOTAL, FREET4, T3FREE, THYROIDAB in the last 72 hours. Anemia Panel: No results for input(s): VITAMINB12, FOLATE, FERRITIN, TIBC, IRON, RETICCTPCT in the last 72 hours. Sepsis Labs: No results for input(s): PROCALCITON, LATICACIDVEN in the last 168 hours.  Recent Results (from the past 240 hour(s))  SARS CORONAVIRUS 2 (TAT 6-24 HRS) Nasopharyngeal Nasopharyngeal Swab     Status: None   Collection Time: 03/23/20  8:03 PM   Specimen: Nasopharyngeal Swab  Result Value Ref Range Status   SARS Coronavirus 2 NEGATIVE NEGATIVE Final    Comment: (NOTE) SARS-CoV-2 target nucleic acids are NOT DETECTED. The SARS-CoV-2 RNA is generally detectable in upper and lower respiratory specimens during the acute phase of infection. Negative results do not preclude SARS-CoV-2 infection, do not rule out co-infections with other pathogens, and should not be used as the sole basis for treatment or other patient management decisions. Negative results must be combined with clinical observations, patient history, and epidemiological information. The expected result is Negative. Fact Sheet for Patients: SugarRoll.be Fact Sheet for Healthcare Providers: https://www.woods-mathews.com/ This test is not yet approved or cleared by the [place] FDA and  has been authorized for detection and/or diagnosis of SARS-CoV-2 by FDA under an Emergency Use Authorization (EUA). This EUA will remain  in effect (meaning this test can be used) for the duration of the COVID-19 declaration under Section 56 4(b)(1) of the Act, 21 U.S.C. section 360bbb-3(b)(1), unless the authorization is terminated  or revoked sooner. Performed at [facility], [place] [address], [place], [place] [address]       Radiology Studies: No results found.   Scheduled Meds: . amLODipine  10 mg Oral QHS  . apixaban  5 mg Oral BID  . feeding supplement (NEPRO CARB STEADY)  237 mL Oral BID BM  . gabapentin  300 mg Oral QHS  . hydrALAZINE  50 mg Oral Q8H  . lidocaine  2 patch Transdermal BID  . multivitamin  1 tablet Oral QHS  . ramipril  10 mg Oral Daily  . sevelamer carbonate  2,400 mg Oral TID WC  . sodium zirconium cyclosilicate  10 g Oral Daily   Continuous Infusions: . cefTAZidime (FORTAZ)  IV    . vancomycin Stopped (03/29/20 1042)  . vancomycin       LOS: 6 days     Enzo Bi, MD [facility] If 7PM-7AM, please contact night-coverage 03/29/2020, 5:45 PM

## 2020-03-29 NOTE — Progress Notes (Signed)
PT Cancellation Note  Patient Details Name: Brian Fleming MRN: [id number] DOB: 22-Aug-1946   Cancelled Treatment:     Pt currently having HD. Therapist will return later this date.    Willette Pa 03/29/2020, 8:26 AM

## 2020-03-29 NOTE — TOC Progression Note (Signed)
Transition of Care [facility]) - Progression Note    Patient Details  Name: Brian Fleming MRN: [id number] Date of Birth: 22-Nov-1946  Transition of Care [facility]) CM/SW [place], LCSW Phone Number: 03/29/2020, 3:10 PM  Clinical Narrative: Left voicemail for patient's DSS guardian. Will provide PT's update when she calls back.  Expected Discharge Plan: Group Home    Expected Discharge Plan and Services Expected Discharge Plan: Group Home   Discharge Planning Services: CM Consult                                           Social Determinants of Health (SDOH) Interventions    Readmission Risk Interventions Readmission Risk Prevention Plan 03/24/2020 07/12/2019  Transportation Screening Complete Complete  PCP or Specialist Appt within 5-7 Days - Complete  Home Care Screening - Complete  Medication Review (RN CM) - Complete  Palliative Care Screening Not Applicable -  Medication Review (RN Care Manager) Complete -  Some recent data might be hidden

## 2020-03-29 NOTE — Progress Notes (Signed)
Attempted patient assessment and medication administration. He was very irritated and confused. Again refused assessment and medication.

## 2020-03-29 NOTE — Progress Notes (Signed)
Patient refused assessment and blood pressure.

## 2020-03-29 NOTE — Progress Notes (Signed)
ID  Pt with back pain leading to multiple hospital visits in the past few weeks I reviewed  MRI with musculoskeletal radiologist Dr.Patel today and there is not much change between July 2020 and April 2021 and infection was considered to be less likely     ESR/CRP elevated and patient could not get a biopsy. He is a HD patient and hence at some risk for infection. So weighing the pros and cons of RX decided to treat with vanco 1 gram during dialysis  and ceftazidime 2grams during dialysis - Recommend checking  ESR and CRP in 2 weeks and if there is improvement will continue to complete 6 weeks- If no improvement will DC antibiotics.

## 2020-03-29 NOTE — Progress Notes (Signed)
PT Cancellation Note  Patient Details Name: Brian Fleming MRN: [id number] DOB: 06-20-1946   Cancelled Treatment:     PT attempt. RN leaving room and reports pt is refusing meds and all care. Max encouragement to participate with PT but pt unwilling. Pt has refused PT since evaluation on 4/3.  Becomes angry and reports not needing to participate. PT will continue efforts to see pt per POC.    Willette Pa 03/29/2020, 3:02 PM

## 2020-03-30 MED ORDER — VANCOMYCIN HCL IN DEXTROSE 1-5 GM/200ML-% IV SOLN
1000.0000 mg | INTRAVENOUS | Status: DC
  Administered 2020-03-31 – 2020-04-03 (×2): 1000 mg via INTRAVENOUS
  Filled 2020-03-30 (×4): qty 200

## 2020-03-30 MED ORDER — VANCOMYCIN HCL 1500 MG/300ML IV SOLN
1500.0000 mg | INTRAVENOUS | Status: AC
  Administered 2020-03-31: 1500 mg via INTRAVENOUS
  Filled 2020-03-30: qty 300

## 2020-03-30 MED ORDER — CHLORHEXIDINE GLUCONATE CLOTH 2 % EX PADS
6.0000 | MEDICATED_PAD | Freq: Every day | CUTANEOUS | Status: DC
  Administered 2020-03-31 – 2020-04-04 (×5): 6 via TOPICAL

## 2020-03-30 NOTE — Progress Notes (Signed)
PROGRESS NOTE    Brian Fleming  KZL:[id number] DOB: 1945/12/28 DOA: 03/22/2020 PCP: [facility]      Brief Narrative:  Brian Fleming is a 74 y.o. M with hypertension, A. fib on Eliquis, ESRD on HD MWF, with a history of chronic back pain, hospitalized in February 2021 for intractable back pain who returns to the emergency room with a complaint of back pain that has been worse over the past 3 days. Denied weakness in the extremities or in the groin area, bladder or bowel problems.       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 ID consulted, recommending IV antibiotic therapy: -Continue Lidocaine patch -Continue TLSO brace to be worn at all times when out of bed, per Neurosurgery however, pt refused to wear it.  -outpatient neurosurgery f/u  -Continue vancomycin 1g q dialysis and ceftazidime 2g q dialysis  -Trend ESR and CRP Plan to re-evaluate in 2 weeks and determine need for ongoing abx at that time   Essential hypertension -Continue amlodipine, hydralazine and ramipril  End stage renal disease on dialysis MWF(HCC) Secondary Hyperparathyroidism Hyperkalemia -Consult Nephr for maintenance dialysis, appreciate cares  Atrial flutter ([place]) -Continue apixaban   Anemia with chronic kidney disease No indication for EPO at this time.   Marland Kitchen amLODipine  10 mg Oral QHS  . apixaban  5 mg Oral BID  . feeding supplement (NEPRO CARB STEADY)  237 mL Oral BID BM  . gabapentin  300 mg Oral QHS  . hydrALAZINE  50 mg Oral Q8H  . lidocaine  2 patch Transdermal BID  .  multivitamin  1 tablet Oral QHS  . ramipril  10 mg Oral Daily  . sevelamer carbonate  2,400 mg Oral TID WC  . sodium zirconium cyclosilicate  10 g Oral Daily   Recent Labs  Lab 03/25/20 0522 03/25/20 0522 03/26/20 0441 03/26/20 0441 03/27/20 0528 03/28/20 0533 03/29/20 0614  NA 138   < > 138   < > 139 141 141  K 5.1   < > 6.0*   < > 5.2* 5.0 4.2  CO2 30   < > 29   < > '31 28 29  '$ BUN 47*   < > 62*   < > 36* 50* 29*  CREATININE 8.15*   < > 9.97*   < > 7.30* 9.31* 6.97*  MG 2.2   < > 2.2   < > 2.1 2.2 2.0  WBC 6.0  --  5.6  --  4.5 5.7 5.5  HGB 11.0*  --  10.4*  --  10.8* 10.6* 10.3*  PLT 182  --  181  --  171 179 165   < > = values in this interval not displayed.    Current Meds  Medication Sig  . aspirin EC 81 MG tablet Take 81 mg by mouth daily.  . B Complex-C-Folic Acid (RENA-VITE PO) Take 1 tablet by mouth daily.  Marland Kitchen docusate sodium (COLACE) 100 MG capsule Take 1 capsule (100 mg total) by mouth daily as needed.  . gabapentin (NEURONTIN) 300 MG capsule Take 300 mg by mouth daily.   Marland Kitchen HYDROcodone-acetaminophen (NORCO/VICODIN) 5-325 MG tablet Take 1 tablet by mouth  every 4 (four) hours as needed for moderate pain.  . rosuvastatin (CRESTOR) 10 MG tablet Take 10 mg by mouth at bedtime.  . sevelamer carbonate (RENVELA) 800 MG tablet Take 2,400 mg by mouth 3 (three) times daily.  . traMADol (ULTRAM) 50 MG tablet Take 1 tablet (50 mg total) by mouth every 12 (twelve) hours as needed for severe pain (for pain no relieved by tylenol).        Disposition: The patient was admitted with back pain from osteomyelitis.   I will discharge when safe disposition can be found.        MDM: The below labs and imaging reports were reviewed and summarized above.  Medication management as above.   DVT prophylaxis: N/A on Eliquis Code Status: FULL Family Communication:     Consultants:     Procedures:     Antimicrobials:      Culture data:               Subjective: The patient has no complaints.  He has no focal weakness, numbness.  Back pain is well controlled.  He is sleepy.  No fever.  Objective: Vitals:   03/29/20 2053 03/30/20 0415 03/30/20 1059 03/30/20 1650  BP: (!) 154/82 (!) 141/63 (!) 145/58 (!) 123/55  Pulse: 88 86 (!) 52 (!) 52  Resp: '20 20  18  '$ Temp: 99.1 F (37.3 C) 98.3 F (36.8 C)  98.6 F (37 C)  TempSrc: Oral Oral  Oral  SpO2: 98% 99% 100% 96%  Weight:      Height:        Intake/Output Summary (Last 24 hours) at 03/30/2020 1654 Last data filed at 03/29/2020 2300 Gross per 24 hour  Intake 0 ml  Output 0 ml  Net 0 ml   Filed Weights   03/22/20 1609 03/23/20 0727 03/24/20 0920  Weight: 91 kg 91 kg 91 kg    Examination: General appearance:  adult male, alert and in no acute distress.   HEENT: Anicteric, conjunctiva pink, lids and lashes normal. No nasal deformity, discharge, epistaxis.  Lips moist, edentul, oropharynx moist, no oral lesions.   Skin: Warm and dry.  No jaundice.n  No suspicious rashes or lesions. Cardiac: RRR, nl S1-S2, no murmurs appreciated.  Capillary refill is brisk.  JVP normal.  no LE edema.  Radial  pulses 2+ and symmetric. Respiratory: Normal respiratory rate and rhythm.  CTAB without rales or wheezes. Abdomen: Abdomen soft.  No TTP or guarding. No ascites, distension, hepatosplenomegaly.   MSK: No deformities or effusions. Neuro: Awake and alert.  EOMI, moves all extremities with generalized weakness. Speech fluent.    Psych: Sensorium intact and responding to questions, attention diminished, somnolent. Affect blunted.  Judgment and insight appear severely impaired.    Data Reviewed: I have personally reviewed following labs and imaging studies:  CBC: Recent Labs  Lab 03/25/20 0522 03/26/20 0441 03/27/20 0528 03/28/20 0533 03/29/20 0614  WBC 6.0 5.6 4.5 5.7 5.5  HGB 11.0* 10.4* 10.8* 10.6* 10.3*  HCT 33.4* 31.2* 33.6* 32.8* 32.7*  MCV 95.7 94.5 98.2 95.9 98.5  PLT 182 181  171 179 161   Basic Metabolic Panel: Recent Labs  Lab 03/24/20 0457 03/24/20 0457 03/25/20 0522 03/26/20 0441 03/27/20 0528 03/28/20 0533 03/29/20 0614  NA 141   < > 138 138 139 141 141  K 5.0   < > 5.1 6.0* 5.2* 5.0 4.2  CL 94*   < > 92* 95* 97* 95* 100  CO2 28   < >  $'30 29 31 28 29  'F$ GLUCOSE 121*   < > 93 82 72 90 98  BUN 61*   < > 47* 62* 36* 50* 29*  CREATININE 10.11*   < > 8.15* 9.97* 7.30* 9.31* 6.97*  CALCIUM 9.0   < > 9.0 9.0 9.3 9.8 9.7  MG 2.2   < > 2.2 2.2 2.1 2.2 2.0  PHOS 7.1*  --   --   --  5.8* 7.3* 5.7*   < > = values in this interval not displayed.   GFR: Estimated Creatinine Clearance: 10.6 mL/min (A) (by C-G formula based on SCr of 6.97 mg/dL (H)). Liver Function Tests: No results for input(s): AST, ALT, ALKPHOS, BILITOT, PROT, ALBUMIN in the last 168 hours. No results for input(s): LIPASE, AMYLASE in the last 168 hours. No results for input(s): AMMONIA in the last 168 hours. Coagulation Profile: No results for input(s): INR, PROTIME in the last 168 hours. Cardiac Enzymes: No results for input(s): CKTOTAL, CKMB, CKMBINDEX, TROPONINI in the last 168 hours. BNP (last 3 results) No results for input(s): PROBNP in the last 8760 hours. HbA1C: No results for input(s): HGBA1C in the last 72 hours. CBG: Recent Labs  Lab 03/24/20 0956  GLUCAP 95   Lipid Profile: No results for input(s): CHOL, HDL, LDLCALC, TRIG, CHOLHDL, LDLDIRECT in the last 72 hours. Thyroid Function Tests: No results for input(s): TSH, T4TOTAL, FREET4, T3FREE, THYROIDAB in the last 72 hours. Anemia Panel: No results for input(s): VITAMINB12, FOLATE, FERRITIN, TIBC, IRON, RETICCTPCT in the last 72 hours. Urine analysis: No results found for: COLORURINE, APPEARANCEUR, LABSPEC, PHURINE, GLUCOSEU, HGBUR, BILIRUBINUR, KETONESUR, PROTEINUR, UROBILINOGEN, NITRITE, LEUKOCYTESUR Sepsis Labs: '@LABRCNTIP'$ (procalcitonin:4,lacticacidven:4)  ) Recent Results (from the past 240 hour(s))  SARS  CORONAVIRUS 2 (TAT 6-24 HRS) Nasopharyngeal Nasopharyngeal Swab     Status: None   Collection Time: 03/23/20  8:03 PM   Specimen: Nasopharyngeal Swab  Result Value Ref Range Status   SARS Coronavirus 2 NEGATIVE NEGATIVE Final    Comment: (NOTE) SARS-CoV-2 target nucleic acids are NOT DETECTED. The SARS-CoV-2 RNA is generally detectable in upper and lower respiratory specimens during the acute phase of infection. Negative results do not preclude SARS-CoV-2 infection, do not rule out co-infections with other pathogens, and should not be used as the sole basis for treatment or other patient management decisions. Negative results must be combined with clinical observations, patient history, and epidemiological information. The expected result is Negative. Fact Sheet for Patients: SugarRoll.be Fact Sheet for Healthcare Providers: https://www.woods-mathews.com/ This test is not yet approved or cleared by the [place] FDA and  has been authorized for detection and/or diagnosis of SARS-CoV-2 by FDA under an Emergency Use Authorization (EUA). This EUA will remain  in effect (meaning this test can be used) for the duration of the COVID-19 declaration under Section 56 4(b)(1) of the Act, 21 U.S.C. section 360bbb-3(b)(1), unless the authorization is terminated or revoked sooner. Performed at [facility], [place] [address] [place], [place] [address]          Radiology Studies: No results found.      Scheduled Meds: . amLODipine  10 mg Oral QHS  . apixaban  5 mg Oral BID  . feeding supplement (NEPRO CARB STEADY)  237 mL Oral BID BM  . gabapentin  300 mg Oral QHS  . hydrALAZINE  50 mg Oral Q8H  . lidocaine  2 patch Transdermal BID  . multivitamin  1 tablet Oral QHS  . ramipril  10 mg  Oral Daily  . sevelamer carbonate  2,400 mg Oral TID WC  . sodium zirconium cyclosilicate  10 g Oral Daily   Continuous Infusions: . cefTAZidime  (FORTAZ)  IV    . [START ON 04/03/2020] vancomycin    . [START ON 03/31/2020] vancomycin       LOS: 7 days    Time spent: 25 Minutes    Edwin Dada, MD [facility] 03/30/2020, 4:54 PM     Please page though [place] or Epic secure chat:  For [employer], Adult nurse

## 2020-03-30 NOTE — Progress Notes (Signed)
[facility]  ROUNDING NOTE   Subjective:   Patient with no complaints. Hemodialysis treatment yesterday. Tolerated treatment well.    Objective:  Vital signs in last 24 hours:  Temp:  [98.3 F (36.8 C)-99.1 F (37.3 C)] 98.3 F (36.8 C) (04/08 0415) Pulse Rate:  [52-88] 52 (04/08 1059) Resp:  [20] 20 (04/08 0415) BP: (141-154)/(58-82) 145/58 (04/08 1059) SpO2:  [98 %-100 %] 100 % (04/08 1059)  Weight change:  Filed Weights   03/22/20 1609 03/23/20 0727 03/24/20 0920  Weight: 91 kg 91 kg 91 kg    Intake/Output: I/O last 3 completed shifts: In: 0  Out: 165 [Other:165]   Intake/Output this shift:  No intake/output data recorded.  Physical Exam: General: No acute distress, laying in bed  Head: Normocephalic, atraumatic. Moist oral mucosal membranes  Eyes: Anicteric  Neck: Supple, trachea midline  Lungs:  Clear to auscultation, normal effort  Heart: regular  Abdomen:  Soft, nontender, bowel sounds present  Extremities: No peripheral edema.  Neurologic: Awake, alert, following commands  Skin: No lesions  Access: LUE AVF    Basic Metabolic Panel: Recent Labs  Lab 03/24/20 0457 03/24/20 0457 03/25/20 0522 03/25/20 0522 03/26/20 0441 03/26/20 0441 03/27/20 0528 03/28/20 0533 03/29/20 0614  NA 141   < > 138  --  138  --  139 141 141  K 5.0   < > 5.1  --  6.0*  --  5.2* 5.0 4.2  CL 94*   < > 92*  --  95*  --  97* 95* 100  CO2 28   < > 30  --  29  --  31 28 29   GLUCOSE 121*   < > 93  --  82  --  72 90 98  BUN 61*   < > 47*  --  62*  --  36* 50* 29*  CREATININE 10.11*   < > 8.15*  --  9.97*  --  7.30* 9.31* 6.97*  CALCIUM 9.0   < > 9.0   < > 9.0   < > 9.3 9.8 9.7  MG 2.2   < > 2.2  --  2.2  --  2.1 2.2 2.0  PHOS 7.1*  --   --   --   --   --  5.8* 7.3* 5.7*   < > = values in this interval not displayed.    Liver Function Tests: No results for input(s): AST, ALT, ALKPHOS, BILITOT, PROT, ALBUMIN in the last 168 hours. No results for input(s):  LIPASE, AMYLASE in the last 168 hours. No results for input(s): AMMONIA in the last 168 hours.  CBC: Recent Labs  Lab 03/25/20 0522 03/26/20 0441 03/27/20 0528 03/28/20 0533 03/29/20 0614  WBC 6.0 5.6 4.5 5.7 5.5  HGB 11.0* 10.4* 10.8* 10.6* 10.3*  HCT 33.4* 31.2* 33.6* 32.8* 32.7*  MCV 95.7 94.5 98.2 95.9 98.5  PLT 182 181 171 179 165    Cardiac Enzymes: No results for input(s): CKTOTAL, CKMB, CKMBINDEX, TROPONINI in the last 168 hours.  BNP: Invalid input(s): POCBNP  CBG: Recent Labs  Lab 03/24/20 0956  GLUCAP 95    Microbiology: Results for orders placed or performed during the hospital encounter of 03/22/20  SARS CORONAVIRUS 2 (TAT 6-24 HRS) Nasopharyngeal Nasopharyngeal Swab     Status: None   Collection Time: 03/23/20  8:03 PM   Specimen: Nasopharyngeal Swab  Result Value Ref Range Status   SARS Coronavirus 2 NEGATIVE NEGATIVE Final  Comment: (NOTE) SARS-CoV-2 target nucleic acids are NOT DETECTED. The SARS-CoV-2 RNA is generally detectable in upper and lower respiratory specimens during the acute phase of infection. Negative results do not preclude SARS-CoV-2 infection, do not rule out co-infections with other pathogens, and should not be used as the sole basis for treatment or other patient management decisions. Negative results must be combined with clinical observations, patient history, and epidemiological information. The expected result is Negative. Fact Sheet for Patients: SugarRoll.be Fact Sheet for Healthcare Providers: https://www.woods-mathews.com/ This test is not yet approved or cleared by the [place] FDA and  has been authorized for detection and/or diagnosis of SARS-CoV-2 by FDA under an Emergency Use Authorization (EUA). This EUA will remain  in effect (meaning this test can be used) for the duration of the COVID-19 declaration under Section 56 4(b)(1) of the Act, 21 U.S.C. section  360bbb-3(b)(1), unless the authorization is terminated or revoked sooner. Performed at [facility], [place] [address] [place], [place] [address]     Coagulation Studies: No results for input(s): LABPROT, INR in the last 72 hours.  Urinalysis: No results for input(s): COLORURINE, LABSPEC, PHURINE, GLUCOSEU, HGBUR, BILIRUBINUR, KETONESUR, PROTEINUR, UROBILINOGEN, NITRITE, LEUKOCYTESUR in the last 72 hours.  Invalid input(s): APPERANCEUR    Imaging: No results found.   Medications:   . cefTAZidime (FORTAZ)  IV    . [START ON 04/03/2020] vancomycin    . [START ON 03/31/2020] vancomycin     . amLODipine  10 mg Oral QHS  . apixaban  5 mg Oral BID  . feeding supplement (NEPRO CARB STEADY)  237 mL Oral BID BM  . gabapentin  300 mg Oral QHS  . hydrALAZINE  50 mg Oral Q8H  . lidocaine  2 patch Transdermal BID  . multivitamin  1 tablet Oral QHS  . ramipril  10 mg Oral Daily  . sevelamer carbonate  2,400 mg Oral TID WC  . sodium zirconium cyclosilicate  10 g Oral Daily   HYDROcodone-acetaminophen, labetalol, ondansetron **OR** ondansetron (ZOFRAN) IV, senna-docusate  Assessment/ Plan:  [name] is a 74 y.o. black male with end-stage renal disease on hemodialysis, hypertension, hyperlipidemia, degenerative disc disease, admitted to [facility] on 03/22/2020 for Back pain [M54.9] Intractable back pain [M54.9] Acute midline low back pain without sciatica [M54.5]  [facility] [address] MWF  1.  ESRD on HD MWF: with hyperkalemia.   2. Hypertension:   - ramipril, amlodipine and hydralazine.   3. Secondary Hyperparathyroidism:  - sevelamer with meals.   4. Anemia with chronic kidney disease:   No indication for EPO at this time.   5. Disciitis: unable to get biopsy - empiric antibiotics: ceftazidime 2g and vancomycin 1g for each hemodialysis treatment for six weeks. End date May 19.    LOS: 7 Brian Fleming 4/8/20214:17 PM

## 2020-03-30 NOTE — Consult Note (Signed)
Pharmacy Antibiotic Note  Brian Fleming is a 74 y.o. male with a history of chronic discitis-osteomyelitis, ESRD on HD with MWF schedule admitted on 03/22/2020 with osteomyelitis. Inflammatory markers elevated. Pharmacy has been consulted for vancomycin and ceftazidime dosing.  Patient received hemodialysis Tuesday 4/6. Biopsy unable to be completed as patient was unable to cooperate due to pain.   Noted that patient did not receive 2gm Loading dose on 4/6 as only has AVF (no IV access).  Did not recognized until after HD on 4/7 so additional vancomycin unable to be given since no IV access.   Plan:  Ceftazidime 2 qHD MWF  Since vancomycin loading dose not given as described above, will schedule 1500mg  IV x 1 tomorrow with HD, then change back to 1gm IV qHD MWF  Check levels prior to 3rd - 4th HD session after resuming 1gm qHD   Levels before dialysis  maintenance dose   Height: 5\' 8"  (172.7 cm) Weight: 91 kg (200 lb 9.9 oz) IBW/kg (Calculated) : 68.4  Temp (24hrs), Avg:98.7 F (37.1 C), Min:98.3 F (36.8 C), Max:99.1 F (37.3 C)  Recent Labs  Lab 03/25/20 0522 03/26/20 0441 03/27/20 0528 03/28/20 0533 03/29/20 0614  WBC 6.0 5.6 4.5 5.7 5.5  CREATININE 8.15* 9.97* 7.30* 9.31* 6.97*    Estimated Creatinine Clearance: 10.6 mL/min (A) (by C-G formula based on SCr of 6.97 mg/dL (H)).    No Known Allergies  Antimicrobials this admission: Vancomycin 4/6 >>  Ceftazidime 4/6 >>   Dose adjustments this admission: n/a  Microbiology results: 4/1 SARS-CoV-2: negative  Thank you for allowing pharmacy to be a part of this patient's care.  Doreene Eland, PharmD, BCPS.   Work Cell: [phone] 03/30/2020 3:00 PM

## 2020-03-30 NOTE — Care Management Important Message (Signed)
Important Message  Patient Details  Name: Brian Fleming MRN: [id number] Date of Birth: [birth year]   Medicare Important Message Given:  Yes  Copy of Medicare IM emailed securely to [employer], [place].     Dannette Barbara 03/30/2020, 2:15 PM

## 2020-03-30 NOTE — TOC Progression Note (Signed)
Transition of Care [facility]) - Progression Note    Patient Details  Name: Brian Fleming MRN: [id number] Date of Birth: July 15, 1946  Transition of Care [facility]) CM/SW [place], LCSW Phone Number: 03/30/2020, 4:14 PM  Clinical Narrative: Spoke to patient's DSS worker, Raymond Gurney. They still need an FL2 before they can start looking for placement and need to know patient's level of functioning.    Expected Discharge Plan: Group Home    Expected Discharge Plan and Services Expected Discharge Plan: Group Home   Discharge Planning Services: CM Consult                                           Social Determinants of Health (SDOH) Interventions    Readmission Risk Interventions Readmission Risk Prevention Plan 03/24/2020 07/12/2019  Transportation Screening Complete Complete  PCP or Specialist Appt within 5-7 Days - Complete  Home Care Screening - Complete  Medication Review (RN CM) - Complete  Palliative Care Screening Not Applicable -  Medication Review (RN Care Manager) Complete -  Some recent data might be hidden

## 2020-03-31 DIAGNOSIS — I10 Essential (primary) hypertension: Secondary | ICD-10-CM

## 2020-03-31 LAB — CBC
HCT: 31.4 % — ABNORMAL LOW (ref 39.0–52.0)
HCT: 29.4 % — ABNORMAL LOW (ref 39.0–52.0)
Hemoglobin: 9.8 g/dL — ABNORMAL LOW (ref 13.0–17.0)
Hemoglobin: 9.3 g/dL — ABNORMAL LOW (ref 13.0–17.0)
MCH: 31.1 pg (ref 26.0–34.0)
MCH: 30.9 pg (ref 26.0–34.0)
MCHC: 31.2 g/dL (ref 30.0–36.0)
MCHC: 31.6 g/dL (ref 30.0–36.0)
MCV: 99.7 fL (ref 80.0–100.0)
MCV: 97.7 fL (ref 80.0–100.0)
Platelets: 171 10*3/uL (ref 150–400)
Platelets: 157 10*3/uL (ref 150–400)
RBC: 3.15 MIL/uL — ABNORMAL LOW (ref 4.22–5.81)
RBC: 3.01 MIL/uL — ABNORMAL LOW (ref 4.22–5.81)
RDW: 15.4 % (ref 11.5–15.5)
RDW: 15.3 % (ref 11.5–15.5)
WBC: 4.9 10*3/uL (ref 4.0–10.5)
WBC: 4.3 10*3/uL (ref 4.0–10.5)
nRBC: 0 % (ref 0.0–0.2)
nRBC: 0 % (ref 0.0–0.2)

## 2020-03-31 LAB — RENAL FUNCTION PANEL
Albumin: 2.8 g/dL — ABNORMAL LOW (ref 3.5–5.0)
Albumin: 2.5 g/dL — ABNORMAL LOW (ref 3.5–5.0)
Anion gap: 13 (ref 5–15)
Anion gap: 12 (ref 5–15)
BUN: 45 mg/dL — ABNORMAL HIGH (ref 8–23)
BUN: 45 mg/dL — ABNORMAL HIGH (ref 8–23)
CO2: 30 mmol/L (ref 22–32)
CO2: 28 mmol/L (ref 22–32)
Calcium: 10.1 mg/dL (ref 8.9–10.3)
Calcium: 9.4 mg/dL (ref 8.9–10.3)
Chloride: 96 mmol/L — ABNORMAL LOW (ref 98–111)
Chloride: 97 mmol/L — ABNORMAL LOW (ref 98–111)
Creatinine, Ser: 7.89 mg/dL — ABNORMAL HIGH (ref 0.61–1.24)
Creatinine, Ser: 8.56 mg/dL — ABNORMAL HIGH (ref 0.61–1.24)
GFR calc Af Amer: 7 mL/min — ABNORMAL LOW (ref >60)
GFR calc Af Amer: 7 mL/min — ABNORMAL LOW (ref >60)
GFR calc non Af Amer: 6 mL/min — ABNORMAL LOW (ref >60)
GFR calc non Af Amer: 6 mL/min — ABNORMAL LOW (ref >60)
Glucose, Bld: 104 mg/dL — ABNORMAL HIGH (ref 70–99)
Glucose, Bld: 153 mg/dL — ABNORMAL HIGH (ref 70–99)
Phosphorus: 6.6 mg/dL — ABNORMAL HIGH (ref 2.5–4.6)
Phosphorus: 6.1 mg/dL — ABNORMAL HIGH (ref 2.5–4.6)
Potassium: 4.4 mmol/L (ref 3.5–5.1)
Potassium: 3.9 mmol/L (ref 3.5–5.1)
Sodium: 139 mmol/L (ref 135–145)
Sodium: 137 mmol/L (ref 135–145)

## 2020-03-31 MED ORDER — SODIUM CHLORIDE 0.9 % IV SOLN
INTRAVENOUS | Status: DC | PRN
  Administered 2020-03-31: 30 mL via INTRAVENOUS

## 2020-03-31 MED ORDER — EPOETIN ALFA 10000 UNIT/ML IJ SOLN
10000.0000 [IU] | INTRAMUSCULAR | Status: DC
  Administered 2020-03-31 – 2020-04-03 (×2): 10000 [IU] via INTRAVENOUS
  Filled 2020-03-31: qty 1

## 2020-03-31 NOTE — Consult Note (Signed)
Pharmacy Antibiotic Note  Brian Fleming is a 74 y.o. male with a history of chronic discitis-osteomyelitis, ESRD on HD with MWF schedule admitted on 03/22/2020 with osteomyelitis. Inflammatory markers elevated. Pharmacy has been consulted for vancomycin and ceftazidime dosing.  Patient received hemodialysis Tuesday 4/6. Biopsy unable to be completed as patient was unable to cooperate due to pain.   Noted that patient did not receive 2gm Loading dose on 4/6 as only has AVF (no IV access).  Did not recognized until after HD on 4/7 so additional vancomycin unable to be given since no IV access.   Plan:  Ceftazidime 2 qHD MWF  Since vancomycin loading dose not given as described above, will schedule 1500mg  IV x 1 today with HD, then change back to 1gm IV qHD MWF  Check levels prior to 3rd - 4th HD session after resuming 1gm qHD   Levels before dialysis  maintenance dose   Height: 5\' 8"  (172.7 cm) Weight: 91 kg (200 lb 9.9 oz) IBW/kg (Calculated) : 68.4  Temp (24hrs), Avg:98.1 F (36.7 C), Min:97.7 F (36.5 C), Max:98.6 F (37 C)  Recent Labs  Lab 03/26/20 0441 03/27/20 0528 03/28/20 0533 03/29/20 0614 03/31/20 0551  WBC 5.6 4.5 5.7 5.5 4.9  CREATININE 9.97* 7.30* 9.31* 6.97* 7.89*    Estimated Creatinine Clearance: 9.4 mL/min (A) (by C-G formula based on SCr of 7.89 mg/dL (H)).    No Known Allergies  Antimicrobials this admission: Vancomycin 4/6 >>  Ceftazidime 4/6 >>   Microbiology results: 4/1 SARS-CoV-2: negative  Thank you for allowing pharmacy to be a part of this patient's care.  Vallery Sa, PharmD, BCPS.   Work Cell: [phone] 03/31/2020 10:52 AM

## 2020-03-31 NOTE — Progress Notes (Addendum)
PT Cancellation Note  Patient Details Name: Viren Lebeau MRN: [id number] DOB: 1946/08/11   Cancelled Treatment:    Reason Eval/Treat Not Completed: Patient at procedure or test/unavailable Pt is in dialysis this afternoon, unable to see him this date.  Social work very eager to insure he is seen; will put on weekend list, however pt's willingness to participate has been minimal thus far.  We shall try.  Kreg Shropshire, DPT 03/31/2020, 4:04 PM

## 2020-03-31 NOTE — Progress Notes (Signed)
PROGRESS NOTE    Brian Fleming  AQT:[id number] DOB: 26-Jun-1946 DOA: 03/22/2020 PCP: [facility]      Brief Narrative:  [name] is a 74 y.o. M with HTN, A. fib on Eliquis, ESRD on HD MWF, chronic back pain, A. fib on Eliquis, ESRD on HD MWF, with a history of chronic back pain, who developed worsening of his chronic back pain in the last few months.  Seen in the ER 5 times since Feb, and finally imaging obtained by MRI that showed progressed endplate remodeling at T6-2, unable to exclude discitis, osteomyelitis.        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  IR was consulted for this biopsy, but the patient was not able to tolerate and this was unsuccessful.  Infectious disease was consulted, and we have determined to attempt a trial of IV antibiotics. -Continue lidocaine patch as patient derives relief from this -Continue TLSO brace to be worn at all times when out of bed, per neurosurgery (however patient has refused evaluation) -Outpatient neurosurgery follow-up -Continue vancomycin 1 g with dialysis and ceftazidime 2 g with dialysis for 2 weeks -Trend ESR and CRP -In 2 weeks (on or around 4/20) will reevaluate by ID to determine need for ongoing antibiotics at that time   Essential hypertension Pressure soft -Hold amlodipine, hydralazine -Continue Avapro   End stage renal disease on dialysis MWF(HCC) Secondary Hyperparathyroidism Hyperkalemia -Consult nephrology for maintenance dialysis, appreciate recommendations -Continue sevelamer, lokelma, Rena-Vite  Atrial flutter (HCC) Continue apixaban   Anemia with chronic kidney  disease No indication for EPO at this time.  Neuropathy -Continue gabapentin   Anemia of chronic renal failure Stable, no clinical bleeding         Disposition: The patient was admitted with back pain, and osteomyelitis/discitis could not be ruled out as the cause.  Subsequent work-up was started but the patient's lack of ability to cooperate with aspiration, and so we have plans for 2 weeks of empiric antibiotics with vancomycin and ceftazidime, then reevaluation.  At present, the patient is unable to care for self safely, and plans for safe disposition are still being sought.       MDM: The below labs and imaging reports reviewed and summarized above.  Medication management as above.     DVT prophylaxis: N/A on Eliquis Code Status: FULL Family Communication:     Consultants:     Procedures:     Antimicrobials:      Culture data:              Subjective: The patient provides no new complaints.  He has no focal weakness, numbness, pain.  He feels comfortable.  No back pain.  No confusion.  No vomiting or diarrhea.  Objective: Vitals:   03/31/20 1445 03/31/20 1500 03/31/20 1515 03/31/20 1530  BP: (!) 96/47 (!) 89/50 (!) 88/47 (!) 109/58  Pulse: (!) 49 (!) 49 (!) 50 (!) 56  Resp: (!) '21 12 16 17  '$ Temp:      TempSrc:      SpO2:      Weight:      Height:        Intake/Output Summary (Last 24 hours) at 03/31/2020 1535 Last data filed at 03/31/2020  0800 Gross per 24 hour  Intake 0 ml  Output 0 ml  Net 0 ml   Filed Weights   03/22/20 1609 03/23/20 0727 03/24/20 0920  Weight: 91 kg 91 kg 91 kg    Examination: General appearance: Adult male, lying in bed, elderly, appears disheveled, no acute distress HEENT: Anicteric, conjunctival pink, lids and lashes normal for age, lips dry, edentulous, oropharynx white, no oral lesions, hearing normal Skin: Warm and dry, no jaundice or suspicious rashes or lesions Cardiac: Regular rate and rhythm,  no murmurs appreciated, JVP normal, no lower extremity edema Respiratory: Normal respiratory rate rhythm, lungs clear without rales or wheezes  abdomen: Abdomen soft but there is no patient guarding, no ascites or distention. MSK: No pain to palpation of the vertebral spine, no abnormalities Neuro: Awake and arouses easily, extraocular movements intact, speech fluent, moves upper and lower extremities with generalized weakness but equal strength.    Psych: Affect blunted, judgment insight appear severely impaired   Data Reviewed: I have personally reviewed following labs and imaging studies:  CBC: Recent Labs  Lab 03/27/20 0528 03/28/20 0533 03/29/20 0614 03/31/20 0551 03/31/20 1430  WBC 4.5 5.7 5.5 4.9 4.3  HGB 10.8* 10.6* 10.3* 9.8* 9.3*  HCT 33.6* 32.8* 32.7* 31.4* 29.4*  MCV 98.2 95.9 98.5 99.7 97.7  PLT 171 179 165 171 389   Basic Metabolic Panel: Recent Labs  Lab 03/25/20 0522 03/25/20 0522 03/26/20 0441 03/26/20 0441 03/27/20 0528 03/28/20 0533 03/29/20 0614 03/31/20 0551 03/31/20 0900  NA 138   < > 138   < > 139 141 141 139 137  K 5.1   < > 6.0*   < > 5.2* 5.0 4.2 4.4 3.9  CL 92*   < > 95*   < > 97* 95* 100 96* 97*  CO2 30   < > 29   < > '31 28 29 30 28  '$ GLUCOSE 93   < > 82   < > 72 90 98 104* 153*  BUN 47*   < > 62*   < > 36* 50* 29* 45* 45*  CREATININE 8.15*   < > 9.97*   < > 7.30* 9.31* 6.97* 7.89* 8.56*  CALCIUM 9.0   < > 9.0   < > 9.3 9.8 9.7 10.1 9.4  MG 2.2  --  2.2  --  2.1 2.2 2.0  --   --   PHOS  --   --   --   --  5.8* 7.3* 5.7* 6.6* 6.1*   < > = values in this interval not displayed.   GFR: Estimated Creatinine Clearance: 8.7 mL/min (A) (by C-G formula based on SCr of 8.56 mg/dL (H)). Liver Function Tests: Recent Labs  Lab 03/31/20 0551 03/31/20 0900  ALBUMIN 2.8* 2.5*   No results for input(s): LIPASE, AMYLASE in the last 168 hours. No results for input(s): AMMONIA in the last 168 hours. Coagulation Profile: No results for input(s): INR,  PROTIME in the last 168 hours. Cardiac Enzymes: No results for input(s): CKTOTAL, CKMB, CKMBINDEX, TROPONINI in the last 168 hours. BNP (last 3 results) No results for input(s): PROBNP in the last 8760 hours. HbA1C: No results for input(s): HGBA1C in the last 72 hours. CBG: No results for input(s): GLUCAP in the last 168 hours. Lipid Profile: No results for input(s): CHOL, HDL, LDLCALC, TRIG, CHOLHDL, LDLDIRECT in the last 72 hours. Thyroid Function Tests: No results for input(s): TSH, T4TOTAL, FREET4, T3FREE, THYROIDAB in the last 72 hours.  Anemia Panel: No results for input(s): VITAMINB12, FOLATE, FERRITIN, TIBC, IRON, RETICCTPCT in the last 72 hours. Urine analysis: No results found for: COLORURINE, APPEARANCEUR, LABSPEC, PHURINE, GLUCOSEU, HGBUR, BILIRUBINUR, KETONESUR, PROTEINUR, UROBILINOGEN, NITRITE, LEUKOCYTESUR Sepsis Labs: '@LABRCNTIP'$ (procalcitonin:4,lacticacidven:4)  ) Recent Results (from the past 240 hour(s))  SARS CORONAVIRUS 2 (TAT 6-24 HRS) Nasopharyngeal Nasopharyngeal Swab     Status: None   Collection Time: 03/23/20  8:03 PM   Specimen: Nasopharyngeal Swab  Result Value Ref Range Status   SARS Coronavirus 2 NEGATIVE NEGATIVE Final    Comment: (NOTE) SARS-CoV-2 target nucleic acids are NOT DETECTED. The SARS-CoV-2 RNA is generally detectable in upper and lower respiratory specimens during the acute phase of infection. Negative results do not preclude SARS-CoV-2 infection, do not rule out co-infections with other pathogens, and should not be used as the sole basis for treatment or other patient management decisions. Negative results must be combined with clinical observations, patient history, and epidemiological information. The expected result is Negative. Fact Sheet for Patients: SugarRoll.be Fact Sheet for Healthcare Providers: https://www.woods-mathews.com/ This test is not yet approved or cleared by the [place] FDA and  has been authorized for detection and/or diagnosis of SARS-CoV-2 by FDA under an Emergency Use Authorization (EUA). This EUA will remain  in effect (meaning this test can be used) for the duration of the COVID-19 declaration under Section 56 4(b)(1) of the Act, 21 U.S.C. section 360bbb-3(b)(1), unless the authorization is terminated or revoked sooner. Performed at [facility], [place] [address] [place], [place] [address]          Radiology Studies: No results found.      Scheduled Meds: . amLODipine  10 mg Oral QHS  . apixaban  5 mg Oral BID  . Chlorhexidine Gluconate Cloth  6 each Topical Q0600  . feeding supplement (NEPRO CARB STEADY)  237 mL Oral BID BM  . gabapentin  300 mg Oral QHS  . hydrALAZINE  50 mg Oral Q8H  . lidocaine  2 patch Transdermal BID  . multivitamin  1 tablet Oral QHS  . ramipril  10 mg Oral Daily  . sevelamer carbonate  2,400 mg Oral TID WC  . sodium zirconium cyclosilicate  10 g Oral Daily   Continuous Infusions: . cefTAZidime (FORTAZ)  IV    . [START ON 04/03/2020] vancomycin    . vancomycin       LOS: 8 days    Time spent: 25 minutes    Edwin Dada, MD [facility] 03/31/2020, 3:35 PM     Please page though [place] or Epic secure chat:  For [employer], Adult nurse

## 2020-03-31 NOTE — Progress Notes (Signed)
[facility]  ROUNDING NOTE   Subjective:   Seen and examined on hemodialysis treatment. Tolerating treatment well. Hypotensive but asymptomatic.     HEMODIALYSIS FLOWSHEET:  Blood Flow Rate (mL/min): 400 mL/min Arterial Pressure (mmHg): -160 mmHg Venous Pressure (mmHg): 130 mmHg Transmembrane Pressure (mmHg): 50 mmHg Ultrafiltration Rate (mL/min): 210 mL/min Dialysate Flow Rate (mL/min): 600 ml/min Conductivity: Machine : 14 Conductivity: Machine : 14 Dialysis Fluid Bolus: Normal Saline Bolus Amount (mL): 250 mL    Objective:  Vital signs in last 24 hours:  Temp:  [97.7 F (36.5 C)-98.6 F (37 C)] 97.8 F (36.6 C) (04/09 1425) Pulse Rate:  [42-87] 53 (04/09 1545) Resp:  [12-24] 17 (04/09 1545) BP: (88-123)/(44-58) 91/57 (04/09 1545) SpO2:  [96 %-100 %] 100 % (04/09 0906)  Weight change:  Filed Weights   03/22/20 1609 03/23/20 0727 03/24/20 0920  Weight: 91 kg 91 kg 91 kg    Intake/Output: No intake/output data recorded.   Intake/Output this shift:  No intake/output data recorded.  Physical Exam: General: No acute distress, laying in bed  Head: Normocephalic, atraumatic. Moist oral mucosal membranes  Eyes: Anicteric  Neck: Supple, trachea midline  Lungs:  Clear to auscultation, normal effort  Heart: regular  Abdomen:  Soft, nontender, bowel sounds present  Extremities: No peripheral edema.  Neurologic: Awake, alert, following commands  Skin: No lesions  Access: LUE AVF    Basic Metabolic Panel: Recent Labs  Lab 03/25/20 0522 03/25/20 0522 03/26/20 0441 03/26/20 0441 03/27/20 0528 03/27/20 0528 03/28/20 0533 03/28/20 0533 03/29/20 4098 03/31/20 0551 03/31/20 0900  NA 138   < > 138   < > 139  --  141  --  141 139 137  K 5.1   < > 6.0*   < > 5.2*  --  5.0  --  4.2 4.4 3.9  CL 92*   < > 95*   < > 97*  --  95*  --  100 96* 97*  CO2 30   < > 29   < > 31  --  28  --  29 30 28   GLUCOSE 93   < > 82   < > 72  --  90  --  98 104* 153*   BUN 47*   < > 62*   < > 36*  --  50*  --  29* 45* 45*  CREATININE 8.15*   < > 9.97*   < > 7.30*  --  9.31*  --  6.97* 7.89* 8.56*  CALCIUM 9.0   < > 9.0   < > 9.3   < > 9.8   < > 9.7 10.1 9.4  MG 2.2  --  2.2  --  2.1  --  2.2  --  2.0  --   --   PHOS  --   --   --   --  5.8*  --  7.3*  --  5.7* 6.6* 6.1*   < > = values in this interval not displayed.    Liver Function Tests: Recent Labs  Lab 03/31/20 0551 03/31/20 0900  ALBUMIN 2.8* 2.5*   No results for input(s): LIPASE, AMYLASE in the last 168 hours. No results for input(s): AMMONIA in the last 168 hours.  CBC: Recent Labs  Lab 03/27/20 0528 03/28/20 0533 03/29/20 0614 03/31/20 0551 03/31/20 1430  WBC 4.5 5.7 5.5 4.9 4.3  HGB 10.8* 10.6* 10.3* 9.8* 9.3*  HCT 33.6* 32.8* 32.7* 31.4* 29.4*  MCV 98.2 95.9  98.5 99.7 97.7  PLT 171 179 165 171 157    Cardiac Enzymes: No results for input(s): CKTOTAL, CKMB, CKMBINDEX, TROPONINI in the last 168 hours.  BNP: Invalid input(s): POCBNP  CBG: No results for input(s): GLUCAP in the last 168 hours.  Microbiology: Results for orders placed or performed during the hospital encounter of 03/22/20  SARS CORONAVIRUS 2 (TAT 6-24 HRS) Nasopharyngeal Nasopharyngeal Swab     Status: None   Collection Time: 03/23/20  8:03 PM   Specimen: Nasopharyngeal Swab  Result Value Ref Range Status   SARS Coronavirus 2 NEGATIVE NEGATIVE Final    Comment: (NOTE) SARS-CoV-2 target nucleic acids are NOT DETECTED. The SARS-CoV-2 RNA is generally detectable in upper and lower respiratory specimens during the acute phase of infection. Negative results do not preclude SARS-CoV-2 infection, do not rule out co-infections with other pathogens, and should not be used as the sole basis for treatment or other patient management decisions. Negative results must be combined with clinical observations, patient history, and epidemiological information. The expected result is Negative. Fact Sheet for  Patients: SugarRoll.be Fact Sheet for Healthcare Providers: https://www.woods-mathews.com/ This test is not yet approved or cleared by the [place] FDA and  has been authorized for detection and/or diagnosis of SARS-CoV-2 by FDA under an Emergency Use Authorization (EUA). This EUA will remain  in effect (meaning this test can be used) for the duration of the COVID-19 declaration under Section 56 4(b)(1) of the Act, 21 U.S.C. section 360bbb-3(b)(1), unless the authorization is terminated or revoked sooner. Performed at [facility], [place] [address], [place], [place] [address]     Coagulation Studies: No results for input(s): LABPROT, INR in the last 72 hours.  Urinalysis: No results for input(s): COLORURINE, LABSPEC, PHURINE, GLUCOSEU, HGBUR, BILIRUBINUR, KETONESUR, PROTEINUR, UROBILINOGEN, NITRITE, LEUKOCYTESUR in the last 72 hours.  Invalid input(s): APPERANCEUR    Imaging: No results found.   Medications:   . cefTAZidime (FORTAZ)  IV    . [START ON 04/03/2020] vancomycin    . vancomycin     . apixaban  5 mg Oral BID  . Chlorhexidine Gluconate Cloth  6 each Topical Q0600  . feeding supplement (NEPRO CARB STEADY)  237 mL Oral BID BM  . gabapentin  300 mg Oral QHS  . lidocaine  2 patch Transdermal BID  . multivitamin  1 tablet Oral QHS  . ramipril  10 mg Oral Daily  . sevelamer carbonate  2,400 mg Oral TID WC  . sodium zirconium cyclosilicate  10 g Oral Daily   HYDROcodone-acetaminophen, labetalol, ondansetron **OR** ondansetron (ZOFRAN) IV, senna-docusate  Assessment/ Plan:  [name] is a 74 y.o. black male with end-stage renal disease on hemodialysis, hypertension, hyperlipidemia, degenerative disc disease, admitted to [facility] on 03/22/2020 for Back pain [M54.9] Intractable back pain [M54.9] Acute midline low back pain without sciatica [M54.5]  [facility] [address] MWF  1.  ESRD on HD MWF: with  hyperkalemia.  Seen and examined on hemodialysis treatments.   2. Hypertension:  Hypotensive on treatment.  - ramipril, amlodipine and hydralazine.   3. Secondary Hyperparathyroidism:  - sevelamer with meals.   4. Anemia with chronic kidney disease:  hemoglobin 9.3 EPO with HD treatment  5. Disciitis: unable to get biopsy - empiric antibiotics: ceftazidime 2g and vancomycin 1g for each hemodialysis treatment for six weeks. End date May 19.    LOS: 8 Shar Paez 4/9/20214:00 PM

## 2020-04-01 IMAGING — MR MR LUMBAR SPINE W/O CM
4 of 5 series · 30 of 48 positions shown · non-contrast
Comparison: Thoracolumbar spine MRI 07/11/2019

CLINICAL DATA: Back pain

EXAM:
MRI THORACIC AND LUMBAR SPINE WITHOUT CONTRAST
TECHNIQUE: Multiplanar and multiecho pulse sequences of the thoracic and lumbar
spine were obtained without intravenous contrast.

[Series 23: T2 · sagittal · 4.0mm · 0.81mm/px · 6 of 17 slices shown (1 of 2)]
[im 1/17]
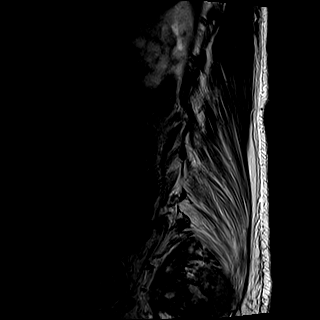
[im 4/17]
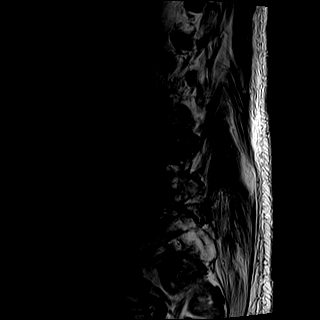
[im 7/17]
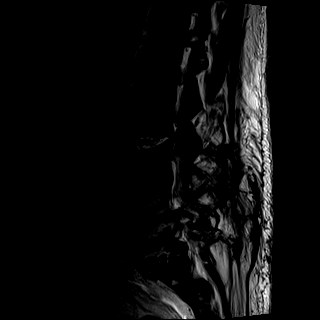
[im 10/17]
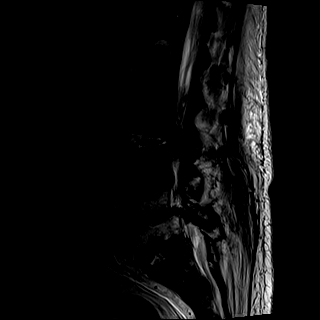
[im 13/17]
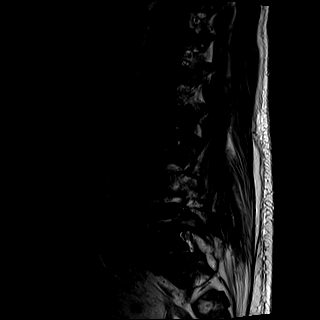
[im 17/17]
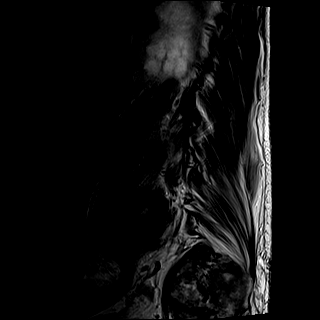

[Series 24: T1 · sagittal · 4.0mm · 0.81mm/px · 6 of 17 slices shown (1 of 2)]
[im 1/17]
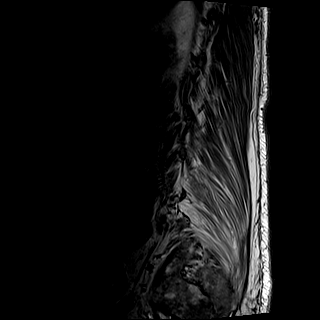
[im 4/17]
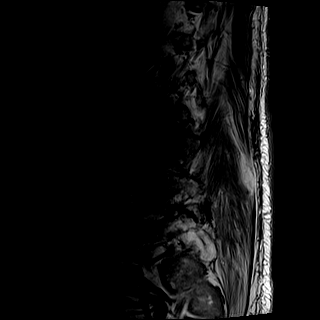
[im 7/17]
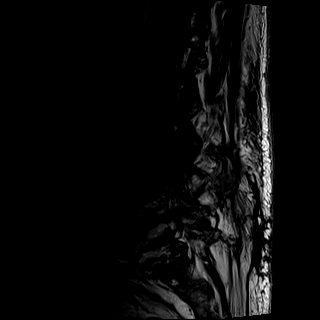
[im 10/17]
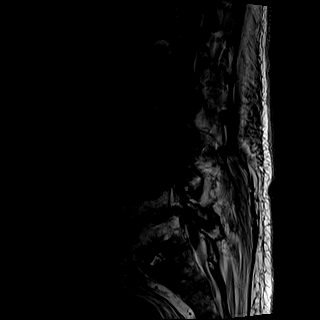
[im 13/17]
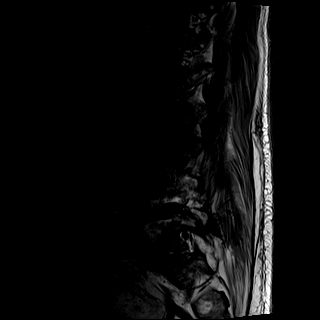
[im 17/17]
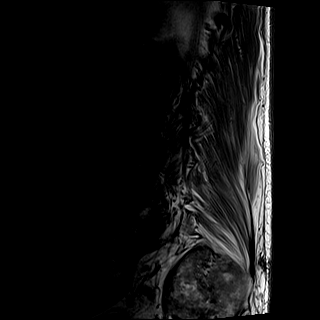

[Series 26: T2 · axial · 4.0mm · 0.78mm/px · z∈[-349,-154]mm · 9 of 40 slices shown (2 of 2)]
[im 1/40]
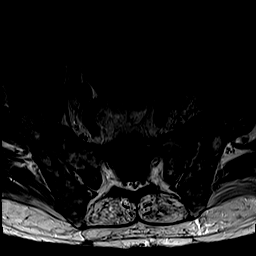
[im 6/40]
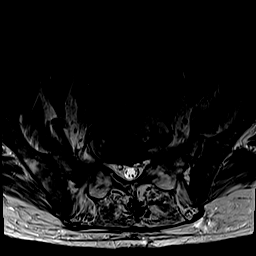
[im 12/40]
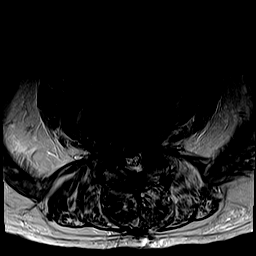
[im 17/40]
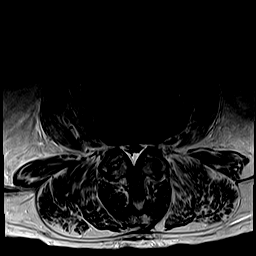
[im 20/40]
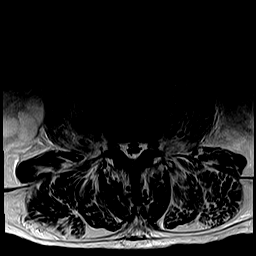
[im 23/40]
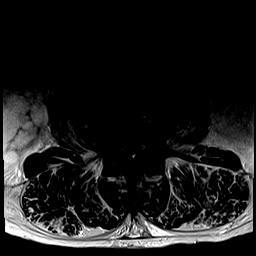
[im 28/40]
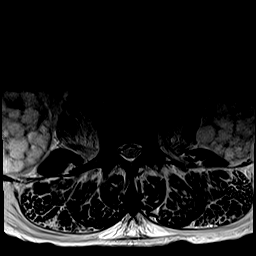
[im 34/40]
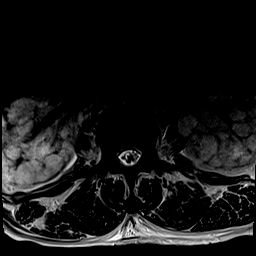
[im 40/40]
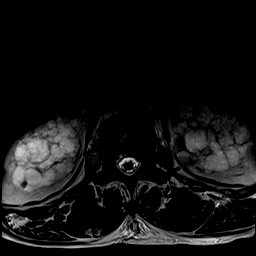

[Series 27: T1 · axial · 4.0mm · 0.39mm/px · z∈[-349,-154]mm · 9 of 40 slices shown (2 of 2)]
[im 1/40]
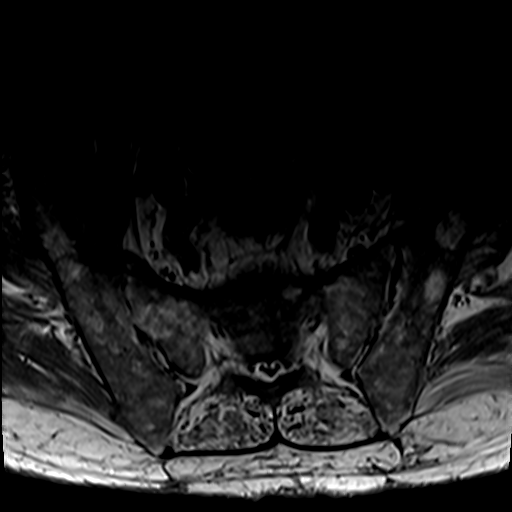
[im 6/40]
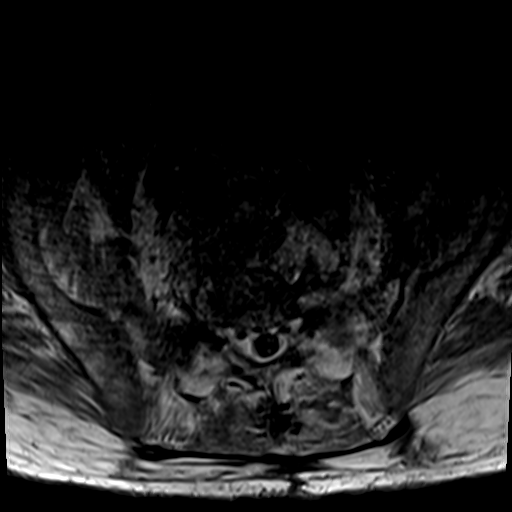
[im 12/40]
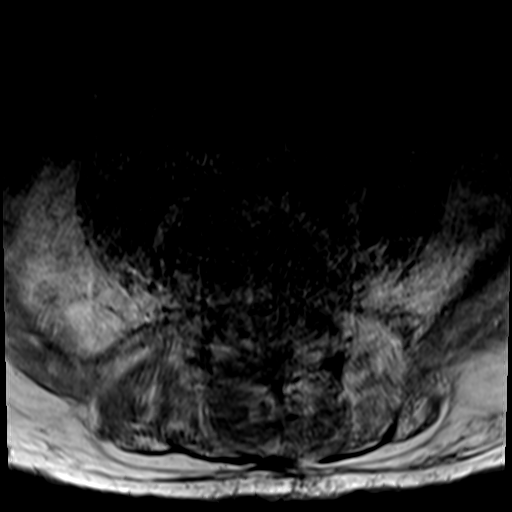
[im 17/40]
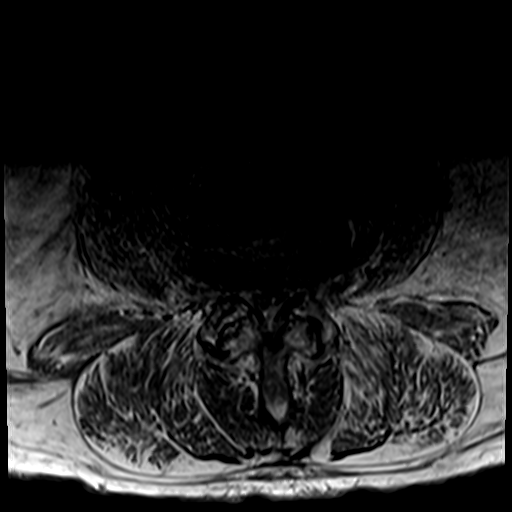
[im 20/40]
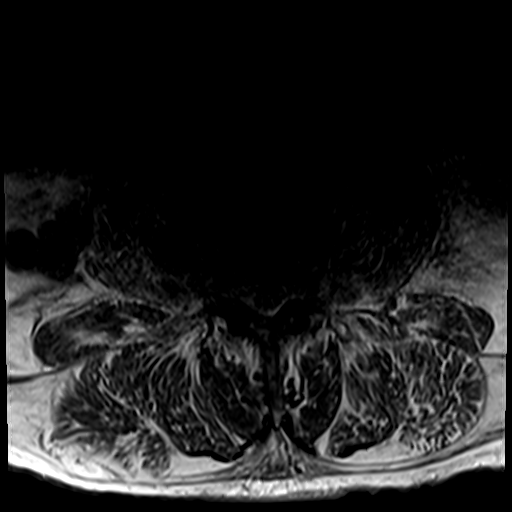
[im 23/40]
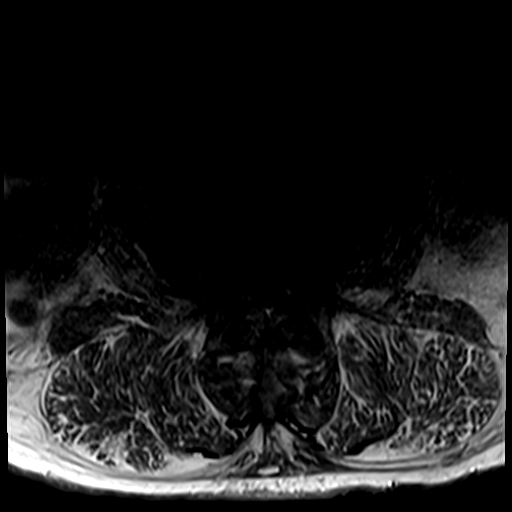
[im 28/40]
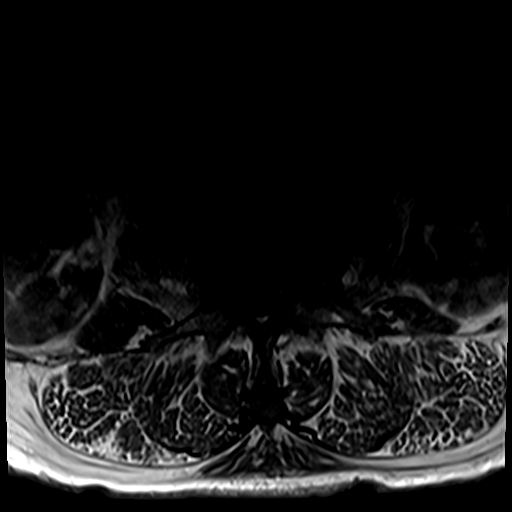
[im 34/40]
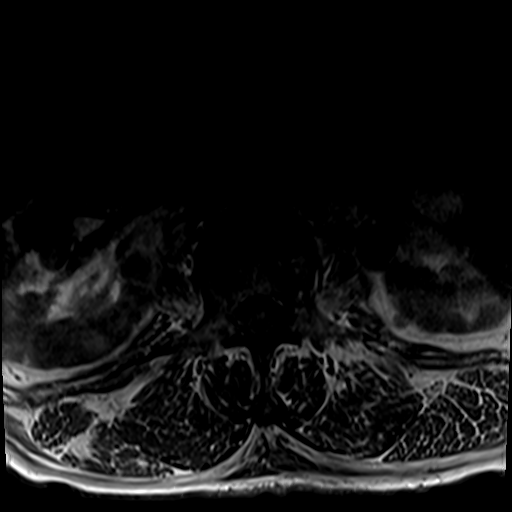
[im 40/40]
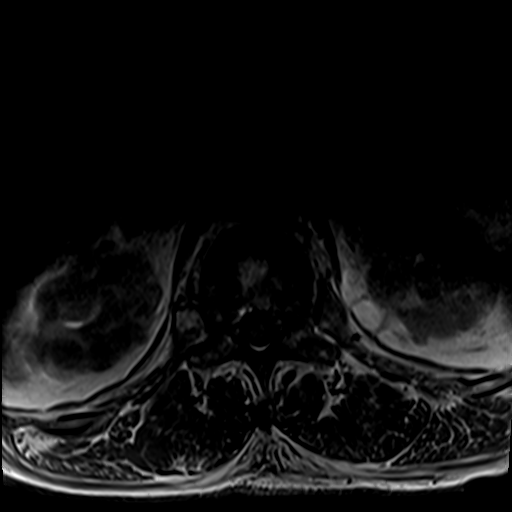

[30 of 48 positions shown; findings below may reference images not displayed]

FINDINGS: MRI THORACIC SPINE FINDINGS

Alignment:  Physiologic.

Vertebrae: Endplate remodeling at T9-10 is unchanged compared to
07/11/2019. There is multifocal bone marrow heterogeneity, also
unchanged. No acute abnormality.

Cord: Assessment of the spinal cord is degraded by motion artifact.
There may be a small area of hyperintense T2-weighted signal within
the spinal cord at T9-10.

Paraspinal and other soft tissues: Negative

Disc levels:

T9-10: Diffuse disc bulge with narrowing of the ventral thecal sac.
No spinal canal stenosis.

Other disc levels are unremarkable.

MRI LUMBAR SPINE FINDINGS

Segmentation:  Normal

Alignment: Grade 1 retrolisthesis at L3-4 and grade 1
anterolisthesis at L4-5

Vertebrae: Heterogeneous bone marrow signal with type 2 Modic
changes at L4-5. There is edema within the L2-3 disc space. No
adjacent endplate edema. There is low T1-weighted signal throughout
most of the lumbar spine.

Conus medullaris and cauda equina: Conus extends to the L1-2 level.
Conus and cauda equina appear normal.

Paraspinal and other soft tissues: Atrophy of the psoas musculature.
Diffuse cystic change of both kidneys.

Disc levels:

T12-L1: Normal disc space and facet joints. There is no spinal canal
stenosis. No neural foraminal stenosis.

L1-L2: Normal disc space and facet joints. There is no spinal canal
stenosis. No neural foraminal stenosis.

L2-L3: Disc space edema with diffuse bulge and large osteophytes.
Unchanged severe spinal canal stenosis. Unchanged severe bilateral
neural foraminal stenosis.

L3-L4: Diffuse disc bulge, slightly worsened. Slightly worsened
severe spinal canal stenosis. No neural foraminal stenosis.

L4-L5: Normal disc space and facet joints. Severe disc space
narrowing with intermediate sized disc osteophyte complex and severe
facet arthrosis. Unchanged severe spinal canal stenosis. Unchanged
severe bilateral neural foraminal stenosis.

L5-S1: Mild disc bulge and moderate facet arthrosis. There is no
spinal canal stenosis. Unchanged mild left neural foraminal
stenosis.

Visualized sacrum: Normal.
IMPRESSION: 1. Mild progression of endplate remodeling at L2-3 with slightly
increased disc space edema. No edema within the endplates. Findings
remain suggestive of hemodialysis associated spondyloarthropathy,
though chronic discitis-osteomyelitis would be difficult to exclude.
Correlation with CRP might be helpful.
2. Unchanged severe lumbar spinal canal stenosis and severe
bilateral neural foraminal stenosis at L2-3 and L4-5 with marked
mass effect on the cauda equina nerve roots at these levels.
3. Endplate remodeling at T9-10, unchanged and likely secondary to
hemodialysis associated spondyloarthropathy.

## 2020-04-01 IMAGING — MR MR THORACIC SPINE W/O CM
6 of 7 series · 33 of 48 positions shown · non-contrast
Comparison: Thoracolumbar spine MRI 07/11/2019

CLINICAL DATA: Back pain

EXAM:
MRI THORACIC AND LUMBAR SPINE WITHOUT CONTRAST
TECHNIQUE: Multiplanar and multiecho pulse sequences of the thoracic and lumbar
spine were obtained without intravenous contrast.

[Series 22: T1 · sagittal · 6.0mm · 1.88mm/px · 3 of 9 slices shown (1 of 2)]
[im 1/9]
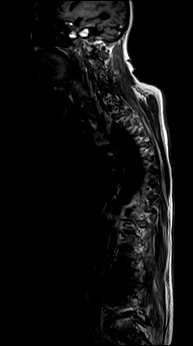
[im 5/9]
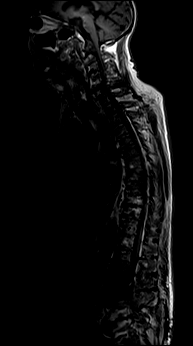
[im 9/9]
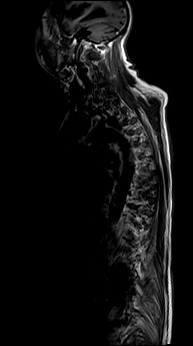

[Series 28: T2 · sagittal · 3.0mm · 1.06mm/px · 5 of 21 slices shown (1 of 3)]
[im 1/21]
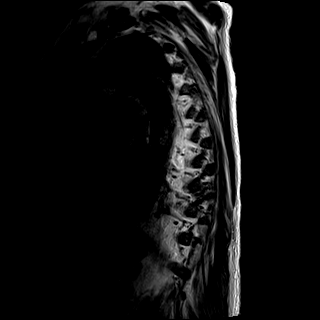
[im 6/21]
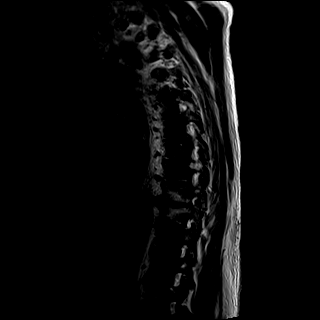
[im 11/21]
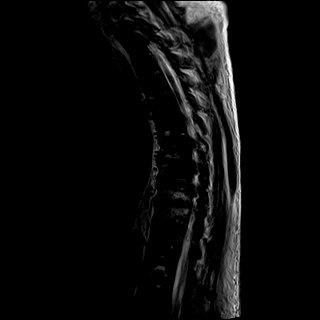
[im 16/21]
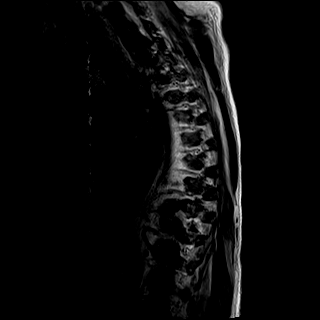
[im 21/21]
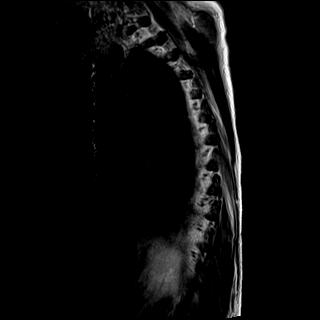

[Series 29: T1 · sagittal · 3.0mm · 1.06mm/px · 5 of 21 slices shown (2 of 2)]
[im 1/21]
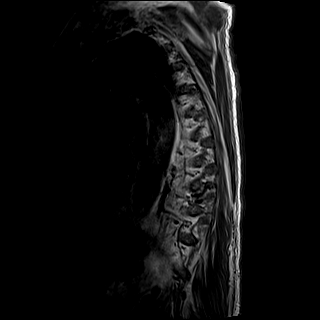
[im 6/21]
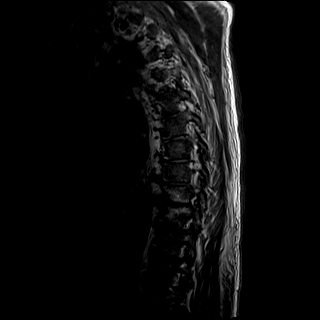
[im 11/21]
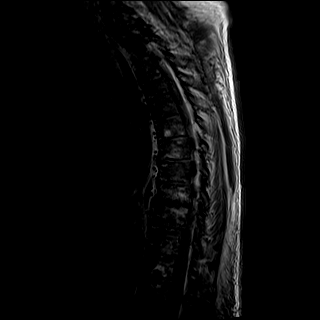
[im 16/21]
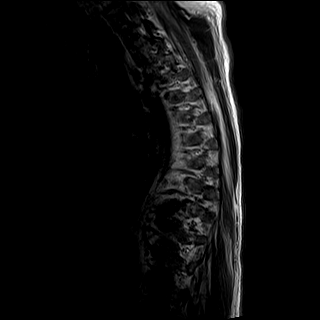
[im 21/21]
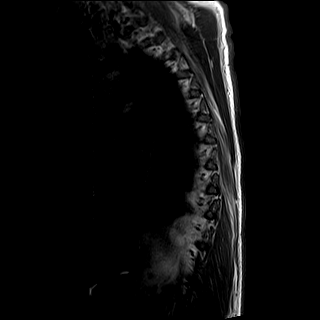

[Series 30: STIR · sagittal · 3.0mm · 0.53mm/px · 4 of 21 slices shown]
[im 1/21]
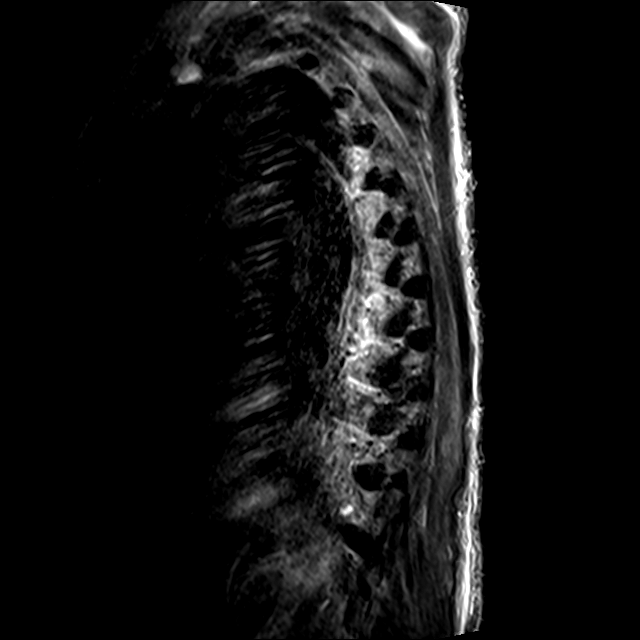
[im 6/21]
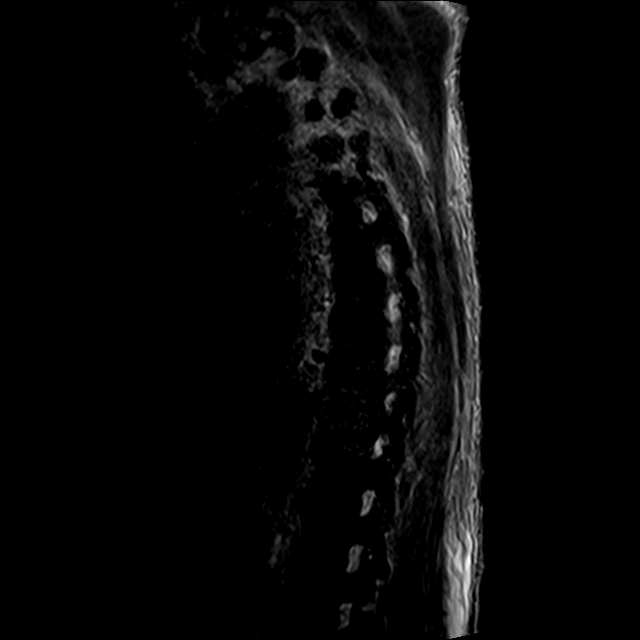
[im 11/21]
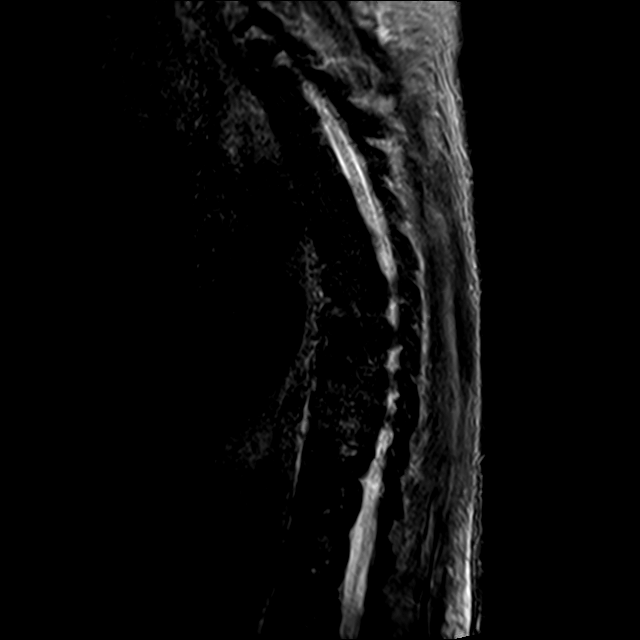
[im 16/21]
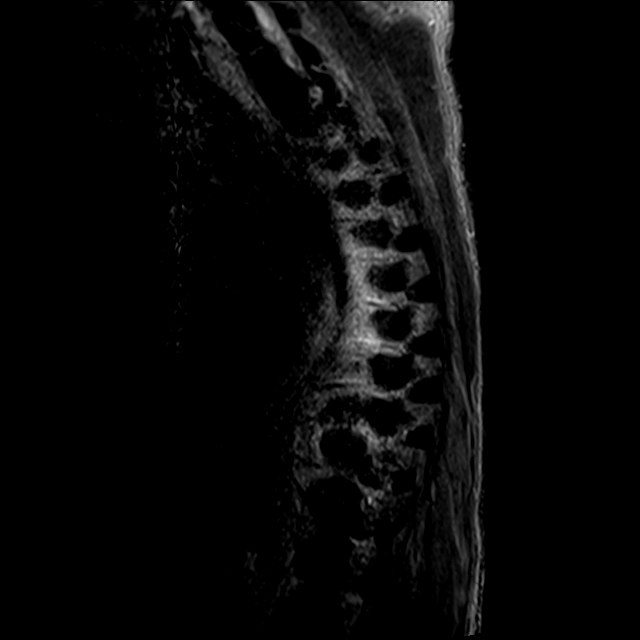

[Series 31: T2 · axial · 4.0mm · 0.59mm/px · z∈[-163,+56]mm · 8 of 39 slices shown (2 of 3)]
[im 1/39]
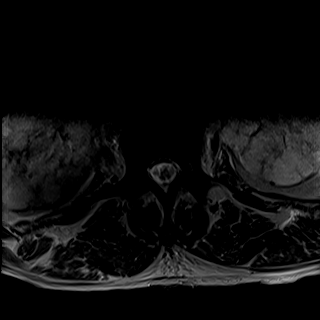
[im 5/39]
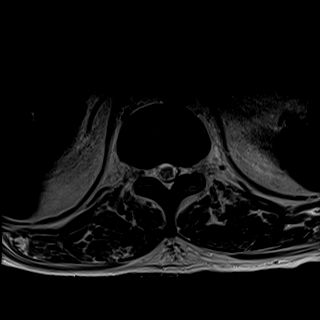
[im 13/39]
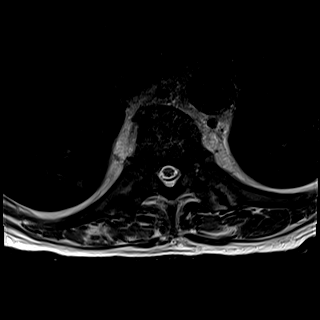
[im 17/39]
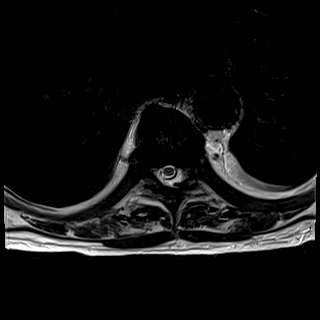
[im 22/39]
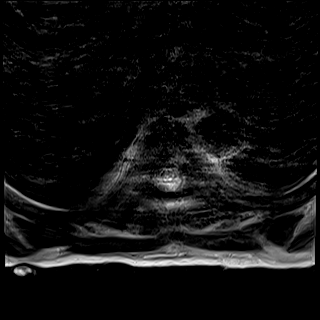
[im 26/39]
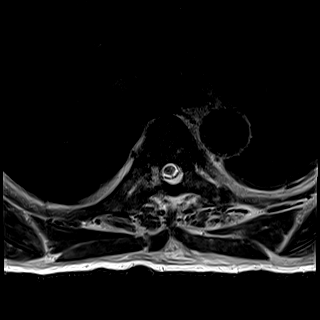
[im 34/39]
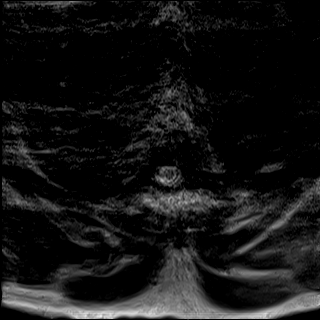
[im 39/39]
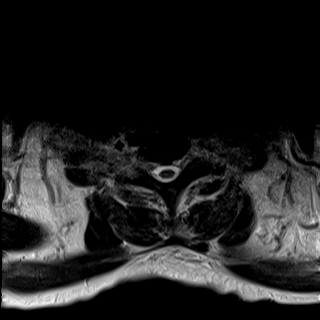

[Series 33: T2 · axial · 4.0mm · 0.74mm/px · z∈[-163,+56]mm · 8 of 39 slices shown (3 of 3)]
[im 1/39]
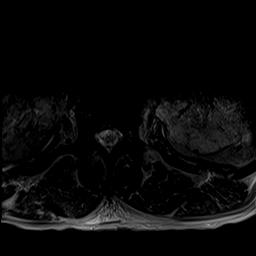
[im 5/39]
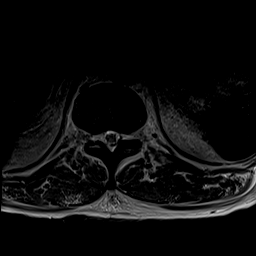
[im 13/39]
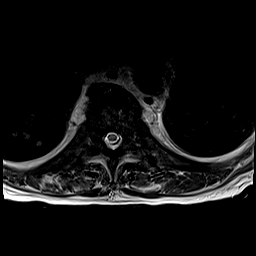
[im 17/39]
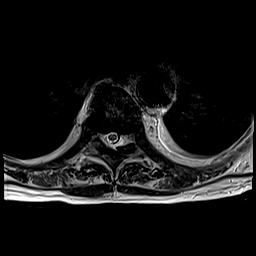
[im 22/39]
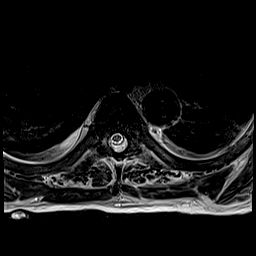
[im 26/39]
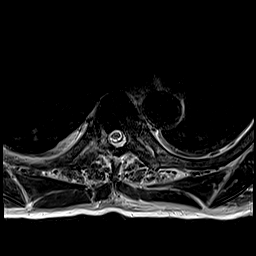
[im 34/39]
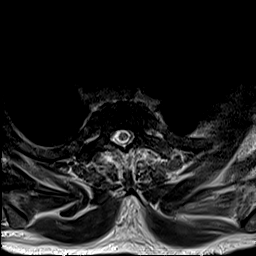
[im 39/39]
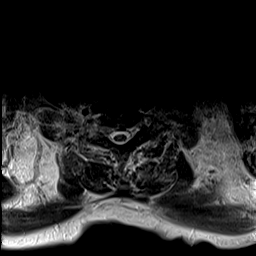

[33 of 48 positions shown; findings below may reference images not displayed]

FINDINGS: MRI THORACIC SPINE FINDINGS

Alignment:  Physiologic.

Vertebrae: Endplate remodeling at T9-10 is unchanged compared to
07/11/2019. There is multifocal bone marrow heterogeneity, also
unchanged. No acute abnormality.

Cord: Assessment of the spinal cord is degraded by motion artifact.
There may be a small area of hyperintense T2-weighted signal within
the spinal cord at T9-10.

Paraspinal and other soft tissues: Negative

Disc levels:

T9-10: Diffuse disc bulge with narrowing of the ventral thecal sac.
No spinal canal stenosis.

Other disc levels are unremarkable.

MRI LUMBAR SPINE FINDINGS

Segmentation:  Normal

Alignment: Grade 1 retrolisthesis at L3-4 and grade 1
anterolisthesis at L4-5

Vertebrae: Heterogeneous bone marrow signal with type 2 Modic
changes at L4-5. There is edema within the L2-3 disc space. No
adjacent endplate edema. There is low T1-weighted signal throughout
most of the lumbar spine.

Conus medullaris and cauda equina: Conus extends to the L1-2 level.
Conus and cauda equina appear normal.

Paraspinal and other soft tissues: Atrophy of the psoas musculature.
Diffuse cystic change of both kidneys.

Disc levels:

T12-L1: Normal disc space and facet joints. There is no spinal canal
stenosis. No neural foraminal stenosis.

L1-L2: Normal disc space and facet joints. There is no spinal canal
stenosis. No neural foraminal stenosis.

L2-L3: Disc space edema with diffuse bulge and large osteophytes.
Unchanged severe spinal canal stenosis. Unchanged severe bilateral
neural foraminal stenosis.

L3-L4: Diffuse disc bulge, slightly worsened. Slightly worsened
severe spinal canal stenosis. No neural foraminal stenosis.

L4-L5: Normal disc space and facet joints. Severe disc space
narrowing with intermediate sized disc osteophyte complex and severe
facet arthrosis. Unchanged severe spinal canal stenosis. Unchanged
severe bilateral neural foraminal stenosis.

L5-S1: Mild disc bulge and moderate facet arthrosis. There is no
spinal canal stenosis. Unchanged mild left neural foraminal
stenosis.

Visualized sacrum: Normal.
IMPRESSION: 1. Mild progression of endplate remodeling at L2-3 with slightly
increased disc space edema. No edema within the endplates. Findings
remain suggestive of hemodialysis associated spondyloarthropathy,
though chronic discitis-osteomyelitis would be difficult to exclude.
Correlation with CRP might be helpful.
2. Unchanged severe lumbar spinal canal stenosis and severe
bilateral neural foraminal stenosis at L2-3 and L4-5 with marked
mass effect on the cauda equina nerve roots at these levels.
3. Endplate remodeling at T9-10, unchanged and likely secondary to
hemodialysis associated spondyloarthropathy.

## 2020-04-01 NOTE — Progress Notes (Signed)
Physical Therapy Treatment Patient Details Name: Brian Fleming MRN: [id number] DOB: 06/18/1946 Today's Date: 04/01/2020    History of Present Illness Brian Fleming is a 74 y.o. male with medical history significant for hypertension, A. fib on Eliquis, ESRD on HD MWF, with a history of chronic back pain, hospitalized in February 2021 for intractable back pain who returns to the emergency room with a complaint of back pain that has been worse over the past 3 days.  Denies weakness in the extremities or in the groin area, denies bladder or bowel problems.    PT Comments    Pt was asleep in side lying upon therapist arriving. He required max encouragement to participate but eventually agreeable. He does become slightly agitated with therapist but was willing to trial desired task. He required max assist to log roll R to short sit with increased time and heavy use of bed rail. PT very surprised at how weak he was and reports several times that he was too weak to continue session. With encouragement continues to participate. LSO applied while seated EOB after lengthy discussion about safety and why MD wants him to wear it. He stood 3 x EOB ~ 1 minute each trial with poor flexed posture. Pt impulsively sits without warning 2/2 to fatigue and pain. He required max assist to safely return to supine in bed. Pt reports he wants pain meds and RN notified. Therapist feels pt will benefit from SNF at DC to address deficits with strength, balance, and overall safe functional mobility. PT will continue efforts to progress pt as able per pt willingness.      Follow Up Recommendations  SNF     Equipment Recommendations  Other (comment)(defer to next level of care)    Recommendations for Other Services       Precautions / Restrictions Precautions Precautions: Fall Required Braces or Orthoses: Spinal Brace(LSO) Spinal Brace: Other (comment)(LSO) Spinal Brace Comments: Pt required max encouragement to  wear LSO. applied in sitting Restrictions Weight Bearing Restrictions: No    Mobility  Bed Mobility Overal bed mobility: Needs Assistance Bed Mobility: Supine to Sit;Rolling;Sidelying to Sit;Sit to Sidelying;Sit to Supine Rolling: Mod assist Sidelying to sit: Max assist Supine to sit: Max assist Sit to supine: Max assist Sit to sidelying: Mod assist General bed mobility comments: Pt overall required max assist + vcs for technique sequencing and safety. heavy use of bedrails. pt is slightly impulsive with movements  Transfers Overall transfer level: Needs assistance Equipment used: Rolling walker (2 wheeled) Transfers: Sit to/from Stand Sit to Stand: Mod assist;From elevated surface         General transfer comment: Pt stood 3 x EOB x ~ 1 minute each trial. he fatigues quickly and complains of knee/thigh pain + back pain. poor standing posture throughout.  Ambulation/Gait             General Gait Details: unsafe to progres to ambulation 2/2 to pt's pain/impulsivity/ safe ability to follow commands   Stairs             Wheelchair Mobility    Modified Rankin (Stroke Patients Only)       Balance Overall balance assessment: Needs assistance Sitting-balance support: Bilateral upper extremity supported;Feet supported Sitting balance-Leahy Scale: Fair Sitting balance - Comments: pt was able to tolerate sitting EOB but required max encouragement to maintain sitting. attempotiong to lay back into bed several times. max ancouragement to wear LSO   Standing balance support: Bilateral upper extremity supported;During functional activity  Standing balance-Leahy Scale: Fair Standing balance comment: Pt was unable to stand fully erect at EOB 2/2 to pain. he stood 3 x ~ 1 minute with heavy use of UE on RW for support. Pt impulsively sits without warning                            Cognition Arousal/Alertness: Lethargic Behavior During Therapy: Agitated Overall  Cognitive Status: No family/caregiver present to determine baseline cognitive functioning                                 General Comments: Pt unwilling/able to recall orientation questioning. pt get aggitated easily but was willing to participate with max encouragement/motivation      Exercises      General Comments        Pertinent Vitals/Pain Pain Assessment: 0-10 Pain Score: 8  Faces Pain Scale: Hurts whole lot Pain Location: back Pain Descriptors / Indicators: Moaning;Sharp;Grimacing Pain Intervention(s): Limited activity within patient's tolerance;Monitored during session;Repositioned    Home Living                      Prior Function            PT Goals (current goals can now be found in the care plan section) Acute Rehab PT Goals Patient Stated Goal: To go home Progress towards PT goals: Progressing toward goals    Frequency    Min 2X/week      PT Plan Discharge plan needs to be updated    Co-evaluation              AM-PAC PT "6 Clicks" Mobility   Outcome Measure  Help needed turning from your back to your side while in a flat bed without using bedrails?: A Lot Help needed moving from lying on your back to sitting on the side of a flat bed without using bedrails?: A Lot Help needed moving to and from a bed to a chair (including a wheelchair)?: A Lot Help needed standing up from a chair using your arms (e.g., wheelchair or bedside chair)?: A Lot Help needed to walk in hospital room?: A Lot Help needed climbing 3-5 steps with a railing? : Total 6 Click Score: 11    End of Session Equipment Utilized During Treatment: Gait belt;Other (comment);Back brace(LSO) Activity Tolerance: Patient limited by pain Patient left: in bed;with call bell/phone within reach;with bed alarm set;with nursing/sitter in room Nurse Communication: Mobility status;Patient requests pain meds PT Visit Diagnosis: Difficulty in walking, not elsewhere  classified (R26.2);Muscle weakness (generalized) (M62.81);Pain Pain - part of body: (BLEs (knee/thighs) and low back)     Time: 0938-1829 PT Time Calculation (min) (ACUTE ONLY): 25 min  Charges:  $Therapeutic Activity: 23-37 mins                     Julaine Fusi PTA 04/01/20, 9:24 AM

## 2020-04-01 NOTE — Progress Notes (Signed)
[facility]  ROUNDING NOTE   Subjective:   Doing fair No acute c/o UF 1500 cc yesterday  Objective:  Vital signs in last 24 hours:  Temp:  [97.4 F (36.3 C)-98.5 F (36.9 C)] 97.4 F (36.3 C) (04/10 1247) Pulse Rate:  [43-86] 43 (04/10 1247) Resp:  [15-20] 16 (04/10 1247) BP: (87-139)/(50-76) 127/56 (04/10 1247) SpO2:  [93 %-100 %] 100 % (04/10 1247)  Weight change:  Filed Weights   03/22/20 1609 03/23/20 0727 03/24/20 0920  Weight: 91 kg 91 kg 91 kg    Intake/Output: I/O last 3 completed shifts: In: 49.2 [IV Piggyback:49.2] Out: 1560 [Other:1560]   Intake/Output this shift:  Total I/O In: 260 [P.O.:260] Out: -   Physical Exam: General: No acute distress, laying in bed  Head: Normocephalic, atraumatic. Moist oral mucosal membranes  Eyes: Anicteric  Neck: Supple,    Lungs:  Clear to auscultation, normal effort  Heart: regular  Abdomen:  Soft, nontender, bowel sounds present  Extremities: No peripheral edema.  Neurologic: Awake, alert, following commands  Skin: No lesions  Access: LUE AVF    Basic Metabolic Panel: Recent Labs  Lab 03/26/20 0441 03/26/20 0441 03/27/20 0528 03/27/20 0528 03/28/20 0533 03/28/20 0533 03/29/20 0614 03/31/20 0551 03/31/20 0900  NA 138   < > 139  --  141  --  141 139 137  K 6.0*   < > 5.2*  --  5.0  --  4.2 4.4 3.9  CL 95*   < > 97*  --  95*  --  100 96* 97*  CO2 29   < > 31  --  28  --  29 30 28   GLUCOSE 82   < > 72  --  90  --  98 104* 153*  BUN 62*   < > 36*  --  50*  --  29* 45* 45*  CREATININE 9.97*   < > 7.30*  --  9.31*  --  6.97* 7.89* 8.56*  CALCIUM 9.0   < > 9.3   < > 9.8   < > 9.7 10.1 9.4  MG 2.2  --  2.1  --  2.2  --  2.0  --   --   PHOS  --   --  5.8*  --  7.3*  --  5.7* 6.6* 6.1*   < > = values in this interval not displayed.    Liver Function Tests: Recent Labs  Lab 03/31/20 0551 03/31/20 0900  ALBUMIN 2.8* 2.5*   No results for input(s): LIPASE, AMYLASE in the last 168 hours. No  results for input(s): AMMONIA in the last 168 hours.  CBC: Recent Labs  Lab 03/27/20 0528 03/28/20 0533 03/29/20 0614 03/31/20 0551 03/31/20 1430  WBC 4.5 5.7 5.5 4.9 4.3  HGB 10.8* 10.6* 10.3* 9.8* 9.3*  HCT 33.6* 32.8* 32.7* 31.4* 29.4*  MCV 98.2 95.9 98.5 99.7 97.7  PLT 171 179 165 171 157    Cardiac Enzymes: No results for input(s): CKTOTAL, CKMB, CKMBINDEX, TROPONINI in the last 168 hours.  BNP: Invalid input(s): POCBNP  CBG: No results for input(s): GLUCAP in the last 168 hours.  Microbiology: Results for orders placed or performed during the hospital encounter of 03/22/20  SARS CORONAVIRUS 2 (TAT 6-24 HRS) Nasopharyngeal Nasopharyngeal Swab     Status: None   Collection Time: 03/23/20  8:03 PM   Specimen: Nasopharyngeal Swab  Result Value Ref Range Status   SARS Coronavirus 2 NEGATIVE NEGATIVE Final  Comment: (NOTE) SARS-CoV-2 target nucleic acids are NOT DETECTED. The SARS-CoV-2 RNA is generally detectable in upper and lower respiratory specimens during the acute phase of infection. Negative results do not preclude SARS-CoV-2 infection, do not rule out co-infections with other pathogens, and should not be used as the sole basis for treatment or other patient management decisions. Negative results must be combined with clinical observations, patient history, and epidemiological information. The expected result is Negative. Fact Sheet for Patients: SugarRoll.be Fact Sheet for Healthcare Providers: https://www.woods-mathews.com/ This test is not yet approved or cleared by the [place] FDA and  has been authorized for detection and/or diagnosis of SARS-CoV-2 by FDA under an Emergency Use Authorization (EUA). This EUA will remain  in effect (meaning this test can be used) for the duration of the COVID-19 declaration under Section 56 4(b)(1) of the Act, 21 U.S.C. section 360bbb-3(b)(1), unless the authorization is  terminated or revoked sooner. Performed at [facility], [place] [address] [place], [place] [address]     Coagulation Studies: No results for input(s): LABPROT, INR in the last 72 hours.  Urinalysis: No results for input(s): COLORURINE, LABSPEC, PHURINE, GLUCOSEU, HGBUR, BILIRUBINUR, KETONESUR, PROTEINUR, UROBILINOGEN, NITRITE, LEUKOCYTESUR in the last 72 hours.  Invalid input(s): APPERANCEUR    Imaging: No results found.   Medications:   . sodium chloride Stopped (03/31/20 1958)  . cefTAZidime (FORTAZ)  IV Stopped (03/31/20 2030)  . [START ON 04/03/2020] vancomycin Stopped (03/31/20 1811)   . apixaban  5 mg Oral BID  . Chlorhexidine Gluconate Cloth  6 each Topical Q0600  . [START ON 04/03/2020] epoetin (EPOGEN/PROCRIT) injection  10,000 Units Intravenous Q M,W,F-HD  . feeding supplement (NEPRO CARB STEADY)  237 mL Oral BID BM  . gabapentin  300 mg Oral QHS  . lidocaine  2 patch Transdermal BID  . multivitamin  1 tablet Oral QHS  . ramipril  10 mg Oral Daily  . sevelamer carbonate  2,400 mg Oral TID WC  . sodium zirconium cyclosilicate  10 g Oral Daily   sodium chloride, HYDROcodone-acetaminophen, labetalol, ondansetron **OR** ondansetron (ZOFRAN) IV, senna-docusate  Assessment/ Plan:  [name] is a 74 y.o. black male with end-stage renal disease on hemodialysis, hypertension, hyperlipidemia, degenerative disc disease, admitted to [facility] on 03/22/2020 for Back pain [M54.9] Intractable back pain [M54.9] Acute midline low back pain without sciatica [M54.5]  [facility] [address] MWF  #  ESRD on HD MWF: with hyperkalemia.  Next HD planned on Monday  #  Secondary Hyperparathyroidism:  Lab Results  Component Value Date   PTH 242 (H) 02/08/2020   CALCIUM 9.4 03/31/2020   PHOS 6.1 (H) 03/31/2020  - sevelamer with meals.   # Anemia with chronic kidney disease: Lab Results  Component Value Date   HGB 9.3 (L) 03/31/2020   EPO with HD  treatment  # Disciitis: unable to get biopsy - empiric antibiotics: ceftazidime 2g and vancomycin 1g for each hemodialysis treatment for six weeks. End date May 19.    LOS: [place] 4/10/20214:10 PM

## 2020-04-01 NOTE — Progress Notes (Signed)
PROGRESS NOTE    Brian Fleming  PNT:[id number] DOB: 1946-04-14 DOA: 03/22/2020 PCP: [facility]      Brief Narrative:  Brian Fleming is a 74 y.o. M with HTN, A. fib on Eliquis, ESRD on HD MWF, chronic back pain, A. fib on Eliquis, ESRD on HD MWF, with a history of chronic back pain, who developed worsening of his chronic back pain in the last few months.  Seen in the ER 5 times since Feb, and finally imaging obtained by MRI that showed progressed endplate remodeling at G8-6, unable to exclude discitis, osteomyelitis.        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  IR was consulted for this biopsy, but the patient was not able to tolerate and this was unsuccessful.  Infectious disease was consulted, and we have determined to attempt a trial of IV antibiotics. -Continue lidocaine patch as patient derives relief from this -Continue TLSO brace to be worn at all times when out of bed, per neurosurgery (however patient has refused evaluation) -Outpatient neurosurgery follow-up -Continue vancomycin 1 g with dialysis and ceftazidime 2 g with dialysis for 2 weeks -Trend ESR and CRP -In 2 weeks (on or around 4/20) will reevaluate by ID to determine need for ongoing antibiotics at that time   Essential hypertension Blood pressure normal -Hold amlodipine, hydralazine -Continue Avapro   End stage renal disease on dialysis MWF(HCC) Secondary Hyperparathyroidism Hyperkalemia -Consult nephrology for maintenance dialysis, appreciate recommendations -Continue sevelamer, lokelma, Rena-Vite  Atrial flutter (HCC) -Continue apixaban   Anemia with  chronic kidney disease No indication for EPO at this time.  Neuropathy -Continue gabapentin   Anemia of chronic renal failure Stable, no clinical bleeding         Disposition: Status is: Inpatient  Remains inpatient appropriate because:Unsafe d/c plan   Dispo: The patient is from: SNF              Anticipated d/c is to: SNF              Anticipated d/c date is: > 3 days              Patient currently is medically stable to d/c.              MDM: The below labs and imaging reports reviewed and summarized above.  Medication management as above.     DVT prophylaxis: N/A on Eliquis Code Status: FULL Family Communication:     Consultants:     Procedures:     Antimicrobials:      Culture data:              Subjective: No new respiratory distress, chest pain, abdominal pain, diarrhea.  Has had tenderness and pain in his legs today he says  Objective: Vitals:   03/31/20 1755 03/31/20 1926 04/01/20 0513 04/01/20 1247  BP: (!) 98/51 123/60 139/76 (!) 127/56  Pulse: 86 84 81 (!) 43  Resp: '20 18 19 16  '$ Temp:  98.2 F (36.8 C) 98.5 F (36.9 C) (!) 97.4 F (36.3 C)  TempSrc:  Oral Oral Axillary  SpO2:  99% 93% 100%  Weight:      Height:        Intake/Output Summary (Last 24  hours) at 04/01/2020 1535 Last data filed at 04/01/2020 1422 Gross per 24 hour  Intake 309.18 ml  Output 1560 ml  Net -1250.82 ml   Filed Weights   03/22/20 1609 03/23/20 0727 03/24/20 0920  Weight: 91 kg 91 kg 91 kg    Examination: General appearance: Adult male, lying in bed, no apparent distress HEENT: Anicteric, conjunctival pink, lids and lashes normal for age, lips dry, edentulous, oropharynx moist, no oral lesions hearing diminished Skin:  Cardiac: Regular rate and rhythm, no murmurs appreciated, JVP normal, no lower extremity edema Respiratory: Normal respiratory rate and rhythm, lungs clear without rales or wheezes abdomen: Abdomen soft without  tenderness or guarding MSK: No pain to palpation of the vertebral spine, no abnormalities Neuro: Awake and alert, extraocular movements intact, generally weak, symmetrical    Psych: Affect blunted, judgment insight appear severely impaired   Data Reviewed: I have personally reviewed following labs and imaging studies:  CBC: Recent Labs  Lab 03/27/20 0528 03/28/20 0533 03/29/20 0614 03/31/20 0551 03/31/20 1430  WBC 4.5 5.7 5.5 4.9 4.3  HGB 10.8* 10.6* 10.3* 9.8* 9.3*  HCT 33.6* 32.8* 32.7* 31.4* 29.4*  MCV 98.2 95.9 98.5 99.7 97.7  PLT 171 179 165 171 935   Basic Metabolic Panel: Recent Labs  Lab 03/26/20 0441 03/26/20 0441 03/27/20 0528 03/28/20 0533 03/29/20 0614 03/31/20 0551 03/31/20 0900  NA 138   < > 139 141 141 139 137  K 6.0*   < > 5.2* 5.0 4.2 4.4 3.9  CL 95*   < > 97* 95* 100 96* 97*  CO2 29   < > '31 28 29 30 28  '$ GLUCOSE 82   < > 72 90 98 104* 153*  BUN 62*   < > 36* 50* 29* 45* 45*  CREATININE 9.97*   < > 7.30* 9.31* 6.97* 7.89* 8.56*  CALCIUM 9.0   < > 9.3 9.8 9.7 10.1 9.4  MG 2.2  --  2.1 2.2 2.0  --   --   PHOS  --   --  5.8* 7.3* 5.7* 6.6* 6.1*   < > = values in this interval not displayed.   GFR: Estimated Creatinine Clearance: 8.7 mL/min (A) (by C-G formula based on SCr of 8.56 mg/dL (H)). Liver Function Tests: Recent Labs  Lab 03/31/20 0551 03/31/20 0900  ALBUMIN 2.8* 2.5*   No results for input(s): LIPASE, AMYLASE in the last 168 hours. No results for input(s): AMMONIA in the last 168 hours. Coagulation Profile: No results for input(s): INR, PROTIME in the last 168 hours. Cardiac Enzymes: No results for input(s): CKTOTAL, CKMB, CKMBINDEX, TROPONINI in the last 168 hours. BNP (last 3 results) No results for input(s): PROBNP in the last 8760 hours. HbA1C: No results for input(s): HGBA1C in the last 72 hours. CBG: No results for input(s): GLUCAP in the last 168 hours. Lipid Profile: No results for input(s): CHOL, HDL, LDLCALC, TRIG,  CHOLHDL, LDLDIRECT in the last 72 hours. Thyroid Function Tests: No results for input(s): TSH, T4TOTAL, FREET4, T3FREE, THYROIDAB in the last 72 hours. Anemia Panel: No results for input(s): VITAMINB12, FOLATE, FERRITIN, TIBC, IRON, RETICCTPCT in the last 72 hours. Urine analysis: No results found for: COLORURINE, APPEARANCEUR, LABSPEC, PHURINE, GLUCOSEU, HGBUR, BILIRUBINUR, KETONESUR, PROTEINUR, UROBILINOGEN, NITRITE, LEUKOCYTESUR Sepsis Labs: '@LABRCNTIP'$ (procalcitonin:4,lacticacidven:4)  ) Recent Results (from the past 240 hour(s))  SARS CORONAVIRUS 2 (TAT 6-24 HRS) Nasopharyngeal Nasopharyngeal Swab     Status: None   Collection Time: 03/23/20  8:03 PM   Specimen:  Nasopharyngeal Swab  Result Value Ref Range Status   SARS Coronavirus 2 NEGATIVE NEGATIVE Final    Comment: (NOTE) SARS-CoV-2 target nucleic acids are NOT DETECTED. The SARS-CoV-2 RNA is generally detectable in upper and lower respiratory specimens during the acute phase of infection. Negative results do not preclude SARS-CoV-2 infection, do not rule out co-infections with other pathogens, and should not be used as the sole basis for treatment or other patient management decisions. Negative results must be combined with clinical observations, patient history, and epidemiological information. The expected result is Negative. Fact Sheet for Patients: SugarRoll.be Fact Sheet for Healthcare Providers: https://www.woods-mathews.com/ This test is not yet approved or cleared by the [place] FDA and  has been authorized for detection and/or diagnosis of SARS-CoV-2 by FDA under an Emergency Use Authorization (EUA). This EUA will remain  in effect (meaning this test can be used) for the duration of the COVID-19 declaration under Section 56 4(b)(1) of the Act, 21 U.S.C. section 360bbb-3(b)(1), unless the authorization is terminated or revoked sooner. Performed at [facility], [place] [address] [place], [place] [address]          Radiology Studies: No results found.      Scheduled Meds: . apixaban  5 mg Oral BID  . Chlorhexidine Gluconate Cloth  6 each Topical Q0600  . [START ON 04/03/2020] epoetin (EPOGEN/PROCRIT) injection  10,000 Units Intravenous Q M,W,F-HD  . feeding supplement (NEPRO CARB STEADY)  237 mL Oral BID BM  . gabapentin  300 mg Oral QHS  . lidocaine  2 patch Transdermal BID  . multivitamin  1 tablet Oral QHS  . ramipril  10 mg Oral Daily  . sevelamer carbonate  2,400 mg Oral TID WC  . sodium zirconium cyclosilicate  10 g Oral Daily   Continuous Infusions: . sodium chloride Stopped (03/31/20 1958)  . cefTAZidime (FORTAZ)  IV Stopped (03/31/20 2030)  . [START ON 04/03/2020] vancomycin Stopped (03/31/20 1811)     LOS: 9 days    Time spent: 25 minutes    Edwin Dada, MD [facility] 04/01/2020, 3:35 PM     Please page though [place] or Epic secure chat:  For [employer], Adult nurse

## 2020-04-02 NOTE — Progress Notes (Signed)
PROGRESS NOTE    Brian Fleming  KDX:[id number] DOB: 1946/05/21 DOA: 03/22/2020 PCP: [facility]      Brief Narrative:  Brian Fleming is a 74 y.o. M with HTN, A. fib on Eliquis, ESRD on HD MWF, chronic back pain, A. fib on Eliquis, ESRD on HD MWF, with a history of chronic back pain, who developed worsening of his chronic back pain in the last few months.  Seen in the ER 5 times since Feb, and finally imaging obtained by MRI that showed progressed endplate remodeling at Z7-6, unable to exclude discitis, osteomyelitis.        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  IR was consulted for this biopsy, but the patient was not able to tolerate and this was unsuccessful.  Infectious disease was consulted, and we have determined to attempt a trial of IV antibiotics. -Continue lidocaine patch as patient derives relief from this -Continue TLSO brace to be worn at all times when out of bed, per neurosurgery (however patient has refused evaluation) -Continue neurosurgery follow-up  -Continue vancomycin 1 g with dialysis and ceftazidime 2 g with dialysis for 2 weeks (until around 4/21) -Trend ESR and CRP -In 2 weeks (on or around 4/20) will reevaluate by ID to determine need for ongoing antibiotics at that time   Essential hypertension Blood pressure normal -Hold amlodipine, hydralazine -Continue Avapro   End stage renal disease on dialysis MWF(HCC) Secondary Hyperparathyroidism Hyperkalemia -Consult nephrology for maintenance dialysis, appreciate recommendations -Continue sevelamer, lokelma, Rena-Vite  Atrial flutter (HCC) -Continue apixaban    Anemia with chronic kidney disease No indication for EPO at this time.  Neuropathy -Continue gabapentin   Anemia of chronic renal failure Stable, no clinical bleeding         Disposition: Status is: Inpatient  Remains inpatient appropriate because:Unsafe d/c plan   Dispo: The patient is from: SNF              Anticipated d/c is to: SNF              Anticipated d/c date is: > 3 days              Patient currently is medically stable to d/c.              MDM: The below labs and imaging reports reviewed and summarized above.  Medication management as above.     DVT prophylaxis: N/A on Eliquis Code Status: FULL Family Communication:     Consultants:     Procedures:     Antimicrobials:      Culture data:              Subjective: Patient feels comfortable.  No abdominal pain, leg pain, chest pain, respiratory distress, fever.  Objective: Vitals:   04/01/20 2024 04/02/20 0423 04/02/20 1423 04/02/20 1445  BP: (!) 116/47 110/60 (!) 90/57   Pulse: (!) 51 60 (!) 126 61  Resp:  16 18   Temp:  98.4 F (36.9 C) 98 F (36.7 C)   TempSrc:  Oral Oral   SpO2:  96% 97%   Weight:      Height:        Intake/Output Summary (Last 24 hours) at 04/02/2020 1653 Last data  filed at 04/02/2020 1345 Gross per 24 hour  Intake 480 ml  Output --  Net 480 ml   Filed Weights   03/22/20 1609 03/23/20 0727 03/24/20 0920  Weight: 91 kg 91 kg 91 kg    Examination: General appearance: Elderly adult male, sleeping but arouses easily and in no acute distress.       Cardiac: RRR, no murmurs appreciated.  No LE edema.    Respiratory: Normal respiratory rate and rhythm.  CTAB without rales or wheezes. Abdomen: Abdomen soft.  No tenderness palpation or guarding. No ascites, distension, hepatosplenomegaly.   Psych: Sensorium intact and responding to questions, attention diminished. Affect blunted.  Judgment and insight appear impaired.     Data  Reviewed: I have personally reviewed following labs and imaging studies:  CBC: Recent Labs  Lab 03/27/20 0528 03/28/20 0533 03/29/20 0614 03/31/20 0551 03/31/20 1430  WBC 4.5 5.7 5.5 4.9 4.3  HGB 10.8* 10.6* 10.3* 9.8* 9.3*  HCT 33.6* 32.8* 32.7* 31.4* 29.4*  MCV 98.2 95.9 98.5 99.7 97.7  PLT 171 179 165 171 637   Basic Metabolic Panel: Recent Labs  Lab 03/27/20 0528 03/28/20 0533 03/29/20 0614 03/31/20 0551 03/31/20 0900  NA 139 141 141 139 137  K 5.2* 5.0 4.2 4.4 3.9  CL 97* 95* 100 96* 97*  CO2 '31 28 29 30 28  '$ GLUCOSE 72 90 98 104* 153*  BUN 36* 50* 29* 45* 45*  CREATININE 7.30* 9.31* 6.97* 7.89* 8.56*  CALCIUM 9.3 9.8 9.7 10.1 9.4  MG 2.1 2.2 2.0  --   --   PHOS 5.8* 7.3* 5.7* 6.6* 6.1*   GFR: Estimated Creatinine Clearance: 8.7 mL/min (A) (by C-G formula based on SCr of 8.56 mg/dL (H)). Liver Function Tests: Recent Labs  Lab 03/31/20 0551 03/31/20 0900  ALBUMIN 2.8* 2.5*   No results for input(s): LIPASE, AMYLASE in the last 168 hours. No results for input(s): AMMONIA in the last 168 hours. Coagulation Profile: No results for input(s): INR, PROTIME in the last 168 hours. Cardiac Enzymes: No results for input(s): CKTOTAL, CKMB, CKMBINDEX, TROPONINI in the last 168 hours. BNP (last 3 results) No results for input(s): PROBNP in the last 8760 hours. HbA1C: No results for input(s): HGBA1C in the last 72 hours. CBG: No results for input(s): GLUCAP in the last 168 hours. Lipid Profile: No results for input(s): CHOL, HDL, LDLCALC, TRIG, CHOLHDL, LDLDIRECT in the last 72 hours. Thyroid Function Tests: No results for input(s): TSH, T4TOTAL, FREET4, T3FREE, THYROIDAB in the last 72 hours. Anemia Panel: No results for input(s): VITAMINB12, FOLATE, FERRITIN, TIBC, IRON, RETICCTPCT in the last 72 hours. Urine analysis: No results found for: COLORURINE, APPEARANCEUR, LABSPEC, PHURINE, GLUCOSEU, HGBUR, BILIRUBINUR, KETONESUR, PROTEINUR, UROBILINOGEN, NITRITE,  LEUKOCYTESUR Sepsis Labs: '@LABRCNTIP'$ (procalcitonin:4,lacticacidven:4)  ) Recent Results (from the past 240 hour(s))  SARS CORONAVIRUS 2 (TAT 6-24 HRS) Nasopharyngeal Nasopharyngeal Swab     Status: None   Collection Time: 03/23/20  8:03 PM   Specimen: Nasopharyngeal Swab  Result Value Ref Range Status   SARS Coronavirus 2 NEGATIVE NEGATIVE Final    Comment: (NOTE) SARS-CoV-2 target nucleic acids are NOT DETECTED. The SARS-CoV-2 RNA is generally detectable in upper and lower respiratory specimens during the acute phase of infection. Negative results do not preclude SARS-CoV-2 infection, do not rule out co-infections with other pathogens, and should not be used as the sole basis for treatment or other patient management decisions. Negative results must be combined with clinical observations, patient history, and epidemiological information. The  expected result is Negative. Fact Sheet for Patients: SugarRoll.be Fact Sheet for Healthcare Providers: https://www.woods-mathews.com/ This test is not yet approved or cleared by the [place] FDA and  has been authorized for detection and/or diagnosis of SARS-CoV-2 by FDA under an Emergency Use Authorization (EUA). This EUA will remain  in effect (meaning this test can be used) for the duration of the COVID-19 declaration under Section 56 4(b)(1) of the Act, 21 U.S.C. section 360bbb-3(b)(1), unless the authorization is terminated or revoked sooner. Performed at [facility], [place] [address], [place], [place] [address]          Radiology Studies: No results found.      Scheduled Meds: . apixaban  5 mg Oral BID  . Chlorhexidine Gluconate Cloth  6 each Topical Q0600  . [START ON 04/03/2020] epoetin (EPOGEN/PROCRIT) injection  10,000 Units Intravenous Q M,W,F-HD  . feeding supplement (NEPRO CARB STEADY)  237 mL Oral BID BM  . gabapentin  300 mg Oral QHS  . lidocaine  2 patch  Transdermal BID  . multivitamin  1 tablet Oral QHS  . ramipril  10 mg Oral Daily  . sevelamer carbonate  2,400 mg Oral TID WC  . sodium zirconium cyclosilicate  10 g Oral Daily   Continuous Infusions: . sodium chloride Stopped (03/31/20 1958)  . cefTAZidime (FORTAZ)  IV Stopped (03/31/20 2030)  . [START ON 04/03/2020] vancomycin Stopped (03/31/20 1811)     LOS: 10 days    Time spent: 25 minutes    Edwin Dada, MD [facility] 04/02/2020, 4:53 PM     Please page though [place] or Epic secure chat:  For [employer], Adult nurse

## 2020-04-02 NOTE — NC FL2 (Signed)
[place] LEVEL OF CARE SCREENING TOOL     IDENTIFICATION  Patient Name: Brian Fleming Birthdate: 05-17-1946 Sex: male Admission Date (Current Location): 03/22/2020  [place] and [place] Number:  Selena Lesser [id number] [place] and Address:  [facility], [address] [place], [place] [address]      Provider Number: [id number]  Attending Physician Name and Address:  Edwin Dada, *  Relative Name and Phone Number:  Elaina Hoops, Legal Guardian [facility] [place], [phone]    Current Level of Care: Hospital Recommended Level of Care: [place] Prior Approval Number:    Date Approved/Denied:   PASRR Number: [id number]  Discharge Plan: SNF    Current Diagnoses: Patient Active Problem List   Diagnosis Date Noted  . Spinal stenosis 03/26/2020  . Discitis of lumbar region 03/26/2020  . Anemia 03/26/2020  . Back pain 03/23/2020  . End stage renal disease on dialysis ([place])   . Hyperkalemia   . HLD (hyperlipidemia)   . GERD (gastroesophageal reflux disease)   . Atrial flutter ([place])   . Intractable back pain   . Tobacco abuse   . Fall 07/11/2019  . HCAP (healthcare-associated pneumonia) 01/26/2019  . Pneumonia 01/25/2019  . End stage renal disease ([place]) 12/04/2017  . Essential hypertension 12/04/2017  . Hyperlipidemia 12/04/2017  . Generalized weakness 04/11/2016  . CHF (congestive heart failure) ([place]) 08/20/2015    Orientation RESPIRATION BLADDER Height & Weight     Self  Normal Incontinent Weight: 91 kg Height:  5\' 8"  (172.7 cm)  BEHAVIORAL SYMPTOMS/MOOD NEUROLOGICAL BOWEL NUTRITION STATUS      Incontinent Diet(Renal diet, thin liquids, fluid restriction 1270ml)  AMBULATORY STATUS COMMUNICATION OF NEEDS Skin   Limited Assist   Normal, Other (Comment)(MASD on abdomen)                       Personal Care Assistance Level of Assistance  Bathing, Feeding, Dressing Bathing  Assistance: Limited assistance Feeding assistance: Independent Dressing Assistance: Limited assistance     Functional Limitations Info  Sight, Hearing, Speech Sight Info: Adequate Hearing Info: Impaired Speech Info: Adequate    SPECIAL CARE FACTORS FREQUENCY  PT (By licensed PT), OT (By licensed OT)     PT Frequency: 5x/wk OT Frequency: 5x/wk            Contractures Contractures Info: Not present    Additional Factors Info  Code Status, Allergies Code Status Info: Full Code Allergies Info: No Known Allergies           Current Medications (04/02/2020):  This is the current hospital active medication list Current Facility-Administered Medications  Medication Dose Route Frequency Provider Last Rate Last Admin  . 0.9 %  sodium chloride infusion   Intravenous PRN Edwin Dada, MD   Stopped at 03/31/20 1958  . apixaban (ELIQUIS) tablet 5 mg  5 mg Oral BID Enzo Bi, MD   5 mg at 04/01/20 2130  . cefTAZidime (FORTAZ) 2 g in sodium chloride 0.9 % 100 mL IVPB  2 g Intravenous Q M,W,F-HD Lavonia Dana, MD   Stopped at 03/31/20 2030  . Chlorhexidine Gluconate Cloth 2 % PADS 6 each  6 each Topical Q0600 Lavonia Dana, MD   6 each at 04/02/20 0439  . [START ON 04/03/2020] epoetin alfa (EPOGEN) injection 10,000 Units  10,000 Units Intravenous Q M,W,F-HD Kolluru, Sarath, MD   10,000 Units at 03/31/20 1615  . feeding supplement (NEPRO CARB STEADY) liquid 237 mL  237 mL Oral BID BM Enzo Bi, MD   Stopped at 04/01/20 2211  . gabapentin (NEURONTIN) capsule 300 mg  300 mg Oral QHS Enzo Bi, MD   300 mg at 04/01/20 2130  . HYDROcodone-acetaminophen (NORCO/VICODIN) 5-325 MG per tablet 1 tablet  1 tablet Oral Q6H PRN Enzo Bi, MD   1 tablet at 04/01/20 0911  . labetalol (NORMODYNE) injection 10 mg  10 mg Intravenous Q2H PRN Enzo Bi, MD   10 mg at 03/23/20 0857  . lidocaine (LIDODERM) 5 % 2 patch  2 patch Transdermal BID Enzo Bi, MD   2 patch at 04/02/20 0439  . multivitamin  (RENA-VIT) tablet 1 tablet  1 tablet Oral QHS Enzo Bi, MD   1 tablet at 04/01/20 2130  . ondansetron (ZOFRAN) tablet 4 mg  4 mg Oral Q6H PRN Athena Masse, MD       Or  . ondansetron [facility]) injection 4 mg  4 mg Intravenous Q6H PRN Judd Gaudier V, MD      . ramipril (ALTACE) capsule 10 mg  10 mg Oral Daily Enzo Bi, MD   10 mg at 04/01/20 0910  . senna-docusate (Senokot-S) tablet 1 tablet  1 tablet Oral QHS PRN Athena Masse, MD   1 tablet at 04/01/20 1812  . sevelamer carbonate (RENVELA) tablet 2,400 mg  2,400 mg Oral TID WC Lateef, Munsoor, MD   2,400 mg at 04/01/20 1723  . sodium zirconium cyclosilicate (LOKELMA) packet 10 g  10 g Oral Daily Enzo Bi, MD   10 g at 04/01/20 0912  . [START ON 04/03/2020] vancomycin (VANCOCIN) IVPB 1000 mg/200 mL premix  1,000 mg Intravenous Q M,W,F-HD Berton [place], [facility]   Stopped at 03/31/20 1811     Discharge Medications: Please see discharge summary for a list of discharge medications.  Relevant Imaging Results:  Relevant Lab Results:   Additional Information SSN: [id number]; HD at [facility] MWF; Covid negative on 03/23/2020  Toney Lizaola B Nailyn Dearinger, LCSWA

## 2020-04-02 NOTE — Progress Notes (Signed)
[facility]  ROUNDING NOTE   Subjective:   Doing fair No acute c/o Eating lunch when seen.  Denies any shortness of breath No nausea or vomiting No leg edema  Objective:  Vital signs in last 24 hours:  Temp:  [98.4 F (36.9 C)-98.5 F (36.9 C)] 98.4 F (36.9 C) (04/11 0423) Pulse Rate:  [49-60] 60 (04/11 0423) Resp:  [16] 16 (04/11 0423) BP: (92-116)/(47-60) 110/60 (04/11 0423) SpO2:  [96 %] 96 % (04/11 0423)  Weight change:  Filed Weights   03/22/20 1609 03/23/20 0727 03/24/20 0920  Weight: 91 kg 91 kg 91 kg    Intake/Output: I/O last 3 completed shifts: In: 549.2 [P.O.:500; IV Piggyback:49.2] Out: -    Intake/Output this shift:  No intake/output data recorded.  Physical Exam: General: No acute distress, laying in bed  Head: Normocephalic, atraumatic. Moist oral mucosal membranes  Eyes: Anicteric  Neck: Supple,    Lungs:  Clear to auscultation, normal effort  Heart: regular  Abdomen:  Soft, nontender, bowel sounds present  Extremities: No peripheral edema.  Neurologic: Awake, alert, following commands  Skin: No lesions  Access: LUE AVF    Basic Metabolic Panel: Recent Labs  Lab 03/27/20 0528 03/27/20 0528 03/28/20 0533 03/28/20 0533 03/29/20 0614 03/31/20 0551 03/31/20 0900  NA 139  --  141  --  141 139 137  K 5.2*  --  5.0  --  4.2 4.4 3.9  CL 97*  --  95*  --  100 96* 97*  CO2 31  --  28  --  29 30 28   GLUCOSE 72  --  90  --  98 104* 153*  BUN 36*  --  50*  --  29* 45* 45*  CREATININE 7.30*  --  9.31*  --  6.97* 7.89* 8.56*  CALCIUM 9.3   < > 9.8   < > 9.7 10.1 9.4  MG 2.1  --  2.2  --  2.0  --   --   PHOS 5.8*  --  7.3*  --  5.7* 6.6* 6.1*   < > = values in this interval not displayed.    Liver Function Tests: Recent Labs  Lab 03/31/20 0551 03/31/20 0900  ALBUMIN 2.8* 2.5*   No results for input(s): LIPASE, AMYLASE in the last 168 hours. No results for input(s): AMMONIA in the last 168 hours.  CBC: Recent Labs  Lab  03/27/20 0528 03/28/20 0533 03/29/20 0614 03/31/20 0551 03/31/20 1430  WBC 4.5 5.7 5.5 4.9 4.3  HGB 10.8* 10.6* 10.3* 9.8* 9.3*  HCT 33.6* 32.8* 32.7* 31.4* 29.4*  MCV 98.2 95.9 98.5 99.7 97.7  PLT 171 179 165 171 157    Cardiac Enzymes: No results for input(s): CKTOTAL, CKMB, CKMBINDEX, TROPONINI in the last 168 hours.  BNP: Invalid input(s): POCBNP  CBG: No results for input(s): GLUCAP in the last 168 hours.  Microbiology: Results for orders placed or performed during the hospital encounter of 03/22/20  SARS CORONAVIRUS 2 (TAT 6-24 HRS) Nasopharyngeal Nasopharyngeal Swab     Status: None   Collection Time: 03/23/20  8:03 PM   Specimen: Nasopharyngeal Swab  Result Value Ref Range Status   SARS Coronavirus 2 NEGATIVE NEGATIVE Final    Comment: (NOTE) SARS-CoV-2 target nucleic acids are NOT DETECTED. The SARS-CoV-2 RNA is generally detectable in upper and lower respiratory specimens during the acute phase of infection. Negative results do not preclude SARS-CoV-2 infection, do not rule out co-infections with other pathogens, and should  not be used as the sole basis for treatment or other patient management decisions. Negative results must be combined with clinical observations, patient history, and epidemiological information. The expected result is Negative. Fact Sheet for Patients: SugarRoll.be Fact Sheet for Healthcare Providers: https://www.woods-mathews.com/ This test is not yet approved or cleared by the [place] FDA and  has been authorized for detection and/or diagnosis of SARS-CoV-2 by FDA under an Emergency Use Authorization (EUA). This EUA will remain  in effect (meaning this test can be used) for the duration of the COVID-19 declaration under Section 56 4(b)(1) of the Act, 21 U.S.C. section 360bbb-3(b)(1), unless the authorization is terminated or revoked sooner. Performed at [facility], [place] [address], [place], [place] [address]     Coagulation Studies: No results for input(s): LABPROT, INR in the last 72 hours.  Urinalysis: No results for input(s): COLORURINE, LABSPEC, PHURINE, GLUCOSEU, HGBUR, BILIRUBINUR, KETONESUR, PROTEINUR, UROBILINOGEN, NITRITE, LEUKOCYTESUR in the last 72 hours.  Invalid input(s): APPERANCEUR    Imaging: No results found.   Medications:   . sodium chloride Stopped (03/31/20 1958)  . cefTAZidime (FORTAZ)  IV Stopped (03/31/20 2030)  . [START ON 04/03/2020] vancomycin Stopped (03/31/20 1811)   . apixaban  5 mg Oral BID  . Chlorhexidine Gluconate Cloth  6 each Topical Q0600  . [START ON 04/03/2020] epoetin (EPOGEN/PROCRIT) injection  10,000 Units Intravenous Q M,W,F-HD  . feeding supplement (NEPRO CARB STEADY)  237 mL Oral BID BM  . gabapentin  300 mg Oral QHS  . lidocaine  2 patch Transdermal BID  . multivitamin  1 tablet Oral QHS  . ramipril  10 mg Oral Daily  . sevelamer carbonate  2,400 mg Oral TID WC  . sodium zirconium cyclosilicate  10 g Oral Daily   sodium chloride, HYDROcodone-acetaminophen, labetalol, ondansetron **OR** ondansetron (ZOFRAN) IV, senna-docusate  Assessment/ Plan:  [name] is a 74 y.o. black male with end-stage renal disease on hemodialysis, hypertension, hyperlipidemia, degenerative disc disease, admitted to [facility] on 03/22/2020 for Back pain [M54.9] Intractable back pain [M54.9] Acute midline low back pain without sciatica [M54.5]  [facility] Nephrology [address] MWF  #  ESRD on HD MWF: with hyperkalemia.  Next HD planned on Monday AM  #  Secondary Hyperparathyroidism:  Lab Results  Component Value Date   PTH 242 (H) 02/08/2020   CALCIUM 9.4 03/31/2020   PHOS 6.1 (H) 03/31/2020  - sevelamer with meals.   # Anemia with chronic kidney disease: Lab Results  Component Value Date   HGB 9.3 (L) 03/31/2020   EPO with HD treatment  # Disciitis: unable to get biopsy - empiric antibiotics: ceftazidime 2g  and vancomycin 1g for each hemodialysis treatment for six weeks. End date May 19.    LOS: [place] 4/11/20212:00 PM

## 2020-04-03 LAB — BASIC METABOLIC PANEL
Anion gap: 13 (ref 5–15)
BUN: 59 mg/dL — ABNORMAL HIGH (ref 8–23)
CO2: 29 mmol/L (ref 22–32)
Calcium: 10.1 mg/dL (ref 8.9–10.3)
Chloride: 94 mmol/L — ABNORMAL LOW (ref 98–111)
Creatinine, Ser: 10.31 mg/dL — ABNORMAL HIGH (ref 0.61–1.24)
GFR calc Af Amer: 5 mL/min — ABNORMAL LOW (ref >60)
GFR calc non Af Amer: 4 mL/min — ABNORMAL LOW (ref >60)
Glucose, Bld: 99 mg/dL (ref 70–99)
Potassium: 4.3 mmol/L (ref 3.5–5.1)
Sodium: 136 mmol/L (ref 135–145)

## 2020-04-03 LAB — CBC
HCT: 29.8 % — ABNORMAL LOW (ref 39.0–52.0)
Hemoglobin: 9.7 g/dL — ABNORMAL LOW (ref 13.0–17.0)
MCH: 31.2 pg (ref 26.0–34.0)
MCHC: 32.6 g/dL (ref 30.0–36.0)
MCV: 95.8 fL (ref 80.0–100.0)
Platelets: 193 10*3/uL (ref 150–400)
RBC: 3.11 MIL/uL — ABNORMAL LOW (ref 4.22–5.81)
RDW: 14.9 % (ref 11.5–15.5)
WBC: 5.9 10*3/uL (ref 4.0–10.5)
nRBC: 0 % (ref 0.0–0.2)

## 2020-04-03 LAB — C-REACTIVE PROTEIN: CRP: 10.5 mg/dL — ABNORMAL HIGH (ref <1.0)

## 2020-04-03 LAB — SEDIMENTATION RATE: Sed Rate: 86 mm/hr — ABNORMAL HIGH (ref 0–20)

## 2020-04-03 NOTE — Progress Notes (Signed)
PT Cancellation Note  Patient Details Name: Brian Fleming MRN: [id number] DOB: 07/19/1946   Cancelled Treatment:    Reason Eval/Treat Not Completed: Fatigue/lethargy limiting ability to participate(Pt just recently returned to floor from HD session. Will hold PT at this time, attempt again at later date/time.)  2:16 PM, 04/03/20 Etta Grandchild, PT, DPT Physical Therapist - [facility]  [phone] ([place])    Brian Fleming 04/03/2020, 2:15 PM

## 2020-04-03 NOTE — Progress Notes (Signed)
Pre HD Assessment    04/03/20 1221  Neurological  Level of Consciousness Alert  Orientation Level Oriented to person;Oriented to place;Oriented to situation;Disoriented to time  Respiratory  Respiratory Pattern Regular;Unlabored  Chest Assessment Chest expansion symmetrical  Bilateral Breath Sounds Clear;Diminished  Cardiac  Antiarrhythmic device No  Vascular  R Radial Pulse +2  L Radial Pulse +2  Musculoskeletal  Musculoskeletal (WDL) X  Generalized Weakness Yes  GU Assessment  Genitourinary (WDL) X  Genitourinary Symptoms Anuria  Psychosocial  Psychosocial (WDL) WDL

## 2020-04-03 NOTE — Care Management Important Message (Signed)
Important Message  Patient Details  Name: Brian Fleming MRN: [id number] Date of Birth: 1946-06-16   Medicare Important Message Given:  Yes  Copy of Medicare IM sent to [place].     Dannette Barbara 04/03/2020, 1:29 PM

## 2020-04-03 NOTE — Progress Notes (Signed)
Post HD     04/03/20 1615  Vital Signs  Temp 98.3 F (36.8 C)  Temp Source Oral  Pulse Rate 61  Pulse Rate Source Dinamap  Resp 16  BP 137/66  BP Location Right Arm  BP Method Automatic  Patient Position (if appropriate) Lying  Oxygen Therapy  SpO2 100 %  O2 Device Nasal Cannula  O2 Flow Rate (L/min) 2 L/min  Post-Hemodialysis Assessment  Rinseback Volume (mL) 250 mL  KECN 77.3 V  Dialyzer Clearance Lightly streaked  Duration of HD Treatment -hour(s) 3.5 hour(s)  Hemodialysis Intake (mL) 500 mL  UF Total -Machine (mL) 1771 mL  Net UF (mL) 1271 mL  Tolerated HD Treatment Yes  AVG/AVF Arterial Site Held (minutes)  (6 mins )  AVG/AVF Venous Site Held (minutes)  (6 min)

## 2020-04-03 NOTE — Progress Notes (Signed)
Pre HD     04/03/20 1220  Vital Signs  Temp 98 F (36.7 C)  Temp Source Oral  Pulse Rate (!) 47  Pulse Rate Source Monitor  Resp 12  BP 104/62  BP Location Right Arm  BP Method Automatic  Patient Position (if appropriate) Lying  Oxygen Therapy  SpO2 99 %  O2 Device Room Air  Pain Assessment  Pain Scale 0-10  Pain Score 0  Dialysis Weight  Weight 85.5 kg  Type of Weight Pre-Dialysis  Time-Out for Hemodialysis  What Procedure? HD   Pt Identifiers(min of two) First/Last Name;MRN/Account#;Pt's DOB(use if MRN/Acct# not available  Correct Site? Yes  Correct Side? Yes  Correct Procedure? Yes  Consents Verified? Yes  Rad Studies Available? N/A  Safety Precautions Reviewed? Yes  Engineer, civil (consulting) Number 1  Station Number [id number])  UF/Alarm Test Passed  Conductivity: Meter 14  Conductivity: Machine  14.1  pH 7  Reverse Osmosis 3  Normal Saline Lot Number [id number]  Dialyzer Lot Number [id number]  Disposable Set Lot Number [id number]  Machine Temperature 98.6 F (37 C)  Musician and Audible Yes  Blood Lines Intact and Secured Yes  Pre Treatment Patient Checks  Vascular access used during treatment Fistula  HD catheter dressing before treatment  (n/a)  Patient is receiving dialysis in a chair  (no)  Hepatitis B Surface Antigen Results Negative  Date Hepatitis B Surface Antigen Drawn 10/27/19  Isolation Initiated  (no)  Hepatitis B Surface Antibody  (>10)  Date Hepatitis B Surface Antibody Drawn 12/06/19  Hemodialysis Consent Verified Yes  Hemodialysis Standing Orders Initiated Yes  ECG (Telemetry) Monitor On Yes  Prime Ordered Normal Saline  Length of  DialysisTreatment -hour(s) 3.5 Hour(s)  Dialysis Treatment Comments  (Na 140)  Dialyzer Elisio 17H NR  Dialysate 3K;2.5 Ca  Dialysate Flow Ordered 600  Blood Flow Rate Ordered 400 mL/min  Ultrafiltration Goal 1.5 Liters  Dialysis Blood Pressure Support Ordered Normal Saline  Education / Care Plan   Dialysis Education Provided Yes  Documented Education in Care Plan Yes  Outpatient Plan of Care Reviewed and on Chart Yes

## 2020-04-03 NOTE — Progress Notes (Signed)
[facility]  ROUNDING NOTE   Subjective:   Doing fair No acute c/o Denies any shortness of breath No nausea or vomiting No leg edema  Objective:  Vital signs in last 24 hours:  Temp:  [98 F (36.7 C)-98.5 F (36.9 C)] 98.5 F (36.9 C) (04/12 0628) Pulse Rate:  [60-126] 60 (04/12 0628) Resp:  [18-20] 20 (04/11 2116) BP: (90-150)/(55-63) 150/63 (04/12 0628) SpO2:  [94 %-100 %] 100 % (04/12 0628)  Weight change:  Filed Weights   03/22/20 1609 03/23/20 0727 03/24/20 0920  Weight: 91 kg 91 kg 91 kg    Intake/Output: I/O last 3 completed shifts: In: 240 [P.O.:240] Out: -    Intake/Output this shift:  No intake/output data recorded.  Physical Exam: General: No acute distress, laying in bed  Head: Normocephalic, atraumatic. Moist oral mucosal membranes  Eyes: Anicteric  Neck: Supple,    Lungs:  Clear to auscultation, normal effort, room air  Heart: regular  Abdomen:  Soft, nontender, bowel sounds present  Extremities: No peripheral edema.  Neurologic: Awake, alert, following commands  Skin: No lesions  Access: LUE AVF    Basic Metabolic Panel: Recent Labs  Lab 03/28/20 0533 03/28/20 0533 03/29/20 4098 03/29/20 0614 03/31/20 0551 03/31/20 0900 04/03/20 0451  NA 141  --  141  --  139 137 136  K 5.0  --  4.2  --  4.4 3.9 4.3  CL 95*  --  100  --  96* 97* 94*  CO2 28  --  29  --  30 28 29   GLUCOSE 90  --  98  --  104* 153* 99  BUN 50*  --  29*  --  45* 45* 59*  CREATININE 9.31*  --  6.97*  --  7.89* 8.56* 10.31*  CALCIUM 9.8   < > 9.7   < > 10.1 9.4 10.1  MG 2.2  --  2.0  --   --   --   --   PHOS 7.3*  --  5.7*  --  6.6* 6.1*  --    < > = values in this interval not displayed.    Liver Function Tests: Recent Labs  Lab 03/31/20 0551 03/31/20 0900  ALBUMIN 2.8* 2.5*   No results for input(s): LIPASE, AMYLASE in the last 168 hours. No results for input(s): AMMONIA in the last 168 hours.  CBC: Recent Labs  Lab 03/28/20 0533  03/29/20 0614 03/31/20 0551 03/31/20 1430 04/03/20 0451  WBC 5.7 5.5 4.9 4.3 5.9  HGB 10.6* 10.3* 9.8* 9.3* 9.7*  HCT 32.8* 32.7* 31.4* 29.4* 29.8*  MCV 95.9 98.5 99.7 97.7 95.8  PLT 179 165 171 157 193    Cardiac Enzymes: No results for input(s): CKTOTAL, CKMB, CKMBINDEX, TROPONINI in the last 168 hours.  BNP: Invalid input(s): POCBNP  CBG: No results for input(s): GLUCAP in the last 168 hours.  Microbiology: Results for orders placed or performed during the hospital encounter of 03/22/20  SARS CORONAVIRUS 2 (TAT 6-24 HRS) Nasopharyngeal Nasopharyngeal Swab     Status: None   Collection Time: 03/23/20  8:03 PM   Specimen: Nasopharyngeal Swab  Result Value Ref Range Status   SARS Coronavirus 2 NEGATIVE NEGATIVE Final    Comment: (NOTE) SARS-CoV-2 target nucleic acids are NOT DETECTED. The SARS-CoV-2 RNA is generally detectable in upper and lower respiratory specimens during the acute phase of infection. Negative results do not preclude SARS-CoV-2 infection, do not rule out co-infections with other pathogens, and should  not be used as the sole basis for treatment or other patient management decisions. Negative results must be combined with clinical observations, patient history, and epidemiological information. The expected result is Negative. Fact Sheet for Patients: SugarRoll.be Fact Sheet for Healthcare Providers: https://www.woods-mathews.com/ This test is not yet approved or cleared by the [place] FDA and  has been authorized for detection and/or diagnosis of SARS-CoV-2 by FDA under an Emergency Use Authorization (EUA). This EUA will remain  in effect (meaning this test can be used) for the duration of the COVID-19 declaration under Section 56 4(b)(1) of the Act, 21 U.S.C. section 360bbb-3(b)(1), unless the authorization is terminated or revoked sooner. Performed at [facility], [place] [address] [place],  [place] [address]     Coagulation Studies: No results for input(s): LABPROT, INR in the last 72 hours.  Urinalysis: No results for input(s): COLORURINE, LABSPEC, PHURINE, GLUCOSEU, HGBUR, BILIRUBINUR, KETONESUR, PROTEINUR, UROBILINOGEN, NITRITE, LEUKOCYTESUR in the last 72 hours.  Invalid input(s): APPERANCEUR    Imaging: No results found.   Medications:   . sodium chloride Stopped (03/31/20 1958)  . cefTAZidime (FORTAZ)  IV Stopped (03/31/20 2030)  . vancomycin Stopped (03/31/20 1811)   . apixaban  5 mg Oral BID  . Chlorhexidine Gluconate Cloth  6 each Topical Q0600  . epoetin (EPOGEN/PROCRIT) injection  10,000 Units Intravenous Q M,W,F-HD  . feeding supplement (NEPRO CARB STEADY)  237 mL Oral BID BM  . gabapentin  300 mg Oral QHS  . lidocaine  2 patch Transdermal BID  . multivitamin  1 tablet Oral QHS  . ramipril  10 mg Oral Daily  . sevelamer carbonate  2,400 mg Oral TID WC  . sodium zirconium cyclosilicate  10 g Oral Daily   sodium chloride, HYDROcodone-acetaminophen, labetalol, ondansetron **OR** ondansetron (ZOFRAN) IV, senna-docusate  Assessment/ Plan:  Brian Fleming is a 74 y.o. black male with end-stage renal disease on hemodialysis, hypertension, hyperlipidemia, degenerative disc disease, admitted to [facility] on 03/22/2020 for Back pain [M54.9] Intractable back pain [M54.9] Acute midline low back pain without sciatica [M54.5]  [facility] Nephrology [address] MWF  #  ESRD on HD MWF: with hyperkalemia.  Next HD planned on Monday    #  Secondary Hyperparathyroidism:  Lab Results  Component Value Date   PTH 242 (H) 02/08/2020   CALCIUM 10.1 04/03/2020   PHOS 6.1 (H) 03/31/2020  - sevelamer with meals.   # Anemia with chronic kidney disease: Lab Results  Component Value Date   HGB 9.7 (L) 04/03/2020   EPO with HD treatment  # Disciitis: unable to get biopsy - empiric antibiotics: ceftazidime 2g and vancomycin 1g for each hemodialysis treatment   Per ID  recs from 4/7, check CRP in 2 weeks If no improvement extend Abx treatment to 6 weeks    Component Value Date/Time   CRP 16.4 (H) 03/29/2020 1226   CRP 19.1 (H) 03/22/2020 1752      LOS: 11 Oliviarose Punch 4/12/202110:32 AM

## 2020-04-03 NOTE — Progress Notes (Signed)
HD Initiated    04/03/20 1230  Vital Signs  Temp 98 F (36.7 C)  Temp Source Oral  Pulse Rate (!) 47  Pulse Rate Source Monitor  Resp 18  BP 112/61  BP Location Right Arm  BP Method Automatic  Patient Position (if appropriate) Lying  Oxygen Therapy  SpO2 91 %  O2 Device Room Air  Pain Assessment  Pain Scale 0-10  Pain Score 0  During Hemodialysis Assessment  Blood Flow Rate (mL/min) 400 mL/min  Arterial Pressure (mmHg) -170 mmHg  Venous Pressure (mmHg) 140 mmHg  Transmembrane Pressure (mmHg) 50 mmHg  Ultrafiltration Rate (mL/min) 430 mL/min  Dialysate Flow Rate (mL/min) 600 ml/min  Conductivity: Machine  14.1  HD Safety Checks Performed Yes  Dialysis Fluid Bolus Normal Saline  Bolus Amount (mL) 250 mL  Intra-Hemodialysis Comments Tx initiated

## 2020-04-03 NOTE — Progress Notes (Deleted)
Patient: Brian Fleming  Service Category: E/M  Provider: Gillis Santa, MD  DOB: [birth year]  DOS: 04/04/2020  Referring Provider: [facility]  MRN: [id number]  Setting: Ambulatory outpatient  PCP: [facility]  Type: New Patient  Specialty: Interventional Pain Management    Location: Office  Delivery: Face-to-face     Primary Reason(s) for Visit: Encounter for initial evaluation of one or more chronic problems (new to examiner) potentially causing chronic pain, and posing a threat to normal musculoskeletal function. (Level of risk: High) CC: No chief complaint on file.  HPI  Brian Fleming is a 74 y.o. year old, male patient, who comes today to see [place] for the first time for an initial evaluation of his chronic pain. He has CHF (congestive heart failure) ([place]); Generalized weakness; End stage renal disease ([place]); Essential hypertension; Hyperlipidemia; Pneumonia; HCAP (healthcare-associated pneumonia); Fall; End stage renal disease on dialysis [facility]); Hyperkalemia; HLD (hyperlipidemia); GERD (gastroesophageal reflux disease); Atrial flutter ([place]); Intractable back pain; Tobacco abuse; Back pain; Spinal stenosis; Discitis of lumbar region; and Anemia on their problem list. Today he comes in for evaluation of his No chief complaint on file.  Pain Assessment: Location:     Radiating:   Onset:   Duration:   Quality:   Severity:  /10 (subjective, self-reported pain score)  Note: Reported level is compatible with observation.                         When using our objective Pain Scale, levels between 6 and 10/10 are said to belong in an emergency room, as it progressively worsens from a 6/10, described as severely limiting, requiring emergency care not usually available at an outpatient pain management facility. At a 6/10 level, communication becomes difficult and requires great effort. Assistance to reach the emergency department may be required. Facial flushing and profuse  sweating along with potentially dangerous increases in heart rate and blood pressure will be evident. Effect on ADL:   Timing:   Modifying factors:   BP:    HR:    Onset and Duration: {Hx; Onset and Duration:210120511} Cause of pain: {Hx; Cause:210120521} Severity: {Pain Severity:210120502} Timing: {Symptoms; Timing:210120501} Aggravating Factors: {Causes; Aggravating pain factors:210120507} Alleviating Factors: {Causes; Alleviating Factors:210120500} Associated Problems: {Hx; Associated problems:210120515} Quality of Pain: {Hx; Symptom quality or Descriptor:210120531} Previous Examinations or Tests: {Hx; Previous examinations or test:210120529} Previous Treatments: {Hx; Previous Treatment:210120503}  The patient comes into the clinics today for the first time for a chronic pain management evaluation. ***  Today I took the time to provide the patient with information regarding my pain practice. The patient was informed that my practice is divided into two sections: an interventional pain management section, as well as a completely separate and distinct medication management section. I explained that I have procedure days for my interventional therapies, and evaluation days for follow-ups and medication management. Because of the amount of documentation required during both, they are kept separated. This means that there is the possibility that he may be scheduled for a procedure on one day, and medication management the next. I have also informed him that because of staffing and facility limitations, I no longer take patients for medication management only. To illustrate the reasons for this, I gave the patient the example of surgeons, and how inappropriate it would be to refer a patient to his/her care, just to write for the post-surgical antibiotics on a surgery done by a different surgeon.   Because interventional  pain management is my board-certified specialty, the patient was informed that  joining my practice means that they are open to any and all interventional therapies. I made it clear that this does not mean that they will be forced to have any procedures done. What this means is that I believe interventional therapies to be essential part of the diagnosis and proper management of chronic pain conditions. Therefore, patients not interested in these interventional alternatives will be better served under the care of a different practitioner.  The patient was also made aware of my Comprehensive Pain Management Safety Guidelines where by joining my practice, they limit all of their nerve blocks and joint injections to those done by our practice, for as long as we are retained to manage their care.   Historic Controlled Substance Pharmacotherapy Review  PMP and historical list of controlled substances: ***  Highest opioid analgesic regimen found: ***  Most recent opioid analgesic: ***  Current opioid analgesics:  ***  Highest recorded MME/day: *** mg/day MME/day: *** mg/day  Medications: The patient did not bring the medication(s) to the appointment, as requested in our "New Patient Package" Pharmacodynamics: Desired effects: Analgesia: The patient reports >50% benefit. Reported improvement in function: The patient reports medication allows him to accomplish basic ADLs. Clinically meaningful improvement in function (CMIF): Sustained CMIF goals met Perceived effectiveness: Described as relatively effective, allowing for increase in activities of daily living (ADL) Undesirable effects: Side-effects or Adverse reactions: None reported Historical Monitoring: The patient  reports no history of drug use. List of all UDS Test(s): No results found for: MDMA, COCAINSCRNUR, [place], [place], CANNABQUANT, [place], [place] List of other Serum/Urine Drug Screening Test(s):  No results found for: AMPHSCRSER, BARBSCRSER, BENZOSCRSER, COCAINSCRSER, COCAINSCRNUR, PCPSCRSER, PCPQUANT, THCSCRSER,  THCU, CANNABQUANT, OPIATESCRSER, OXYSCRSER, PROPOXSCRSER, ETH Historical Background Evaluation: [place] PMP: PDMP not reviewed this encounter. Six (6) year initial data search conducted.             PMP NARX Score Report:  Narcotic: *** Sedative: *** Stimulant: *** [place] Department of public safety, offender search: Editor, commissioning Information) Non-contributory Risk Assessment Profile: Aberrant behavior: None observed or detected today Risk factors for fatal opioid overdose: None identified today PMP NARX Overdose Risk Score: *** Fatal overdose hazard ratio (HR): Calculation deferred Non-fatal overdose hazard ratio (HR): Calculation deferred Risk of opioid abuse or dependence: 0.7-3.0% with doses ? 36 MME/day and 6.1-26% with doses ? 120 MME/day. Substance use disorder (SUD) risk level: See below Personal History of Substance Abuse (SUD-Substance use disorder):  Alcohol:    Illegal Drugs:    Rx Drugs:    ORT Risk Level calculation:    ORT Scoring interpretation table:  Score <3 = Low Risk for SUD  Score between 4-7 = Moderate Risk for SUD  Score >8 = High Risk for Opioid Abuse   PHQ-2 Depression Scale:  Total score:    PHQ-2 Scoring interpretation table: (Score and probability of major depressive disorder)  Score 0 = No depression  Score 1 = 15.4% Probability  Score 2 = 21.1% Probability  Score 3 = 38.4% Probability  Score 4 = 45.5% Probability  Score 5 = 56.4% Probability  Score 6 = 78.6% Probability   PHQ-9 Depression Scale:  Total score:    PHQ-9 Scoring interpretation table:  Score 0-4 = No depression  Score 5-9 = Mild depression  Score 10-14 = Moderate depression  Score 15-19 = Moderately severe depression  Score 20-27 = Severe depression (2.4 times higher risk of SUD and 2.89  times higher risk of overuse)   Pharmacologic Plan: As per protocol, I have not taken over any controlled substance management, pending the results of ordered tests and/or consults.            Initial  impression: Pending review of available data and ordered tests.  Meds  No current facility-administered medications for this visit. No current outpatient medications on file.  Facility-Administered Medications Ordered in Other Visits:  .  0.9 %  sodium chloride infusion, , Intravenous, PRN, Danford, Suann Larry, MD, Stopped at 03/31/20 1958 .  apixaban (ELIQUIS) tablet 5 mg, 5 mg, Oral, BID, Enzo Bi, MD, 5 mg at 04/03/20 0944 .  cefTAZidime (FORTAZ) 2 g in sodium chloride 0.9 % 100 mL IVPB, 2 g, Intravenous, Q M,W,F-HD, Kolluru, Sarath, MD, Stopped at 03/31/20 2030 .  Chlorhexidine Gluconate Cloth 2 % PADS 6 each, 6 each, Topical, Q0600, Lavonia Dana, MD, 6 each at 04/03/20 0509 .  epoetin alfa (EPOGEN) injection 10,000 Units, 10,000 Units, Intravenous, Q M,W,F-HD, Kolluru, Sarath, MD, 10,000 Units at 03/31/20 1615 .  feeding supplement (NEPRO CARB STEADY) liquid 237 mL, 237 mL, Oral, BID BM, Enzo Bi, MD, Last Rate: 0 mL/hr at 04/03/20 0015, 237 mL at 04/03/20 1136 .  gabapentin (NEURONTIN) capsule 300 mg, 300 mg, Oral, QHS, Enzo Bi, MD, 300 mg at 04/02/20 2134 .  HYDROcodone-acetaminophen (NORCO/VICODIN) 5-325 MG per tablet 1 tablet, 1 tablet, Oral, Q6H PRN, Enzo Bi, MD, 1 tablet at 04/01/20 0911 .  labetalol (NORMODYNE) injection 10 mg, 10 mg, Intravenous, Q2H PRN, Enzo Bi, MD, 10 mg at 03/23/20 0857 .  lidocaine (LIDODERM) 5 % 2 patch, 2 patch, Transdermal, BID, Enzo Bi, MD, 2 patch at 04/03/20 0437 .  multivitamin (RENA-VIT) tablet 1 tablet, 1 tablet, Oral, QHS, Enzo Bi, MD, 1 tablet at 04/02/20 2134 .  ondansetron (ZOFRAN) tablet 4 mg, 4 mg, Oral, Q6H PRN **OR** ondansetron (ZOFRAN) injection 4 mg, 4 mg, Intravenous, Q6H PRN, Judd Gaudier V, MD .  ramipril (ALTACE) capsule 10 mg, 10 mg, Oral, Daily, Enzo Bi, MD, 10 mg at 04/02/20 1017 .  senna-docusate (Senokot-S) tablet 1 tablet, 1 tablet, Oral, QHS PRN, Athena Masse, MD, 1 tablet at 04/01/20 1812 .  sevelamer  carbonate (RENVELA) tablet 2,400 mg, 2,400 mg, Oral, TID WC, Virda Betters, [place], MD, 2,400 mg at 04/03/20 1137 .  sodium zirconium cyclosilicate (LOKELMA) packet 10 g, 10 g, Oral, Daily, Enzo Bi, MD, 10 g at 04/03/20 0944 .  vancomycin (VANCOCIN) IVPB 1000 mg/200 mL premix, 1,000 mg, Intravenous, Q M,W,F-HD, Berton Mount, RPH, Stopped at 03/31/20 1811  Imaging Review  Cervical Imaging: Cervical MR wo contrast: No results found for this or any previous visit. Cervical MR wo contrast: No procedure found. Cervical MR w/wo contrast: No results found for this or any previous visit. Cervical MR w contrast: No results found for this or any previous visit. Cervical CT wo contrast:  Results for orders placed during the hospital encounter of 07/11/19  CT Cervical Spine Wo Contrast   Narrative CLINICAL DATA:  74 year old male with fall out of bed.  EXAM: CT HEAD WITHOUT CONTRAST  CT CERVICAL SPINE WITHOUT CONTRAST  TECHNIQUE: Multidetector CT imaging of the head and cervical spine was performed following the standard protocol without intravenous contrast. Multiplanar CT image reconstructions of the cervical spine were also generated.  COMPARISON:  Head CT dated 02/22/2017  FINDINGS: CT HEAD FINDINGS  Brain: There is mild age-related atrophy and chronic microvascular ischemic changes. 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 There is no acute intracranial hemorrhage. No mass effect or midline shift. No extra-axial fluid collection.  Vascular: No hyperdense vessel or unexpected calcification.  Skull: Normal. Negative for fracture or focal lesion.  Sinuses/Orbits: Mild mucoperiosteal thickening of paranasal sinuses. No air-fluid levels.  Other: Scattered areas of scalp calcification. Small scalp hematoma over the vertex.  CT CERVICAL SPINE FINDINGS  Alignment: No acute subluxation. There is mild reversal of normal cervical lordosis  which may be positional or due to muscle spasm or secondary to degenerative changes.  Skull base and vertebrae: No acute fracture. Osteopenia.  Soft tissues and spinal canal: No prevertebral fluid or swelling. No visible canal hematoma.  Disc levels: Extensive multilevel degenerative changes with endplate irregularities and disc space narrowing. Degenerative changes of the odontoid and atlanto odontoid space. Multilevel facet arthropathy most prominent involving the C1-C2 lateral processes.  Upper chest: Negative.  Other: Atherosclerotic calcification of the aortic arch as well as bilateral carotid bulb calcified plaques.  IMPRESSION: 1. No acute intracranial hemorrhage. 2. Mild age-related atrophy and chronic microvascular ischemic changes. 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 3. No acute/traumatic cervical spine pathology. Extensive degenerative changes.   Electronically Signed   By: Anner Crete M.D.   On: 07/11/2019 21:02    Cervical CT w/wo contrast: No results found for this or any previous visit. Cervical CT w/wo contrast: No results found for this or any previous visit. Cervical CT w contrast: No results found for this or any previous visit. Cervical CT outside: No results found for this or any previous visit. Cervical DG 1 view: No results found for this or any previous visit. Cervical DG 2-3 views: No results found for this or any previous visit. Cervical DG F/E views: No results found for this or any previous visit. Cervical DG 2-3 clearing views: No results found for this or any previous visit. Cervical DG Bending/F/E views: No results found for this or any previous visit. Cervical DG complete: No results found for this or any previous visit. Cervical DG Myelogram views: No results found for this or any previous visit. Cervical DG Myelogram views: No results found for this or any previous visit. Cervical Discogram views: No  results found for this or any previous visit.  Shoulder Imaging: Shoulder-R MR w contrast: No results found for this or any previous visit. Shoulder-L MR w contrast: No results found for this or any previous visit. Shoulder-R MR w/wo contrast: No results found for this or any previous visit. Shoulder-L MR w/wo contrast: No results found for this or any previous visit. Shoulder-R MR wo contrast: No results found for this or any previous visit. Shoulder-L MR wo contrast: No results found for this or any previous visit. Shoulder-R CT w contrast: No results found for this or any previous visit. Shoulder-L CT w contrast: No results found for this or any previous visit. Shoulder-R CT w/wo contrast: No results found for this or any previous visit. Shoulder-L CT w/wo contrast: No results found for this or any previous visit. Shoulder-R CT wo contrast: No results found for this or any previous visit. Shoulder-L CT wo contrast: No results found for this or any previous visit. Shoulder-R DG Arthrogram: No results found for this or any previous visit. Shoulder-L DG Arthrogram: No results found for this or any previous visit. Shoulder-R DG 1 view: No results found for this or any previous visit. Shoulder-L DG 1 view: No results  found for this or any previous visit. Shoulder-R DG:  Results for orders placed during the hospital encounter of 07/11/19  DG Shoulder Right   Narrative CLINICAL DATA:  Fall with shoulder pain  EXAM: RIGHT SHOULDER - 2+ VIEW  COMPARISON:  None.  FINDINGS: There is widening of the acromioclavicular interval to 15 mm. There is irregularity at the inferior margin of the glenoid fossa. No fracture of the humerus.  IMPRESSION: Widening of the acromioclavicular interval to 15 mm. Possible fracture of the inferior margin of the glenoid fossa. CT of the shoulder may be helpful for further characterization.   Electronically Signed   By: Ulyses Jarred M.D.   On: 07/11/2019  19:54    Shoulder-L DG:  Results for orders placed during the hospital encounter of 02/10/20  DG Shoulder Left   Narrative CLINICAL DATA:  Left shoulder pain following a fall today.  EXAM: LEFT SHOULDER - 2+ VIEW  COMPARISON:  None.  FINDINGS: Post osteotomy changes involving the distal clavicle and proximal acromion. No fracture or dislocation seen. Mild inferior glenohumeral spur formation.  IMPRESSION: No fracture or dislocation. Mild degenerative changes.   Electronically Signed   By: Claudie Revering M.D.   On: 02/10/2020 16:46     Thoracic Imaging: Thoracic MR wo contrast:  Results for orders placed during the hospital encounter of 03/22/20  MR THORACIC SPINE WO CONTRAST   Narrative CLINICAL DATA:  Back pain  EXAM: MRI THORACIC AND LUMBAR SPINE WITHOUT CONTRAST  TECHNIQUE: Multiplanar and multiecho pulse sequences of the thoracic and lumbar spine were obtained without intravenous contrast.  COMPARISON:  Thoracolumbar spine MRI 07/11/2019  FINDINGS: MRI THORACIC SPINE FINDINGS  Alignment:  Physiologic.  Vertebrae: Endplate remodeling at D3-26 is unchanged compared to 07/11/2019. There is multifocal bone marrow heterogeneity, also unchanged. No acute abnormality.  Cord: Assessment of the spinal cord is degraded by motion artifact. There may be a small area of hyperintense T2-weighted signal within the spinal cord at T9-10.  Paraspinal and other soft tissues: Negative  Disc levels:  T9-10: Diffuse disc bulge with narrowing of the ventral thecal sac. No spinal canal stenosis.  Other disc levels are unremarkable.  MRI LUMBAR SPINE FINDINGS  Segmentation:  Normal  Alignment: Grade 1 retrolisthesis at L3-4 and grade 1 anterolisthesis at L4-5  Vertebrae: Heterogeneous bone marrow signal with type 2 Modic changes at L4-5. There is edema within the L2-3 disc space. No adjacent endplate edema. There is low T1-weighted signal throughout most of the  lumbar spine.  Conus medullaris and cauda equina: Conus extends to the L1-2 level. Conus and cauda equina appear normal.  Paraspinal and other soft tissues: Atrophy of the psoas musculature. Diffuse cystic change of both kidneys.  Disc levels:  T12-L1: Normal disc space and facet joints. There is no spinal canal stenosis. No neural foraminal stenosis.  L1-L2: Normal disc space and facet joints. There is no spinal canal stenosis. No neural foraminal stenosis.  L2-L3: Disc space edema with diffuse bulge and large osteophytes. Unchanged severe spinal canal stenosis. Unchanged severe bilateral neural foraminal stenosis.  L3-L4: Diffuse disc bulge, slightly worsened. Slightly worsened severe spinal canal stenosis. No neural foraminal stenosis.  L4-L5: Normal disc space and facet joints. Severe disc space narrowing with intermediate sized disc osteophyte complex and severe facet arthrosis. Unchanged severe spinal canal stenosis. Unchanged severe bilateral neural foraminal stenosis.  L5-S1: Mild disc bulge and moderate facet arthrosis. There is no spinal canal stenosis. Unchanged mild left neural foraminal stenosis.  Visualized  sacrum: Normal.  IMPRESSION: 1. Mild progression of endplate remodeling at B8-4 with slightly increased disc space edema. No edema within the endplates. 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 Correlation with CRP might be helpful. 2. Unchanged severe lumbar spinal canal stenosis and severe bilateral neural foraminal stenosis at L2-3 and L4-5 with marked mass effect on the cauda equina nerve roots at these levels. 3. 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   Electronically Signed   By: Ulyses Jarred M.D.   On: 03/23/2020 01:51    Thoracic MR wo contrast: No procedure  found. Thoracic MR w/wo contrast: No results found for this or any previous visit. Thoracic MR w contrast: No results found for this or any previous visit. Thoracic CT wo contrast: No results found for this or any previous visit. Thoracic CT w/wo contrast: No results found for this or any previous visit. Thoracic CT w/wo contrast: No results found for this or any previous visit. Thoracic CT w contrast: No results found for this or any previous visit. Thoracic DG 2-3 views:  Results for orders placed during the hospital encounter of 02/10/20  DG Thoracic Spine 2 View   Narrative CLINICAL DATA:  Upper back pain following a fall today.  EXAM: THORACIC SPINE 2 VIEWS  COMPARISON:  Chest radiographs obtained at the same time.  FINDINGS: Lower thoracic spine degenerative changes. No fractures or subluxations seen.  IMPRESSION: No fracture or subluxation.   Electronically Signed   By: Claudie Revering M.D.   On: 02/10/2020 16:45    Thoracic DG 4 views: No results found for this or any previous visit. Thoracic DG: No results found for this or any previous visit. Thoracic DG w/swimmers view: No results found for this or any previous visit. Thoracic DG Myelogram views: No results found for this or any previous visit. Thoracic DG Myelogram views: No results found for this or any previous visit.  Lumbosacral Imaging: Lumbar MR wo contrast:  Results for orders placed during the hospital encounter of 03/22/20  MR LUMBAR SPINE WO CONTRAST   Narrative CLINICAL DATA:  Back pain  EXAM: MRI THORACIC AND LUMBAR SPINE WITHOUT CONTRAST  TECHNIQUE: Multiplanar and multiecho pulse sequences of the thoracic and lumbar spine were obtained without intravenous contrast.  COMPARISON:  Thoracolumbar spine MRI 07/11/2019  FINDINGS: MRI THORACIC SPINE FINDINGS  Alignment:  Physiologic.  Vertebrae: Endplate remodeling at D3-57 is unchanged compared to 07/11/2019. There is multifocal bone marrow  heterogeneity, also unchanged. No acute abnormality.  Cord: Assessment of the spinal cord is degraded by motion artifact. There may be a small area of hyperintense T2-weighted signal within the spinal cord at T9-10.  Paraspinal and other soft tissues: Negative  Disc levels:  T9-10: Diffuse disc bulge with narrowing of the ventral thecal sac. No spinal canal stenosis.  Other disc levels are unremarkable.  MRI LUMBAR SPINE FINDINGS  Segmentation:  Normal  Alignment: Grade 1 retrolisthesis at L3-4 and grade 1 anterolisthesis at L4-5  Vertebrae: Heterogeneous bone marrow signal with type 2 Modic changes at L4-5. There is edema within the L2-3 disc space. No adjacent endplate edema. There is low T1-weighted signal throughout most of the lumbar spine.  Conus medullaris and cauda equina: Conus extends to the L1-2 level. Conus and cauda equina appear normal.  Paraspinal and other soft tissues: Atrophy of the psoas musculature. Diffuse cystic change of both kidneys.  Disc levels:  T12-L1: Normal  disc space and facet joints. There is no spinal canal stenosis. No neural foraminal stenosis.  L1-L2: Normal disc space and facet joints. There is no spinal canal stenosis. No neural foraminal stenosis.  L2-L3: Disc space edema with diffuse bulge and large osteophytes. Unchanged severe spinal canal stenosis. Unchanged severe bilateral neural foraminal stenosis.  L3-L4: Diffuse disc bulge, slightly worsened. Slightly worsened severe spinal canal stenosis. No neural foraminal stenosis.  L4-L5: Normal disc space and facet joints. Severe disc space narrowing with intermediate sized disc osteophyte complex and severe facet arthrosis. Unchanged severe spinal canal stenosis. Unchanged severe bilateral neural foraminal stenosis.  L5-S1: Mild disc bulge and moderate facet arthrosis. There is no spinal canal stenosis. Unchanged mild left neural foraminal stenosis.  Visualized sacrum:  Normal.  IMPRESSION: 1. Mild progression of endplate remodeling at I6-9 with slightly increased disc space edema. No edema within the endplates. 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 Correlation with CRP might be helpful. 2. Unchanged severe lumbar spinal canal stenosis and severe bilateral neural foraminal stenosis at L2-3 and L4-5 with marked mass effect on the cauda equina nerve roots at these levels. 3. 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   Electronically Signed   By: Ulyses Jarred M.D.   On: 03/23/2020 01:51    Lumbar MR wo contrast: No procedure found. Lumbar MR w/wo contrast: No results found for this or any previous visit. Lumbar MR w/wo contrast: No results found for this or any previous visit. Lumbar MR w contrast: No results found for this or any previous visit. Lumbar CT wo contrast:  Results for orders placed during the hospital encounter of 03/16/20  CT Lumbar Spine Wo Contrast   Narrative CLINICAL DATA:  Back pain. History of renal failure.  EXAM: CT LUMBAR SPINE WITHOUT CONTRAST  TECHNIQUE: Multidetector CT imaging of the lumbar spine was performed without intravenous contrast administration. Multiplanar CT image reconstructions were also generated.  COMPARISON:  CT abdomen/pelvis 03/07/2020 and MRI lumbar spine 07/11/2019  FINDINGS: Segmentation: There are five lumbar type vertebral bodies. The last full intervertebral disc space is labeled L5-S1.  Alignment: Normal overall alignment.  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 No worrisome bone lesions or acute fracture. I do not see any definite CT findings suspicious for discitis or osteomyelitis.  Paraspinal and other  soft tissues: Fairly prominent inflammatory type changes involving the psoas muscles bilaterally, particularly at C2-3. However, this is stable and unchanged since prior CT scan dating back to 07/11/2019. 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 I do not see any fluid collections to suggest an abscess.  Disc levels: Stable severe multilevel disc disease and facet disease due to dialysis related spondyloarthropathy. I do not see any evidence of an acute fracture, discitis or osteomyelitis.  IMPRESSION: 1. 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 Stable severe multilevel disc disease and facet disease. 2. No definite CT findings for discitis or osteomyelitis. 3. Stable inflammatory type changes involving the psoas muscles bilaterally, particularly at C2-3.   Electronically Signed   By: Marijo Sanes M.D.   On: 03/16/2020 18:27    Lumbar CT w/wo contrast: No results found for this or any previous visit. Lumbar CT w/wo contrast: No results found for this or any previous visit. Lumbar CT w contrast: No results found for this or any previous visit. Lumbar DG  1V: No results found for this or any previous visit. Lumbar DG 1V (Clearing): No results found for this or any previous visit. Lumbar DG 2-3V (Clearing): No results found for this or any previous visit. Lumbar DG 2-3 views:  Results for orders placed during the hospital encounter of 02/10/20  DG Lumbar Spine 2-3 Views   Narrative CLINICAL DATA:  Low back pain following a fall today.  EXAM: LUMBAR SPINE - 2-3 VIEW  COMPARISON:  02/07/2020  FINDINGS: Five non-rib-bearing lumbar vertebrae. Improved demarcation of the endplates at the D3-2 level. Stable multilevel degenerative changes, most pronounced at the L4-5 level with large, fused anterior and lateral spurs at that level. No fractures or subluxations. No pars defects  seen.  IMPRESSION: 1. No fracture or subluxation. 2. Stable multilevel degenerative changes.   Electronically Signed   By: Claudie Revering M.D.   On: 02/10/2020 16:44    Lumbar DG (Complete) 4+V: No results found for this or any previous visit.       Lumbar DG F/E views: No results found for this or any previous visit.       Lumbar DG Bending views: No results found for this or any previous visit.       Lumbar DG Myelogram views: No results found for this or any previous visit. Lumbar DG Myelogram: No results found for this or any previous visit. Lumbar DG Myelogram: No results found for this or any previous visit. Lumbar DG Myelogram: No results found for this or any previous visit. Lumbar DG Myelogram Lumbosacral: No results found for this or any previous visit. Lumbar DG Diskogram views: No results found for this or any previous visit. Lumbar DG Diskogram views: No results found for this or any previous visit. Lumbar DG Epidurogram OP: No results found for this or any previous visit. Lumbar DG Epidurogram IP: No results found for this or any previous visit.  Sacroiliac Joint Imaging: Sacroiliac Joint DG: No results found for this or any previous visit. Sacroiliac Joint MR w/wo contrast: No results found for this or any previous visit. Sacroiliac Joint MR wo contrast: No results found for this or any previous visit.  Spine Imaging: Whole Spine DG Myelogram views: No results found for this or any previous visit. Whole Spine MR Mets screen: No results found for this or any previous visit. Whole Spine MR Mets screen: No results found for this or any previous visit. Whole Spine MR w/wo: No results found for this or any previous visit. MRA Spinal Canal w/ cm: No results found for this or any previous visit. MRA Spinal Canal wo/ cm: No procedure found. MRA Spinal Canal w/wo cm: No results found for this or any previous visit. Spine Outside MR Films: No results found for this or any  previous visit. Spine Outside CT Films: No results found for this or any previous visit. CT-Guided Biopsy: No results found for this or any previous visit. CT-Guided Needle Placement: No results found for this or any previous visit. DG Spine outside: No results found for this or any previous visit. IR Spine outside: No results found for this or any previous visit. NM Spine outside: No results found for this or any previous visit.  Hip Imaging: Hip-R MR w contrast: No results found for this or any previous visit. Hip-L MR w contrast: No results found for this or any previous visit. Hip-R MR w/wo contrast: No results found for this or any previous visit. Hip-L MR w/wo contrast: No results found  for this or any previous visit. Hip-R MR wo contrast: No results found for this or any previous visit. Hip-L MR wo contrast: No results found for this or any previous visit. Hip-R CT w contrast: No results found for this or any previous visit. Hip-L CT w contrast: No results found for this or any previous visit. Hip-R CT w/wo contrast: No results found for this or any previous visit. Hip-L CT w/wo contrast: No results found for this or any previous visit. Hip-R CT wo contrast: No results found for this or any previous visit. Hip-L CT wo contrast: No results found for this or any previous visit. Hip-R DG 2-3 views: No results found for this or any previous visit. Hip-L DG 2-3 views: No results found for this or any previous visit. Hip-R DG Arthrogram: No results found for this or any previous visit. Hip-L DG Arthrogram: No results found for this or any previous visit. Hip-B DG Bilateral: No results found for this or any previous visit.  Knee Imaging: Knee-R MR w contrast: No results found for this or any previous visit. Knee-L MR w contrast: No results found for this or any previous visit. Knee-R MR w/wo contrast: No results found for this or any previous visit. Knee-L MR w/wo contrast: No results  found for this or any previous visit. Knee-R MR wo contrast: No results found for this or any previous visit. Knee-L MR wo contrast: No results found for this or any previous visit. Knee-R CT w contrast: No results found for this or any previous visit. Knee-L CT w contrast: No results found for this or any previous visit. Knee-R CT w/wo contrast: No results found for this or any previous visit. Knee-L CT w/wo contrast: No results found for this or any previous visit. Knee-R CT wo contrast: No results found for this or any previous visit. Knee-L CT wo contrast: No results found for this or any previous visit. Knee-R DG 1-2 views: No results found for this or any previous visit. Knee-L DG 1-2 views: No results found for this or any previous visit. Knee-R DG 3 views: No results found for this or any previous visit. Knee-L DG 3 views: No results found for this or any previous visit. Knee-R DG 4 views: No results found for this or any previous visit. Knee-L DG 4 views: No results found for this or any previous visit. Knee-R DG Arthrogram: No results found for this or any previous visit. Knee-L DG Arthrogram: No results found for this or any previous visit.  Ankle Imaging: Ankle-R DG Complete: No results found for this or any previous visit. Ankle-L DG Complete: No results found for this or any previous visit.  Foot Imaging: Foot-R DG Complete: No results found for this or any previous visit. Foot-L DG Complete: No results found for this or any previous visit.  Elbow Imaging: Elbow-R DG Complete: No results found for this or any previous visit. Elbow-L DG Complete: No results found for this or any previous visit.  Wrist Imaging: Wrist-R DG Complete: No results found for this or any previous visit. Wrist-L DG Complete: No results found for this or any previous visit.  Hand Imaging: Hand-R DG Complete: No results found for this or any previous visit. Hand-L DG Complete: No results found  for this or any previous visit.  Complexity Note: Imaging results reviewed. Results shared with Mr. Dollins, using Layman's terms.  ROS  Cardiovascular: {Hx; Cardiovascular History:210120525} Pulmonary or Respiratory: {Hx; Pumonary and/or Respiratory History:210120523} Neurological: {Hx; Neurological:210120504} Psychological-Psychiatric: {Hx; Psychological-Psychiatric History:210120512} Gastrointestinal: {Hx; Gastrointestinal:210120527} Genitourinary: {Hx; Genitourinary:210120506} Hematological: {Hx; Hematological:210120510} Endocrine: {Hx; Endocrine history:210120509} Rheumatologic: {Hx; Rheumatological:210120530} Musculoskeletal: {Hx; Musculoskeletal:210120528} Work History: {Hx; Work history:210120514}  Allergies  Mr. Wendell has No Known Allergies.  Laboratory Chemistry Profile   Renal Lab Results  Component Value Date   BUN 59 (H) 04/03/2020   CREATININE 10.31 (H) 04/03/2020   GFRAA 5 (L) 04/03/2020   GFRNONAA 4 (L) 04/03/2020     Electrolytes Lab Results  Component Value Date   NA 136 04/03/2020   K 4.3 04/03/2020   CL 94 (L) 04/03/2020   CALCIUM 10.1 04/03/2020   MG 2.0 03/29/2020   PHOS 6.1 (H) 03/31/2020     Hepatic Lab Results  Component Value Date   AST 12 (L) 03/22/2020   ALT 13 03/22/2020   ALBUMIN 2.5 (L) 03/31/2020   ALKPHOS 62 03/22/2020   LIPASE 36 03/07/2020     ID Lab Results  Component Value Date   HIV Non Reactive 01/25/2019   [place] NEGATIVE 03/23/2020   MRSAPCR NEGATIVE 07/12/2019     Bone No results found for: [place], [id number], PY1950DT2, IZ1245YK9, 25OHVITD1, 25OHVITD2, 25OHVITD3, TESTOFREE, TESTOSTERONE   Endocrine Lab Results  Component Value Date   GLUCOSE 99 04/03/2020   TSH 1.662 07/13/2019     Neuropathy Lab Results  Component Value Date   HIV Non Reactive 01/25/2019     CNS No results found for: COLORCSF, APPEARCSF, RBCCOUNTCSF, WBCCSF, POLYSCSF, LYMPHSCSF, EOSCSF, PROTEINCSF,  GLUCCSF, JCVIRUS, CSFOLI, IGGCSF, LABACHR, ACETBL, LABACHR, ACETBL   Inflammation (CRP: Acute  ESR: Chronic) Lab Results  Component Value Date   CRP 10.5 (H) 04/03/2020   ESRSEDRATE 86 (H) 04/03/2020   LATICACIDVEN 1.7 07/11/2019     Rheumatology Lab Results  Component Value Date   RF <10.0 03/25/2020     Coagulation Lab Results  Component Value Date   INR 1.2 07/12/2019   LABPROT 15.2 07/12/2019   PLT 193 04/03/2020     Cardiovascular Lab Results  Component Value Date   BNP 480.0 (H) 03/16/2020   CKTOTAL 661 (H) 07/11/2019   TROPONINI 0.04 (HH) 01/25/2019   HGB 9.7 (L) 04/03/2020   HCT 29.8 (L) 04/03/2020     Screening Lab Results  Component Value Date   SARSCOV2NAA NEGATIVE 03/23/2020   MRSAPCR NEGATIVE 07/12/2019   HIV Non Reactive 01/25/2019     Cancer No results found for: CEA, CA125, LABCA2   Allergens No results found for: ALMOND, APPLE, ASPARAGUS, AVOCADO, BANANA, BARLEY, BASIL, BAYLEAF, GREENBEAN, LIMABEAN, WHITEBEAN, BEEFIGE, REDBEET, BLUEBERRY, BROCCOLI, CABBAGE, MELON, CARROT, CASEIN, CASHEWNUT, CAULIFLOWER, CELERY     Note: Lab results reviewed.   [place]  Drug: Mr. Fillinger  reports no history of drug use. Alcohol:  reports no history of alcohol use. Tobacco:  reports that he has been smoking cigarettes. He has been smoking about 0.25 packs per day. He has never used smokeless tobacco. Medical:  has a past medical history of Atrial flutter ([place]), DJD (degenerative joint disease), End stage renal disease on dialysis ([place]), Hyperlipidemia, and Hypertension. Family: family history includes Hypertension in his brother.  Past Surgical History:  Procedure Laterality Date  . AV FISTULA PLACEMENT    . BACK SURGERY     Active Ambulatory Problems    Diagnosis Date Noted  . CHF (congestive heart failure) ([place]) 08/20/2015  . Generalized weakness 04/11/2016  . End stage renal disease ([place])  12/04/2017  . Essential hypertension 12/04/2017  . Hyperlipidemia  12/04/2017  . Pneumonia 01/25/2019  . HCAP (healthcare-associated pneumonia) 01/26/2019  . Fall 07/11/2019  . End stage renal disease on dialysis ([place])   . Hyperkalemia   . HLD (hyperlipidemia)   . GERD (gastroesophageal reflux disease)   . Atrial flutter ([place])   . Intractable back pain   . Tobacco abuse   . Back pain 03/23/2020  . Spinal stenosis 03/26/2020  . Discitis of lumbar region 03/26/2020  . Anemia 03/26/2020   Resolved Ambulatory Problems    Diagnosis Date Noted  . No Resolved Ambulatory Problems   Past Medical History:  Diagnosis Date  . DJD (degenerative joint disease)   . Hypertension    Constitutional Exam  General appearance: Well nourished, well developed, and well hydrated. In no apparent acute distress There were no vitals filed for this visit. BMI Assessment: Estimated body mass index is 28.64 kg/m as calculated from the following:   Height as of 03/23/20: '5\' 8"'$  (1.727 m).   Weight as of 04/03/20: 188 lb 6.1 oz (85.5 kg).  BMI interpretation table: BMI level Category Range association with higher incidence of chronic pain  <18 kg/m2 Underweight   18.5-24.9 kg/m2 Ideal body weight   25-29.9 kg/m2 Overweight Increased incidence by 20%  30-34.9 kg/m2 Obese (Class I) Increased incidence by 68%  35-39.9 kg/m2 Severe obesity (Class II) Increased incidence by 136%  >40 kg/m2 Extreme obesity (Class III) Increased incidence by 254%   Patient's current BMI Ideal Body weight  There is no height or weight on file to calculate BMI. Ideal body weight: 68.4 kg (150 lb 12.7 oz) Adjusted ideal body weight: 75.2 kg (165 lb 13.3 oz)   BMI Readings from Last 4 Encounters:  04/03/20 28.64 kg/m  03/16/20 30.50 kg/m  03/07/20 30.50 kg/m  02/10/20 30.50 kg/m   Wt Readings from Last 4 Encounters:  04/03/20 188 lb 6.1 oz (85.5 kg)  03/16/20 200 lb 9.9 oz (91 kg)  03/07/20 200 lb 9.9 oz (91 kg)  02/10/20 200 lb 9.9 oz (91 kg)    Psych/Mental status: Alert, oriented  x 3 (person, place, & time)       Eyes: PERLA Respiratory: No evidence of acute respiratory distress  Cervical Spine Exam  Skin & Axial Inspection: No masses, redness, edema, swelling, or associated skin lesions Alignment: Symmetrical Functional ROM: Unrestricted ROM      Stability: No instability detected Muscle Tone/Strength: Functionally intact. No obvious neuro-muscular anomalies detected. Sensory (Neurological): Unimpaired Palpation: No palpable anomalies              Upper Extremity (UE) Exam    Side: Right upper extremity  Side: Left upper extremity  Skin & Extremity Inspection: Skin color, temperature, and hair growth are WNL. No peripheral edema or cyanosis. No masses, redness, swelling, asymmetry, or associated skin lesions. No contractures.  Skin & Extremity Inspection: Skin color, temperature, and hair growth are WNL. No peripheral edema or cyanosis. No masses, redness, swelling, asymmetry, or associated skin lesions. No contractures.  Functional ROM: Unrestricted ROM          Functional ROM: Unrestricted ROM          Muscle Tone/Strength: Functionally intact. No obvious neuro-muscular anomalies detected.  Muscle Tone/Strength: Functionally intact. No obvious neuro-muscular anomalies detected.  Sensory (Neurological): Unimpaired          Sensory (Neurological): Unimpaired          Palpation: No palpable anomalies  Palpation: No palpable anomalies              Provocative Test(s):  Phalen's test: deferred Tinel's test: deferred Apley's scratch test (touch opposite shoulder):  Action 1 (Across chest): deferred Action 2 (Overhead): deferred Action 3 (LB reach): deferred   Provocative Test(s):  Phalen's test: deferred Tinel's test: deferred Apley's scratch test (touch opposite shoulder):  Action 1 (Across chest): deferred Action 2 (Overhead): deferred Action 3 (LB reach): deferred    Thoracic Spine Area Exam  Skin & Axial Inspection: No masses, redness, or  swelling Alignment: Symmetrical Functional ROM: Unrestricted ROM Stability: No instability detected Muscle Tone/Strength: Functionally intact. No obvious neuro-muscular anomalies detected. Sensory (Neurological): Unimpaired Muscle strength & Tone: No palpable anomalies  Lumbar Exam  Skin & Axial Inspection: No masses, redness, or swelling Alignment: Symmetrical Functional ROM: Unrestricted ROM       Stability: No instability detected Muscle Tone/Strength: Functionally intact. No obvious neuro-muscular anomalies detected. Sensory (Neurological): Unimpaired Palpation: No palpable anomalies       Provocative Tests: Hyperextension/rotation test: deferred today       Lumbar quadrant test (Kemp's test): deferred today       Lateral bending test: deferred today       Patrick's Maneuver: deferred today                   FABER* test: deferred today                   S-I anterior distraction/compression test: deferred today         S-I lateral compression test: deferred today         S-I Thigh-thrust test: deferred today         S-I Gaenslen's test: deferred today         *(Flexion, ABduction and External Rotation)  Gait & Posture Assessment  Ambulation: Unassisted Gait: Relatively normal for age and body habitus Posture: WNL   Lower Extremity Exam    Side: Right lower extremity  Side: Left lower extremity  Stability: No instability observed          Stability: No instability observed          Skin & Extremity Inspection: Skin color, temperature, and hair growth are WNL. No peripheral edema or cyanosis. No masses, redness, swelling, asymmetry, or associated skin lesions. No contractures.  Skin & Extremity Inspection: Skin color, temperature, and hair growth are WNL. No peripheral edema or cyanosis. No masses, redness, swelling, asymmetry, or associated skin lesions. No contractures.  Functional ROM: Unrestricted ROM                  Functional ROM: Unrestricted ROM                   Muscle Tone/Strength: Functionally intact. No obvious neuro-muscular anomalies detected.  Muscle Tone/Strength: Functionally intact. No obvious neuro-muscular anomalies detected.  Sensory (Neurological): Unimpaired        Sensory (Neurological): Unimpaired        DTR: Patellar: deferred today Achilles: deferred today Plantar: deferred today  DTR: Patellar: deferred today Achilles: deferred today Plantar: deferred today  Palpation: No palpable anomalies  Palpation: No palpable anomalies   Assessment  Primary Diagnosis & Pertinent Problem List: There were no encounter diagnoses.  Visit Diagnosis (New problems to examiner): No diagnosis found. Plan of Care (Initial workup plan)  Note: Mr. Heider was reminded that as per protocol, today's visit has been an evaluation only. We have  not taken over the patient's controlled substance management.  Problem-specific plan: No problem-specific Assessment & Plan notes found for this encounter.  Lab Orders  No laboratory test(s) ordered today   Imaging Orders  No imaging studies ordered today   Referral Orders  No referral(s) requested today   Procedure Orders    No procedure(s) ordered today   Pharmacotherapy (current): Medications ordered:  No orders of the defined types were placed in this encounter.  Medications administered during this visit: Zorion Nims had no medications administered during this visit.   Pharmacological management options:  Opioid Analgesics: The patient was informed that there is no guarantee that he would be a candidate for opioid analgesics. The decision will be made following CDC guidelines. This decision will be based on the results of diagnostic studies, as well as Mr. Werth risk profile.   Membrane stabilizer: To be determined at a later time  Muscle relaxant: To be determined at a later time  NSAID: To be determined at a later time  Other analgesic(s): To be determined at a later time    Interventional management options: Mr. Ryland was informed that there is no guarantee that he would be a candidate for interventional therapies. The decision will be based on the results of diagnostic studies, as well as Mr. Wilkinson risk profile.  Procedure(s) under consideration:  ***   Provider-requested follow-up: No follow-ups on file.  Future Appointments  Date Time Provider [place]  04/04/2020 11:00 AM Gillis Santa, MD [facility] None    Note by: Gillis Santa, MD Date: 04/04/2020; Time: 2:52 PM

## 2020-04-03 NOTE — Progress Notes (Signed)
HD Completed     04/03/20 1600  Vital Signs  Temp 98.3 F (36.8 C)  Temp Source Oral  Pulse Rate 61  Pulse Rate Source Dinamap  Resp 14  BP 138/66  BP Location Right Arm  BP Method Automatic  Patient Position (if appropriate) Lying  Oxygen Therapy  SpO2 100 %  O2 Device Nasal Cannula  O2 Flow Rate (L/min) 2 L/min  During Hemodialysis Assessment  Blood Flow Rate (mL/min) 400 mL/min  Arterial Pressure (mmHg) -180 mmHg  Venous Pressure (mmHg) 150 mmHg  Transmembrane Pressure (mmHg) 50 mmHg  Ultrafiltration Rate (mL/min) 440 mL/min  Dialysate Flow Rate (mL/min) 600 ml/min  Conductivity: Machine  13.5  HD Safety Checks Performed Yes  Dialysis Fluid Bolus Normal Saline  Bolus Amount (mL) 250 mL  Intra-Hemodialysis Comments Tx completed

## 2020-04-03 NOTE — Progress Notes (Signed)
Post HD Assessment    04/03/20 1616  Neurological  Level of Consciousness Alert  Orientation Level Oriented to person;Oriented to place;Oriented to situation;Disoriented to time  Respiratory  Respiratory Pattern Regular;Unlabored  Chest Assessment Chest expansion symmetrical  Bilateral Breath Sounds Clear;Diminished  Cardiac  Pulse Regular  Heart Sounds S1, S2  Jugular Venous Distention (JVD) No  ECG Monitor Yes  Cardiac Rhythm SB  Antiarrhythmic device No  Vascular  R Radial Pulse +2  L Radial Pulse +2  Musculoskeletal  Musculoskeletal (WDL) X  Generalized Weakness Yes  GU Assessment  Genitourinary (WDL) X  Genitourinary Symptoms Anuria  Psychosocial  Psychosocial (WDL) WDL

## 2020-04-03 NOTE — TOC Progression Note (Addendum)
Transition of Care [facility]) - Progression Note    Patient Details  Name: [name] MRN: [id number] Date of Birth: 11-17-1946  Transition of Care [facility]) CM/SW [place], LCSW Phone Number: 04/03/2020, 9:41 AM  Clinical Narrative: Left voicemail for patient's DSS guardian. Will discuss PT's SNF recommendation when she calls back.    12:41 pm: Spoke to [employer] at [facility] [employer]. She is agreeable to SNF placement. No preferences. Sent out referral.  3:23 pm: Tried calling [facility] to give bed offers. Voicemail is full.  Expected Discharge Plan: Group Home    Expected Discharge Plan and Services Expected Discharge Plan: Group Home   Discharge Planning Services: CM Consult                                           Social Determinants of Health (SDOH) Interventions    Readmission Risk Interventions Readmission Risk Prevention Plan 03/24/2020 07/12/2019  Transportation Screening Complete Complete  PCP or Specialist Appt within 5-7 Days - Complete  Home Care Screening - Complete  Medication Review (RN CM) - Complete  Palliative Care Screening Not Applicable -  Medication Review (RN Care Manager) Complete -  Some recent data might be hidden

## 2020-04-03 NOTE — Progress Notes (Signed)
PROGRESS NOTE    Brian Fleming  PTW:[id number] DOB: Mar 17, 1946 DOA: 03/22/2020 PCP: [facility]      Brief Narrative:  [name] is a 74 y.o. M with HTN, A. fib on Eliquis, ESRD on HD MWF, chronic back pain, A. fib on Eliquis with a history of chronic back pain, who developed worsening of his chronic back pain in the last few months.    Seen in the ER 5 times since Feb for back pain, and finally imaging obtained by MRI that showed progressed endplate remodeling at Z0-0, unable to exclude discitis, osteomyelitis.  Admitted for work up of same.            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  IR was consulted for this biopsy, but the patient was not able to tolerate and this was unsuccessful.  Infectious disease was consulted, and we have determined to attempt a trial of IV antibiotics.  -Continue vancomycin 1 g with dialysis and ceftazidime 2 g with dialysis for 2 weeks (until around 4/21)  -In 2 weeks (on or around 4/20) will repeat ESR/CRP, reevaluate by ID either inpatient if patient still here or as outpatient to determine need for ongoing antibiotics at that time  -Continue lidocaine patch as patient derives relief from this -Continue TLSO brace to be worn at all times when out of bed, per neurosurgery (however patient has refused evaluation) -Continue neurosurgery follow-up     Essential hypertension Blood pressure normal -Hold amlodipine, hydralazine -Continue ramipril  End stage renal disease on dialysis MWF(HCC) Secondary Hyperparathyroidism Hyperkalemia -Consult nephrology for maintenance dialysis, appreciate  recommendations -Continue sevelamer, Rena-Vite  Atrial flutter (HCC) -Continue apixaban   Anemia with chronic kidney disease  Neuropathy -Continue gabapentin   Anemia of chronic renal failure Stable, no clinical bleeding         Disposition: Status is: Inpatient  Remains inpatient appropriate because:Unsafe d/c plan   Dispo: The patient is from: SNF              Anticipated d/c is to: SNF              Anticipated d/c date is: Whenever bed available              Patient currently is medically stable to d/c.              MDM: The below labs and imaging reports reviewed and summarized above.  Medication management as above.   DVT prophylaxis: N/A on Eliquis Code Status: FULL Family Communication:     Consultants:     Procedures:     Antimicrobials:      Culture data:              Subjective: No nursing complaints.  Patient is poor historian, but reports no pain in head, chest, abdomen or extremities today.  No fever.  No dysepnea or respiratory distress.  Objective: Vitals:   04/02/20 1423 04/02/20 1445 04/02/20 2116 04/03/20 0628  BP: (!) 90/57  (!) 115/55 (!) 150/63  Pulse: (!) 126 61 82 60  Resp: 18  20   Temp: 98 F (36.7 C)  98.4 F (36.9 C) 98.5 F (36.9 C)  TempSrc: Oral  Oral Oral  SpO2:  97%  94% 100%  Weight:      Height:        Intake/Output Summary (Last 24 hours) at 04/03/2020 1025 Last data filed at 04/02/2020 1345 Gross per 24 hour  Intake 240 ml  Output --  Net 240 ml   Filed Weights   03/22/20 1609 03/23/20 0727 03/24/20 0920  Weight: 91 kg 91 kg 91 kg    Examination: General appearance: elderly adult male, alert and in no acute distress.  Getting shaved by NT   Cardiac: RRR, no murmurs appreciated.  No LE edema.    Respiratory: Normal respiratory rate and rhythm.  CTAB without rales or wheezes. Abdomen: Abdomen soft.  No TTP or gaurding. No ascites, distension, hepatosplenomegaly.       Psych: Sensorium intact and responding to questions, attention slightly diminished. Affect blunted.  Judgment and insight appear impaired.       Data Reviewed: I have personally reviewed following labs and imaging studies:  CBC: Recent Labs  Lab 03/28/20 0533 03/29/20 0614 03/31/20 0551 03/31/20 1430 04/03/20 0451  WBC 5.7 5.5 4.9 4.3 5.9  HGB 10.6* 10.3* 9.8* 9.3* 9.7*  HCT 32.8* 32.7* 31.4* 29.4* 29.8*  MCV 95.9 98.5 99.7 97.7 95.8  PLT 179 165 171 157 017   Basic Metabolic Panel: Recent Labs  Lab 03/28/20 0533 03/29/20 0614 03/31/20 0551 03/31/20 0900 04/03/20 0451  NA 141 141 139 137 136  K 5.0 4.2 4.4 3.9 4.3  CL 95* 100 96* 97* 94*  CO2 '28 29 30 28 29  '$ GLUCOSE 90 98 104* 153* 99  BUN 50* 29* 45* 45* 59*  CREATININE 9.31* 6.97* 7.89* 8.56* 10.31*  CALCIUM 9.8 9.7 10.1 9.4 10.1  MG 2.2 2.0  --   --   --   PHOS 7.3* 5.7* 6.6* 6.1*  --    GFR: Estimated Creatinine Clearance: 7.2 mL/min (A) (by C-G formula based on SCr of 10.31 mg/dL (H)). Liver Function Tests: Recent Labs  Lab 03/31/20 0551 03/31/20 0900  ALBUMIN 2.8* 2.5*   No results for input(s): LIPASE, AMYLASE in the last 168 hours. No results for input(s): AMMONIA in the last 168 hours. Coagulation Profile: No results for input(s): INR, PROTIME in the last 168 hours. Cardiac Enzymes: No results for input(s): CKTOTAL, CKMB, CKMBINDEX, TROPONINI in the last 168 hours. BNP (last 3 results) No results for input(s): PROBNP in the last 8760 hours. HbA1C: No results for input(s): HGBA1C in the last 72 hours. CBG: No results for input(s): GLUCAP in the last 168 hours. Lipid Profile: No results for input(s): CHOL, HDL, LDLCALC, TRIG, CHOLHDL, LDLDIRECT in the last 72 hours. Thyroid Function Tests: No results for input(s): TSH, T4TOTAL, FREET4, T3FREE, THYROIDAB in the last 72 hours. Anemia Panel: No results for input(s): VITAMINB12, FOLATE, FERRITIN, TIBC, IRON, RETICCTPCT in the last 72  hours. Urine analysis: No results found for: COLORURINE, APPEARANCEUR, LABSPEC, PHURINE, GLUCOSEU, HGBUR, BILIRUBINUR, KETONESUR, PROTEINUR, UROBILINOGEN, NITRITE, LEUKOCYTESUR Sepsis Labs: '@LABRCNTIP'$ (procalcitonin:4,lacticacidven:4)  ) No results found for this or any previous visit (from the past 240 hour(s)).       Radiology Studies: No results found.      Scheduled Meds: . apixaban  5 mg Oral BID  . Chlorhexidine Gluconate Cloth  6 each Topical Q0600  . epoetin (EPOGEN/PROCRIT) injection  10,000 Units Intravenous Q M,W,F-HD  . feeding supplement (NEPRO CARB STEADY)  237 mL Oral BID BM  . gabapentin  300 mg Oral QHS  . lidocaine  2 patch Transdermal BID  .  multivitamin  1 tablet Oral QHS  . ramipril  10 mg Oral Daily  . sevelamer carbonate  2,400 mg Oral TID WC  . sodium zirconium cyclosilicate  10 g Oral Daily   Continuous Infusions: . sodium chloride Stopped (03/31/20 1958)  . cefTAZidime (FORTAZ)  IV Stopped (03/31/20 2030)  . vancomycin Stopped (03/31/20 1811)     LOS: 11 days    Time spent: 25 minutes    Edwin Dada, MD [facility] 04/03/2020, 10:25 AM     Please page though [place] or Epic secure chat:  For [employer], Adult nurse

## 2020-04-04 ENCOUNTER — Ambulatory Visit: Payer: Medicare Other | Admitting: Student in an Organized Health Care Education/Training Program

## 2020-04-04 LAB — RESPIRATORY PANEL BY RT PCR (FLU A&B, COVID)
Influenza A by PCR: NEGATIVE
Influenza B by PCR: NEGATIVE
SARS Coronavirus 2 by RT PCR: NEGATIVE

## 2020-04-04 MED ORDER — SODIUM CHLORIDE 0.9 % IV SOLN: 2.0000 g | INTRAVENOUS | Status: AC

## 2020-04-04 MED ORDER — GABAPENTIN 300 MG PO CAPS: 300.0000 mg | ORAL_CAPSULE | Freq: Every day | ORAL | Status: AC

## 2020-04-04 MED ORDER — VANCOMYCIN HCL IN DEXTROSE 1-5 GM/200ML-% IV SOLN: 1000.0000 mg | INTRAVENOUS | 0 refills | Status: AC

## 2020-04-04 MED ORDER — NEPRO/CARBSTEADY PO LIQD: 237.0000 mL | Freq: Two times a day (BID) | ORAL | 0 refills | Status: AC

## 2020-04-04 MED ORDER — EPOETIN ALFA 10000 UNIT/ML IJ SOLN: 10000.0000 [IU] | INTRAMUSCULAR | Status: AC

## 2020-04-04 MED ORDER — LIDOCAINE 5 % EX PTCH: 2.0000 | MEDICATED_PATCH | Freq: Two times a day (BID) | CUTANEOUS | 0 refills | Status: AC

## 2020-04-04 NOTE — Progress Notes (Signed)
[facility]  ROUNDING NOTE   Subjective:   Doing fair No acute c/o Denies any shortness of breath No nausea or vomiting No leg edema States that he tried walking with physical therapist.  PT notes say that he had limited mobility due to severe back pain.  Wheelchair recommended  Objective:  Vital signs in last 24 hours:  Temp:  [97.7 F (36.5 C)-98.3 F (36.8 C)] 98.2 F (36.8 C) (04/13 0417) Pulse Rate:  [40-62] 60 (04/13 0417) Resp:  [12-20] 20 (04/13 0417) BP: (92-138)/(46-90) 129/58 (04/13 0417) SpO2:  [95 %-100 %] 100 % (04/13 0417)  Weight change:  Filed Weights   03/23/20 0727 03/24/20 0920 04/03/20 1220  Weight: 91 kg 91 kg 85.5 kg    Intake/Output: I/O last 3 completed shifts: In: 120 [P.O.:120] Out: 1271 [Other:1271]   Intake/Output this shift:  No intake/output data recorded.  Physical Exam: General: No acute distress, laying in bed  Head: Normocephalic, atraumatic. Moist oral mucosal membranes  Eyes: Anicteric  Neck: Supple,    Lungs:  Clear to auscultation, normal effort, room air  Heart: regular  Abdomen:  Soft, nontender, bowel sounds present  Extremities: No peripheral edema.  Neurologic: Awake, alert, following commands  Skin: No lesions  Access: LUE AVF    Basic Metabolic Panel: Recent Labs  Lab 03/29/20 0614 03/29/20 0614 03/31/20 0551 03/31/20 0900 04/03/20 0451  NA 141  --  139 137 136  K 4.2  --  4.4 3.9 4.3  CL 100  --  96* 97* 94*  CO2 29  --  30 28 29   GLUCOSE 98  --  104* 153* 99  BUN 29*  --  45* 45* 59*  CREATININE 6.97*  --  7.89* 8.56* 10.31*  CALCIUM 9.7   < > 10.1 9.4 10.1  MG 2.0  --   --   --   --   PHOS 5.7*  --  6.6* 6.1*  --    < > = values in this interval not displayed.    Liver Function Tests: Recent Labs  Lab 03/31/20 0551 03/31/20 0900  ALBUMIN 2.8* 2.5*   No results for input(s): LIPASE, AMYLASE in the last 168 hours. No results for input(s): AMMONIA in the last 168  hours.  CBC: Recent Labs  Lab 03/29/20 0614 03/31/20 0551 03/31/20 1430 04/03/20 0451  WBC 5.5 4.9 4.3 5.9  HGB 10.3* 9.8* 9.3* 9.7*  HCT 32.7* 31.4* 29.4* 29.8*  MCV 98.5 99.7 97.7 95.8  PLT 165 171 157 193    Cardiac Enzymes: No results for input(s): CKTOTAL, CKMB, CKMBINDEX, TROPONINI in the last 168 hours.  BNP: Invalid input(s): POCBNP  CBG: No results for input(s): GLUCAP in the last 168 hours.  Microbiology: Results for orders placed or performed during the hospital encounter of 03/22/20  SARS CORONAVIRUS 2 (TAT 6-24 HRS) Nasopharyngeal Nasopharyngeal Swab     Status: None   Collection Time: 03/23/20  8:03 PM   Specimen: Nasopharyngeal Swab  Result Value Ref Range Status   SARS Coronavirus 2 NEGATIVE NEGATIVE Final    Comment: (NOTE) SARS-CoV-2 target nucleic acids are NOT DETECTED. The SARS-CoV-2 RNA is generally detectable in upper and lower respiratory specimens during the acute phase of infection. Negative results do not preclude SARS-CoV-2 infection, do not rule out co-infections with other pathogens, and should not be used as the sole basis for treatment or other patient management decisions. Negative results must be combined with clinical observations, patient history, and epidemiological information.  The expected result is Negative. Fact Sheet for Patients: SugarRoll.be Fact Sheet for Healthcare Providers: https://www.woods-mathews.com/ This test is not yet approved or cleared by the [place] FDA and  has been authorized for detection and/or diagnosis of SARS-CoV-2 by FDA under an Emergency Use Authorization (EUA). This EUA will remain  in effect (meaning this test can be used) for the duration of the COVID-19 declaration under Section 56 4(b)(1) of the Act, 21 U.S.C. section 360bbb-3(b)(1), unless the authorization is terminated or revoked sooner. Performed at [facility], [place] [address]  [place], [place] [address]     Coagulation Studies: No results for input(s): LABPROT, INR in the last 72 hours.  Urinalysis: No results for input(s): COLORURINE, LABSPEC, PHURINE, GLUCOSEU, HGBUR, BILIRUBINUR, KETONESUR, PROTEINUR, UROBILINOGEN, NITRITE, LEUKOCYTESUR in the last 72 hours.  Invalid input(s): APPERANCEUR    Imaging: No results found.   Medications:   . sodium chloride Stopped (03/31/20 1958)  . cefTAZidime (FORTAZ)  IV 2 g (04/03/20 1709)  . vancomycin 1,000 mg (04/03/20 1501)   . apixaban  5 mg Oral BID  . Chlorhexidine Gluconate Cloth  6 each Topical Q0600  . epoetin (EPOGEN/PROCRIT) injection  10,000 Units Intravenous Q M,W,F-HD  . feeding supplement (NEPRO CARB STEADY)  237 mL Oral BID BM  . gabapentin  300 mg Oral QHS  . lidocaine  2 patch Transdermal BID  . multivitamin  1 tablet Oral QHS  . ramipril  10 mg Oral Daily  . sevelamer carbonate  2,400 mg Oral TID WC  . sodium zirconium cyclosilicate  10 g Oral Daily   sodium chloride, HYDROcodone-acetaminophen, labetalol, ondansetron **OR** ondansetron (ZOFRAN) IV, senna-docusate  Assessment/ Plan:  [name] is a 74 y.o. black male with end-stage renal disease on hemodialysis, hypertension, hyperlipidemia, degenerative disc disease, admitted to [facility] on 03/22/2020 for Back pain [M54.9] Intractable back pain [M54.9] Acute midline low back pain without sciatica [M54.5]  [facility] [address] MWF  #  ESRD on HD MWF: with hyperkalemia.  Next HD planned on Monday    #  Secondary Hyperparathyroidism:  Lab Results  Component Value Date   PTH 242 (H) 02/08/2020   CALCIUM 10.1 04/03/2020   PHOS 6.1 (H) 03/31/2020  - sevelamer with meals.   # Anemia with chronic kidney disease: Lab Results  Component Value Date   HGB 9.7 (L) 04/03/2020   EPO with HD treatment  # Disciitis: unable to get biopsy - empiric antibiotics: ceftazidime 2g and vancomycin 1g for each hemodialysis treatment    Per ID recs from 4/7, check CRP in 2 weeks If no improvement extend Abx treatment to 6 weeks    Component Value Date/Time   CRP 10.5 (H) 04/03/2020 0451   CRP 16.4 (H) 03/29/2020 1226   CRP 19.1 (H) 03/22/2020 1752      LOS: 12 Nashalie Sallis 4/13/20211:51 PM

## 2020-04-04 NOTE — Progress Notes (Signed)
Initial Nutrition Assessment  DOCUMENTATION CODES:   Not applicable  INTERVENTION:   Nepro Shake po BID, each supplement provides 425 kcal and 19 grams protein  Rena-vite daily   NUTRITION DIAGNOSIS:   Increased nutrient needs related to chronic illness(ESRD on HD) as evidenced by increased estimated needs.  GOAL:   Patient will meet greater than or equal to 90% of their needs  MONITOR:   PO intake, Supplement acceptance, Labs, Weight trends, Skin, I & O's  REASON FOR ASSESSMENT:   LOS    ASSESSMENT:   74 y.o. black male with end-stage renal disease on hemodialysis, hypertension, hyperlipidemia, degenerative disc disease, admitted to [facility] on 03/22/2020 for Back pain  RD working remotely.  Pt with good appetite and oral intake; pt eating 75-100% of meals in hospital and drinking Nepro supplements. Per chart, pt appears fairly weight stable at baseline. Recommend continue supplements and rena-vite.   Medications reviewed and include: epoetin, renvela, lokelma  Labs reviewed: BUN 59(H), creat 10.31(H) Hgb 9.7(L), Hct 29.8(L)   NUTRITION - FOCUSED PHYSICAL EXAM: Unable to perform at this time   Diet Order:   Diet Order            Diet renal with fluid restriction Fluid restriction: 1200 mL Fluid; Room service appropriate? Yes; Fluid consistency: Thin  Diet effective now             EDUCATION NEEDS:   No education needs have been identified at this time  Skin:  Skin Assessment: Reviewed RN Assessment(MASD)  Last BM:  4/12- type 2  Height:   Ht Readings from Last 1 Encounters:  03/23/20 5\' 8"  (1.727 m)    Weight:   Wt Readings from Last 1 Encounters:  04/03/20 85.5 kg    Ideal Body Weight:  70 kg  BMI:  Body mass index is 28.64 kg/m.  Estimated Nutritional Needs:   Kcal:  2100-2400kcal/day  Protein:  105-120g/day  Fluid:  UOP +1L  Koleen Distance MS, RD, LDN Please refer to [facility] for RD and/or RD on-call/weekend/after hours pager

## 2020-04-04 NOTE — Consult Note (Signed)
Pharmacy Antibiotic Note  Brian Fleming is a 74 y.o. male with a history of chronic discitis-osteomyelitis, ESRD on HD with MWF schedule admitted on 03/22/2020 with osteomyelitis. Inflammatory markers elevated. Pharmacy has been consulted for vancomycin and ceftazidime dosing. This is day #7 of IV antibiotics, no recent febrile episodes, continue through 4/21 per attending MD   Plan:  Continue ceftazidime 2 qHD MWF  continue vancomycin 1gm IV qHD MWF  Check levels prior to 3rd - 4th HD session after resuming 1gm qHD (4/16) if patient has not been discharged  Levels before dialysis  maintenance dose   Height: 5\' 8"  (172.7 cm) Weight: 85.5 kg (188 lb 6.1 oz) IBW/kg (Calculated) : 68.4  Temp (24hrs), Avg:98.1 F (36.7 C), Min:97.7 F (36.5 C), Max:98.3 F (36.8 C)  Recent Labs  Lab 03/29/20 0614 03/31/20 0551 03/31/20 0900 03/31/20 1430 04/03/20 0451  WBC 5.5 4.9  --  4.3 5.9  CREATININE 6.97* 7.89* 8.56*  --  10.31*    Estimated Creatinine Clearance: 7 mL/min (A) (by C-G formula based on SCr of 10.31 mg/dL (H)).    No Known Allergies  Antimicrobials this admission: Vancomycin 4/6 >>  Ceftazidime 4/6 >>   Microbiology results: 4/1 SARS-CoV-2: negative  Thank you for allowing pharmacy to be a part of this patient's care.  Vallery Sa, PharmD, BCPS.   Work Cell: [phone] 04/04/2020 7:07 AM

## 2020-04-04 NOTE — TOC Transition Note (Signed)
Transition of Care [facility]) - CM/SW Discharge Note   Patient Details  Name: Brian Fleming MRN: [id number] Date of Birth: Feb 26, 1946  [facility] CM/SW Contact:  Beverly Sessions, RN Phone Number: 04/04/2020, 3:13 PM   Clinical Narrative:     Received return call from Roni at [place] and notified patient to be discharge.   EMS packet printed.  Bedside RN notified.   Repeat covid negative  EMS called   Final next level of care: [place]     Patient Goals and CMS Choice        Discharge Placement              Patient chooses bed at: [facility] [place] Patient to be transferred to facility by: EMS Name of family member notified: Elenore Rota and [place] Patient and family notified of of transfer: 04/04/20  Discharge Plan and Services   Discharge Planning Services: CM Consult                                 Social Determinants of Health ([place]) Interventions     Readmission Risk Interventions Readmission Risk Prevention Plan 03/24/2020 07/12/2019  Transportation Screening Complete Complete  PCP or Specialist Appt within 5-7 Days - Complete  Home Care Screening - Complete  Medication Review (RN CM) - Complete  Palliative Care Screening Not Applicable -  Medication Review (RN Care Manager) Complete -  Some recent data might be hidden

## 2020-04-04 NOTE — Progress Notes (Signed)
EMS arrived to pick up patient and take him to [facility]. IV removed, intact. Discharge papers given to EMS to give to facility. Anabella, RN called report to facility.

## 2020-04-04 NOTE — Progress Notes (Signed)
Physical Therapy Treatment Patient Details Name: Brian Fleming MRN: [id number] DOB: March 07, 1946 Today's Date: 04/04/2020    History of Present Illness Brian Fleming is a 74 y.o. male with medical history significant for hypertension, A. fib on Eliquis, ESRD on HD MWF, with a history of chronic back pain, hospitalized in February 2021 for intractable back pain who returns to the emergency room with a complaint of back pain that has been worse over the past 3 days.  Denies weakness in the extremities or in the groin area, denies bladder or bowel problems.    PT Comments    Pt asleep upon entry, awakens to voice, is calm, participatory, interactive. ModA to EOB, MinGuardA for STS from elevated bed and RW, heavy effort and using BUE to pull up on RW (author stabilizes RW down). Pt performs transfers at EOB and static stance for balance of 30sec. Pt agreeable to attempt AMB to doorway and back, very taxing and pain provoking. Pt has severe back pain after session, attempts to recover seated, but ultimately returns to side lying in bed for pain management. Given the severity of the patient's LSS and chronicity of postural and mobility limitations, it really would make more sense for this patient to use a W/C as primary means of AMB. I do not anticipate a great prognosis in advancing his ambulatory status to a significant degree. A W/C would make the patient more independent in mobility and safer. He seems motivated to continue to pursue short distance AMB and stand pivot transfers as necessary and as tolerated.    Follow Up Recommendations  SNF     Equipment Recommendations  Wheelchair (measurements PT);Wheelchair cushion (measurements PT)(anti tipper devices)    Recommendations for Other Services       Precautions / Restrictions Precautions Precautions: Fall Required Braces or Orthoses: Spinal Brace Spinal Brace: Lumbar corset Restrictions Weight Bearing Restrictions: No    Mobility  Bed Mobility Overal bed mobility: Needs Assistance       Supine to sit: Mod assist Sit to supine: Max assist      Transfers   Equipment used: Rolling walker (2 wheeled) Transfers: Sit to/from Stand Sit to Stand: From elevated surface;Min guard            Ambulation/Gait Ambulation/Gait assistance: Min guard Gait Distance (Feet): 28 Feet Assistive device: Rolling walker (2 wheeled)       General Gait Details: chronic postural contorsion 2/2 severe chronic spinal stenosis. Appears unsafe, but pt is steady and has good awareness of safety.   Stairs             Wheelchair Mobility    Modified Rankin (Stroke Patients Only)       Balance                                            Cognition Arousal/Alertness: Awake/alert Behavior During Therapy: WFL for tasks assessed/performed Overall Cognitive Status: Within Functional Limits for tasks assessed                                        Exercises      General Comments        Pertinent Vitals/Pain Pain Assessment: Faces Faces Pain Scale: Hurts whole lot Pain Location: back Pain Descriptors / Indicators: Moaning;Sharp;Grimacing Pain  Intervention(s): Limited activity within patient's tolerance;Monitored during session;Premedicated before session;Repositioned    Home Living                      Prior Function            PT Goals (current goals can now be found in the care plan section) Acute Rehab PT Goals Patient Stated Goal: To go home PT Goal Formulation: With patient Time For Goal Achievement: 04/08/20 Potential to Achieve Goals: Fair Progress towards PT goals: Progressing toward goals    Frequency    Min 2X/week      PT Plan Current plan remains appropriate    Co-evaluation              AM-PAC PT "6 Clicks" Mobility   Outcome Measure  Help needed turning from your back to your side while in a flat bed without using bedrails?: A  Lot Help needed moving from lying on your back to sitting on the side of a flat bed without using bedrails?: A Lot Help needed moving to and from a bed to a chair (including a wheelchair)?: A Lot Help needed standing up from a chair using your arms (e.g., wheelchair or bedside chair)?: A Lot Help needed to walk in hospital room?: A Little Help needed climbing 3-5 steps with a railing? : Total 6 Click Score: 12    End of Session Equipment Utilized During Treatment: Gait belt;Back brace Activity Tolerance: Patient limited by pain Patient left: in bed;with call bell/phone within reach Nurse Communication: Mobility status PT Visit Diagnosis: Difficulty in walking, not elsewhere classified (R26.2);Muscle weakness (generalized) (M62.81);Pain Pain - Right/Left: (Low back and BLE)     Time: 7614-7092 PT Time Calculation (min) (ACUTE ONLY): 15 min  Charges:  $Therapeutic Exercise: 8-22 mins                     1:24 PM, 04/04/20 Etta Grandchild, PT, DPT Physical Therapist - [facility]  [phone] ([place])     [place] C 04/04/2020, 1:18 PM

## 2020-04-04 NOTE — TOC Progression Note (Addendum)
Transition of Care [facility]) - Progression Note    Patient Details  Name: Brian Fleming MRN: [id number] Date of Birth: 1946-07-30  Transition of Care [facility]) CM/SW Contact  Beverly Sessions, RN Phone Number: 04/04/2020, 9:10 AM  Clinical Narrative:     9:08 AM - called Barrie Lyme with DSS to present bed offers  9:09 am-  VM left for Roni at DSS to present bed offers   9:15 am  - received return call from [facility].  Presented bed offers.  She will provide this information to her supervisor and notify me of the decision   1335 pm - voicemail left for Roni to follow up on bed offers  1340 pm -  Received return call from [facility] at [place].  Accepted bed at [facility].  Accepted in the [place] and notified at [place] at [facility].  MD updated   1416 pm - Per MD patient to discharge to [facility] today.  DC info sent in the hub.  Rapid covid test ordered.  Voicemail left for both Roni and Kailee at [place] to notify them patient would be discharging and his room assignment.  I have requested a return call to notify me they received my message  Expected Discharge Plan: Group Home    Expected Discharge Plan and Services Expected Discharge Plan: Group Home   Discharge Planning Services: CM Consult                                           Social Determinants of Health (SDOH) Interventions    Readmission Risk Interventions Readmission Risk Prevention Plan 03/24/2020 07/12/2019  Transportation Screening Complete Complete  PCP or Specialist Appt within 5-7 Days - Complete  Home Care Screening - Complete  Medication Review (RN CM) - Complete  Palliative Care Screening Not Applicable -  Medication Review (RN Care Manager) Complete -  Some recent data might be hidden

## 2020-04-04 NOTE — Discharge Summary (Signed)
Physician Discharge Summary [facility]    Patient: Brian Fleming                   Admit date: 03/22/2020   DOB: 1946-10-08             Discharge date:04/04/2020/2:00 PM [id number]                          PCP: [place]  Disposition:SNF  Recommendations for Outpatient Follow-up:   . Follow up: in 2 week  Discharge Condition: Stable   Code Status:   Code Status: Full Code  Diet recommendation: Cardiac diet/renal   Discharge Diagnoses:    Principal Problem:   Intractable back pain Active Problems:   Essential hypertension   End stage renal disease on dialysis ([place])   Hyperkalemia   Atrial flutter (HCC)   Back pain   Spinal stenosis   Discitis of lumbar region   Anemia   History of Present Illness/ Hospital Course Brian Fleming Summary:   Brian Fleming is a 74 y.o. M with HTN, A. fib on Eliquis, ESRD on HD MWF, chronic back pain, A. fib on Eliquis with a history of chronic back pain, who developed worsening of his chronic back pain in the last few months.    Seen in the ER 5 times since Feb for back pain, and finally imaging obtained by MRI that showed progressed endplate remodeling at K9-1, unable to exclude discitis, osteomyelitis. Admitted for work up of same.    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  IR was consulted for this biopsy, but the patient was not able to tolerate and this was unsuccessful.  Infectious disease was consulted, and we have determined to attempt a trial of IV antibiotics.  -Continue vancomycin 1 g with dialysis and ceftazidime 2 g with dialysis for 2 weeks  (until around 4/21)  -In 2 weeks (on or around  4/20) will repeat ESR/CRP, reevaluate by ID either inpatient if patient still here or as outpatient to determine need for ongoing antibiotics at that time  -Continue lidocaine patch as patient derives relief from this -Continue TLSO brace to be worn at all times when out of bed, per neurosurgery (however patient has refused evaluation) - neurosurgery follow-up     Essential hypertension Blood pressure normal -D/ced amlodipine, hydralazine -Continue ramipril  End stage renal disease on dialysis MWF(HCC) Secondary Hyperparathyroidism Hyperkalemia - nephrology for maintenance dialysis, -Continue sevelamer, Rena-Vite  Atrial flutter (HCC) -Continue apixaban   Anemia with chronic kidney disease  Neuropathy -Continue gabapentin   Anemia of chronic renal failure Stable, no clinical bleeding   Disposition: Stable today to be discharged to SNF, continue hemodialysis, recommend antibiotics till  April 21, 2021Dispo:  The patient is from: SNF Anticipated d/c is to: SNF  Nutritional status:  Nutrition Problem: Increased nutrient needs Etiology: chronic illness(ESRD on HD) Signs/Symptoms: estimated needs     Discharge Instructions:   Discharge Instructions    Activity as tolerated - No restrictions   Complete by: As directed    Call MD for:  difficulty breathing, headache or visual disturbances   Complete by: As directed    Call MD for:  persistant dizziness or light-headedness   Complete  by: As directed    Call MD for:  severe uncontrolled pain   Complete by: As directed    Call MD for:  temperature >100.4   Complete by: As directed    Diet - low sodium heart healthy   Complete by: As directed    Discharge instructions   Complete by: As directed    Continue antibiotics till April 12, 2020 (with hemodialysis) Continue  PT OT Continue current hemodialysis schedule Follow-up with infectious disease in 1 to 2 weeks   Increase activity slowly    Complete by: As directed        Medication List    STOP taking these medications   amLODipine 10 MG tablet Commonly known as: NORVASC     TAKE these medications   apixaban 5 MG Tabs tablet Commonly known as: ELIQUIS Take 1 tablet (5 mg total) by mouth 2 (two) times daily.   aspirin EC 81 MG tablet Take 81 mg by mouth daily.   cefTAZidime 2 g in sodium chloride 0.9 % 100 mL Inject 2 g into the vein every Monday, Wednesday, and Friday with hemodialysis. Start taking on: April 05, 2020   docusate sodium 100 MG capsule Commonly known as: Colace Take 1 capsule (100 mg total) by mouth daily as needed.   epoetin alfa 10000 UNIT/ML injection Commonly known as: EPOGEN Inject 1 mL (10,000 Units total) into the vein every Monday, Wednesday, and Friday with hemodialysis. Start taking on: April 05, 2020   feeding supplement (NEPRO CARB STEADY) Liqd Take 237 mLs by mouth 2 (two) times daily between meals. Start taking on: April 05, 2020   gabapentin 300 MG capsule Commonly known as: NEURONTIN Take 1 capsule (300 mg total) by mouth at bedtime. What changed: when to take this   HYDROcodone-acetaminophen 5-325 MG tablet Commonly known as: NORCO/VICODIN Take 1 tablet by mouth every 4 (four) hours as needed for moderate pain.   lidocaine 5 % Commonly known as: Lidoderm Place 2 patches onto the skin 2 (two) times daily. Remove & Discard patch within 12 hours or as directed by MD What changed:   how much to take  when to take this   ramipril 10 MG capsule Commonly known as: ALTACE Take 10 mg by mouth daily.   RENA-VITE PO Take 1 tablet by mouth daily.   rosuvastatin 10 MG tablet Commonly known as: CRESTOR Take 10 mg by mouth at bedtime.   sevelamer carbonate 800 MG tablet Commonly known as: RENVELA Take 2,400 mg by mouth 3 (three) times daily.   traMADol 50 MG tablet Commonly known as: Ultram Take 1 tablet (50 mg total) by mouth every 12 (twelve) hours as needed for  severe pain (for pain no relieved by tylenol).   vancomycin 1-5 GM/200ML-% Soln Commonly known as: VANCOCIN Inject 200 mLs (1,000 mg total) into the vein every Monday, Wednesday, and Friday with hemodialysis. Start taking on: April 05, 2020      Contact information for after-discharge care    Destination    [facility] Preferred SNF .   Service: Skilled Nursing Contact information: [address] [place] [phone]             No Known Allergies   Procedures /Studies:   DG Chest 2 View  Result Date: 03/24/2020 CLINICAL DATA:  Screening for TB EXAM: CHEST - 2 VIEW COMPARISON:  02/10/2020, CT 01/25/2019 FINDINGS: Rotated patient. Enlarged cardiomediastinal silhouette with vascular congestion. Mild diffuse chronic appearing  interstitial opacity. Possible trace right pleural effusion. No pneumothorax. IMPRESSION: 1. No focal pneumonia. 2. Cardiomegaly with vascular congestion and suspected trace right pleural effusion Electronically Signed   By: Donavan Foil M.D.   On: 03/24/2020 18:30   CT Lumbar Spine Wo Contrast  Result Date: 03/16/2020 CLINICAL DATA:  Back pain. History of renal failure. EXAM: CT LUMBAR SPINE WITHOUT CONTRAST TECHNIQUE: Multidetector CT imaging of the lumbar spine was performed without intravenous contrast administration. Multiplanar CT image reconstructions were also generated. COMPARISON:  CT abdomen/pelvis 03/07/2020 and MRI lumbar spine 07/11/2019 FINDINGS: Segmentation: There are five lumbar type vertebral bodies. The last full intervertebral disc space is labeled L5-S1. Alignment: Normal overall alignment. 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 No worrisome bone lesions or acute fracture. I do not see any definite CT findings suspicious for discitis or osteomyelitis. Paraspinal and other soft tissues: Fairly  prominent inflammatory type changes involving the psoas muscles bilaterally, particularly at C2-3. However, this is stable and unchanged since prior CT scan dating back to 07/11/2019. 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 I do not see any fluid collections to suggest an abscess. Disc levels: Stable severe multilevel disc disease and facet disease due to dialysis related spondyloarthropathy. I do not see any evidence of an acute fracture, discitis or osteomyelitis. IMPRESSION: 1. 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 Stable severe multilevel disc disease and facet disease. 2. No definite CT findings for discitis or osteomyelitis. 3. Stable inflammatory type changes involving the psoas muscles bilaterally, particularly at C2-3. Electronically Signed   By: Marijo Sanes M.D.   On: 03/16/2020 18:27   MR THORACIC SPINE WO CONTRAST  Result Date: 03/23/2020 CLINICAL DATA:  Back pain EXAM: MRI THORACIC AND LUMBAR SPINE WITHOUT CONTRAST TECHNIQUE: Multiplanar and multiecho pulse sequences of the thoracic and lumbar spine were obtained without intravenous contrast. COMPARISON:  Thoracolumbar spine MRI 07/11/2019 FINDINGS: MRI THORACIC SPINE FINDINGS Alignment:  Physiologic. Vertebrae: Endplate remodeling at P5-94 is unchanged compared to 07/11/2019. There is multifocal bone marrow heterogeneity, also unchanged. No acute abnormality. Cord: Assessment of the spinal cord is degraded by motion artifact. There may be a small area of hyperintense T2-weighted signal within the spinal cord at T9-10. Paraspinal and other soft tissues: Negative Disc levels: T9-10: Diffuse disc bulge with narrowing of the ventral thecal sac. No spinal canal stenosis. Other disc levels are unremarkable. MRI LUMBAR SPINE FINDINGS Segmentation:  Normal Alignment: Grade 1 retrolisthesis at L3-4 and grade 1 anterolisthesis at L4-5 Vertebrae: Heterogeneous bone  marrow signal with type 2 Modic changes at L4-5. There is edema within the L2-3 disc space. No adjacent endplate edema. There is low T1-weighted signal throughout most of the lumbar spine. Conus medullaris and cauda equina: Conus extends to the L1-2 level. Conus and cauda equina appear normal. Paraspinal and other soft tissues: Atrophy of the psoas musculature. Diffuse cystic change of both kidneys. Disc levels: T12-L1: Normal disc space and facet joints. There is no spinal canal stenosis. No neural foraminal stenosis. L1-L2: Normal disc space and facet joints. There is no spinal canal stenosis. No neural foraminal stenosis. L2-L3: Disc space edema with diffuse bulge and large osteophytes. Unchanged severe spinal canal stenosis. Unchanged severe bilateral neural foraminal stenosis. L3-L4: Diffuse disc bulge, slightly worsened. Slightly worsened severe spinal canal stenosis. No neural foraminal stenosis. L4-L5: Normal disc space and facet joints. Severe disc space narrowing with intermediate sized disc osteophyte complex and  severe facet arthrosis. Unchanged severe spinal canal stenosis. Unchanged severe bilateral neural foraminal stenosis. L5-S1: Mild disc bulge and moderate facet arthrosis. There is no spinal canal stenosis. Unchanged mild left neural foraminal stenosis. Visualized sacrum: Normal. IMPRESSION: 1. Mild progression of endplate remodeling at F0-9 with slightly increased disc space edema. No edema within the endplates. 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 Correlation with CRP might be helpful. 2. Unchanged severe lumbar spinal canal stenosis and severe bilateral neural foraminal stenosis at L2-3 and L4-5 with marked mass effect on the cauda equina nerve roots at these levels. 3. Endplate remodeling at N2-35, unchanged and likely secondary to hemodialysis associated spondyloarthropathy.  Electronically Signed   By: Ulyses Jarred M.D.   On: 03/23/2020 01:51   MR LUMBAR SPINE WO CONTRAST  Result Date: 03/23/2020 CLINICAL DATA:  Back pain EXAM: MRI THORACIC AND LUMBAR SPINE WITHOUT CONTRAST TECHNIQUE: Multiplanar and multiecho pulse sequences of the thoracic and lumbar spine were obtained without intravenous contrast. COMPARISON:  Thoracolumbar spine MRI 07/11/2019 FINDINGS: MRI THORACIC SPINE FINDINGS Alignment:  Physiologic. Vertebrae: Endplate remodeling at T7-32 is unchanged compared to 07/11/2019. There is multifocal bone marrow heterogeneity, also unchanged. No acute abnormality. Cord: Assessment of the spinal cord is degraded by motion artifact. There may be a small area of hyperintense T2-weighted signal within the spinal cord at T9-10. Paraspinal and other soft tissues: Negative Disc levels: T9-10: Diffuse disc bulge with narrowing of the ventral thecal sac. No spinal canal stenosis. Other disc levels are unremarkable. MRI LUMBAR SPINE FINDINGS Segmentation:  Normal Alignment: Grade 1 retrolisthesis at L3-4 and grade 1 anterolisthesis at L4-5 Vertebrae: Heterogeneous bone marrow signal with type 2 Modic changes at L4-5. There is edema within the L2-3 disc space. No adjacent endplate edema. There is low T1-weighted signal throughout most of the lumbar spine. Conus medullaris and cauda equina: Conus extends to the L1-2 level. Conus and cauda equina appear normal. Paraspinal and other soft tissues: Atrophy of the psoas musculature. Diffuse cystic change of both kidneys. Disc levels: T12-L1: Normal disc space and facet joints. There is no spinal canal stenosis. No neural foraminal stenosis. L1-L2: Normal disc space and facet joints. There is no spinal canal stenosis. No neural foraminal stenosis. L2-L3: Disc space edema with diffuse bulge and large osteophytes. Unchanged severe spinal canal stenosis. Unchanged severe bilateral neural foraminal stenosis. L3-L4: Diffuse disc bulge, slightly  worsened. Slightly worsened severe spinal canal stenosis. No neural foraminal stenosis. L4-L5: Normal disc space and facet joints. Severe disc space narrowing with intermediate sized disc osteophyte complex and severe facet arthrosis. Unchanged severe spinal canal stenosis. Unchanged severe bilateral neural foraminal stenosis. L5-S1: Mild disc bulge and moderate facet arthrosis. There is no spinal canal stenosis. Unchanged mild left neural foraminal stenosis. Visualized sacrum: Normal. IMPRESSION: 1. Mild progression of endplate remodeling at K0-2 with slightly increased disc space edema. No edema within the endplates. 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 Correlation with CRP might be helpful. 2. Unchanged severe lumbar spinal canal stenosis and severe bilateral neural foraminal stenosis at L2-3 and L4-5 with marked mass effect on the cauda equina nerve roots at these levels. 3. Endplate remodeling at R4-27, unchanged and likely secondary to hemodialysis associated spondyloarthropathy. Electronically Signed   By: Ulyses Jarred M.D.   On: 03/23/2020 01:51   CT Renal Stone Study  Result Date: 03/07/2020 CLINICAL DATA:  Flank pain, left lower back pain  EXAM: CT ABDOMEN AND PELVIS WITHOUT CONTRAST TECHNIQUE: Multidetector CT imaging of the abdomen and pelvis was performed following the standard protocol without IV contrast. COMPARISON:  01/27/2017 FINDINGS: Lower chest: Heart is mildly enlarged.  No acute abnormality. Hepatobiliary: Layering gallstones within the gallbladder. No focal hepatic abnormality. Pancreas: Pancreatic ductal dilatation within the pancreatic head, measuring up to 8 mm. This is new since prior study. No visible focal pancreatic abnormality. Spleen: No focal abnormality.  Normal size. Adrenals/Urinary Tract: Extensive renovascular calcifications. Partially calcified exophytic lesion off  the upper pole of the left kidney measures 2.7 cm. Partially calcified right midpole hyperdense lesion measures 1.9 cm. Numerous other low-density lesions throughout the kidneys. These cannot be characterized without intravenous contrast. No hydronephrosis or ureteral stones. Stomach/Bowel: Diffuse colonic diverticulosis. No active diverticulitis. No evidence of bowel obstruction. Normal appendix. Vascular/Lymphatic: Diffuse arterial calcifications. No aneurysm or adenopathy. Reproductive: Mild prostate enlargement. Other: No free fluid or free air. Musculoskeletal: No acute bony abnormality. Advanced degenerative disc disease throughout the lumbar spine. Sclerosis noted throughout the lumbar spine, likely related to renal osteodystrophy. IMPRESSION: No visible ureteral stones or hydronephrosis. Numerous low-density lesions throughout the kidneys bilaterally, some which are partially calcified. These cannot be characterized on this noncontrast study. This could be further evaluated with contrast-enhanced study, ultrasound or MRI. Cholelithiasis. Diffuse colonic diverticulosis. Diffuse aortic and arterial atherosclerosis. Electronically Signed   By: Rolm Baptise M.D.   On: 03/07/2020 09:10     Subjective:   Patient was seen and examined 04/04/2020, 2:00 PM Patient stable today. No acute distress.  No issues overnight Stable for discharge.  Discharge Exam:    Vitals:   04/03/20 1616 04/03/20 1620 04/03/20 1958 04/04/20 0417  BP:  135/64 (!) 97/46 (!) 129/58  Pulse:  (!) 55 (!) 52 60  Resp:   20 20  Temp:   97.7 F (36.5 C) 98.2 F (36.8 C)  TempSrc:   Oral   SpO2: 96% 96% 95% 100%  Weight:      Height:        General: Pt lying comfortably in bed & appears in no obvious distress. Cardiovascular: S1 & S2 heard, RRR, S1/S2 +. No murmurs, rubs, gallops or clicks. No JVD or pedal edema. Respiratory: Clear to auscultation without wheezing, rhonchi or crackles. No increased work of  breathing. Abdominal:  Non-distended, non-tender & soft. No organomegaly or masses appreciated. Normal bowel sounds heard. CNS: Alert and oriented. No focal deficits. Extremities: no edema, no cyanosis    The results of significant diagnostics from this hospitalization (including imaging, microbiology, ancillary and laboratory) are listed below for reference.      Microbiology:   No results found for this or any previous visit (from the past 240 hour(s)).   Labs:   CBC: Recent Labs  Lab 03/29/20 0614 03/31/20 0551 03/31/20 1430 04/03/20 0451  WBC 5.5 4.9 4.3 5.9  HGB 10.3* 9.8* 9.3* 9.7*  HCT 32.7* 31.4* 29.4* 29.8*  MCV 98.5 99.7 97.7 95.8  PLT 165 171 157 675   Basic Metabolic Panel: Recent Labs  Lab 03/29/20 0614 03/31/20 0551 03/31/20 0900 04/03/20 0451  NA 141 139 137 136  K 4.2 4.4 3.9 4.3  CL 100 96* 97* 94*  CO2 _0 GLUCOSE 98 104* 153* 99  BUN 29* 45* 45* 59*  CREATININE 6.97* 7.89* 8.56* 10.31*  CALCIUM 9.7 10.1 9.4 10.1  MG 2.0  --   --   --   PHOS 5.7* 6.6* 6.1*  --  Liver Function Tests: Recent Labs  Lab 03/31/20 0551 03/31/20 0900  ALBUMIN 2.8* 2.5*   BNP (last 3 results) Recent Labs    07/13/19 0419 07/14/19 0536 03/16/20 1650  BNP 508.0* 354.0* 480.0*      Time coordinating discharge: Over 55 minutes  SIGNED: Deatra James, MD, FACP, FHM. [facility],  Pager [phone]  If 7PM-7AM, please contact night-coverage [email or web address], Password [facility] 04/04/2020, 2:00 PM

## 2020-04-21 ENCOUNTER — Encounter: Payer: Self-pay | Admitting: Emergency Medicine

## 2020-04-21 ENCOUNTER — Emergency Department
Admission: EM | Admit: 2020-04-21 | Discharge: 2020-04-21 | Disposition: A | Discharge status: Home / Self Care | Payer: Medicare Other | Attending: Emergency Medicine | Admitting: Emergency Medicine

## 2020-04-21 ENCOUNTER — Other Ambulatory Visit: Payer: Self-pay

## 2020-04-21 DIAGNOSIS — Z992 Dependence on renal dialysis: Secondary | ICD-10-CM | POA: Insufficient documentation

## 2020-04-21 DIAGNOSIS — Z79899 Other long term (current) drug therapy: Secondary | ICD-10-CM | POA: Diagnosis not present

## 2020-04-21 DIAGNOSIS — N186 End stage renal disease: Secondary | ICD-10-CM | POA: Insufficient documentation

## 2020-04-21 DIAGNOSIS — R569 Unspecified convulsions: Secondary | ICD-10-CM | POA: Diagnosis present

## 2020-04-21 DIAGNOSIS — I509 Heart failure, unspecified: Secondary | ICD-10-CM | POA: Diagnosis not present

## 2020-04-21 DIAGNOSIS — F17210 Nicotine dependence, cigarettes, uncomplicated: Secondary | ICD-10-CM | POA: Insufficient documentation

## 2020-04-21 DIAGNOSIS — E871 Hypo-osmolality and hyponatremia: Secondary | ICD-10-CM | POA: Diagnosis not present

## 2020-04-21 DIAGNOSIS — Z7982 Long term (current) use of aspirin: Secondary | ICD-10-CM | POA: Diagnosis not present

## 2020-04-21 DIAGNOSIS — I132 Hypertensive heart and chronic kidney disease with heart failure and with stage 5 chronic kidney disease, or end stage renal disease: Secondary | ICD-10-CM | POA: Diagnosis not present

## 2020-04-21 LAB — CBC WITH DIFFERENTIAL/PLATELET
Abs Immature Granulocytes: 0.02 10*3/uL (ref 0.00–0.07)
Basophils Absolute: 0.1 10*3/uL (ref 0.0–0.1)
Basophils Relative: 2 %
Eosinophils Absolute: 0.4 10*3/uL (ref 0.0–0.5)
Eosinophils Relative: 8 %
HCT: 31.2 % — ABNORMAL LOW (ref 39.0–52.0)
Hemoglobin: 10.3 g/dL — ABNORMAL LOW (ref 13.0–17.0)
Immature Granulocytes: 0 %
Lymphocytes Relative: 19 %
Lymphs Abs: 1 10*3/uL (ref 0.7–4.0)
MCH: 30.8 pg (ref 26.0–34.0)
MCHC: 33 g/dL (ref 30.0–36.0)
MCV: 93.4 fL (ref 80.0–100.0)
Monocytes Absolute: 0.6 10*3/uL (ref 0.1–1.0)
Monocytes Relative: 12 %
Neutro Abs: 3.1 10*3/uL (ref 1.7–7.7)
Neutrophils Relative %: 59 %
Platelets: 160 10*3/uL (ref 150–400)
RBC: 3.34 MIL/uL — ABNORMAL LOW (ref 4.22–5.81)
RDW: 13.7 % (ref 11.5–15.5)
WBC: 5.2 10*3/uL (ref 4.0–10.5)
nRBC: 0 % (ref 0.0–0.2)

## 2020-04-21 LAB — BASIC METABOLIC PANEL
Anion gap: 14 (ref 5–15)
BUN: 36 mg/dL — ABNORMAL HIGH (ref 8–23)
CO2: 31 mmol/L (ref 22–32)
Calcium: 9.7 mg/dL (ref 8.9–10.3)
Chloride: 86 mmol/L — ABNORMAL LOW (ref 98–111)
Creatinine, Ser: 6.99 mg/dL — ABNORMAL HIGH (ref 0.61–1.24)
GFR calc Af Amer: 8 mL/min — ABNORMAL LOW (ref >60)
GFR calc non Af Amer: 7 mL/min — ABNORMAL LOW (ref >60)
Glucose, Bld: 87 mg/dL (ref 70–99)
Potassium: 4.6 mmol/L (ref 3.5–5.1)
Sodium: 131 mmol/L — ABNORMAL LOW (ref 135–145)

## 2020-04-21 NOTE — ED Provider Notes (Signed)
[facility] Emergency Department Provider Note   ____________________________________________   I have reviewed the triage vital signs and the nursing notes.   HISTORY  Chief Complaint Possible seizure  History limited by: Not Limited   HPI Brian Fleming is a 74 y.o. male who presents to the emergency department today from dialysis because of concerns for possible seizure.  Slightly unclear why they thought a seizure occurred.  Per EMS staff states he was sitting in his dialysis chair when he fell asleep and his eyes perhaps rolled back to his head.  However the there was no jerking motions observed.  Patient was not incontinent (states he does still make some urine) nor did he bite his tongue.  The patient apparently was not post ictal.  Patient has no history of seizures.  He does take Benadryl prior to his dialysis sessions. Denies any recent illness. Denies any chest pain.  Records reviewed. Per medical record review patient has a history of atrial flutter, HLD, HTN.  Past Medical History:  Diagnosis Date  . Atrial flutter ([place])    a. dates back to at least 2013; b. CHADS2VASc 5 (HTN, age x 1, stroke x 2, vascular disease); c. started on Eliquis 06/2019  . DJD (degenerative joint disease)   . End stage renal disease on dialysis ([place])   . Hyperlipidemia   . Hypertension     Patient Active Problem List   Diagnosis Date Noted  . Spinal stenosis 03/26/2020  . Discitis of lumbar region 03/26/2020  . Anemia 03/26/2020  . Back pain 03/23/2020  . End stage renal disease on dialysis ([place])   . Hyperkalemia   . HLD (hyperlipidemia)   . GERD (gastroesophageal reflux disease)   . Atrial flutter ([place])   . Intractable back pain   . Tobacco abuse   . Fall 07/11/2019  . HCAP (healthcare-associated pneumonia) 01/26/2019  . Pneumonia 01/25/2019  . End stage renal disease ([place]) 12/04/2017  . Essential hypertension 12/04/2017  . Hyperlipidemia 12/04/2017  .  Generalized weakness 04/11/2016  . CHF (congestive heart failure) ([place]) 08/20/2015    Past Surgical History:  Procedure Laterality Date  . AV FISTULA PLACEMENT    . BACK SURGERY      Prior to Admission medications   Medication Sig Start Date End Date Taking? Authorizing Provider  apixaban (ELIQUIS) 5 MG TABS tablet Take 1 tablet (5 mg total) by mouth 2 (two) times daily. Patient not taking: Reported on 03/23/2020 07/14/19   Vaughan Basta, MD  aspirin EC 81 MG tablet Take 81 mg by mouth daily.    [provider]  B Complex-C-Folic Acid (RENA-VITE PO) Take 1 tablet by mouth daily. 10/27/14   [provider]  cefTAZidime 2 g in sodium chloride 0.9 % 100 mL Inject 2 g into the vein every Monday, Wednesday, and Friday with hemodialysis. 04/05/20   Shahmehdi, Valeria Batman, MD  docusate sodium (COLACE) 100 MG capsule Take 1 capsule (100 mg total) by mouth daily as needed. 03/07/20 03/07/21  Earleen Newport, MD  epoetin alfa (EPOGEN) 10000 UNIT/ML injection Inject 1 mL (10,000 Units total) into the vein every Monday, Wednesday, and Friday with hemodialysis. 04/05/20   Shahmehdi, Valeria Batman, MD  gabapentin (NEURONTIN) 300 MG capsule Take 1 capsule (300 mg total) by mouth at bedtime. 04/04/20   Shahmehdi, Valeria Batman, MD  HYDROcodone-acetaminophen (NORCO/VICODIN) 5-325 MG tablet Take 1 tablet by mouth every 4 (four) hours as needed for moderate pain. 03/16/20   Cuthriell, Roderic Palau  D, PA-C  lidocaine (LIDODERM) 5 % Place 2 patches onto the skin 2 (two) times daily. Remove & Discard patch within 12 hours or as directed by MD 04/04/20   Deatra James, MD  Nutritional Supplements (FEEDING SUPPLEMENT, NEPRO CARB STEADY,) LIQD Take 237 mLs by mouth 2 (two) times daily between meals. 04/05/20   Shahmehdi, Valeria Batman, MD  ramipril (ALTACE) 10 MG capsule Take 10 mg by mouth daily. 09/09/18   [provider]  rosuvastatin (CRESTOR) 10 MG tablet Take 10 mg by mouth at bedtime. 01/12/20    [provider]  sevelamer carbonate (RENVELA) 800 MG tablet Take 2,400 mg by mouth 3 (three) times daily. 06/08/18   [provider]  traMADol (ULTRAM) 50 MG tablet Take 1 tablet (50 mg total) by mouth every 12 (twelve) hours as needed for severe pain (for pain no relieved by tylenol). 03/07/20 03/07/21  Earleen Newport, MD  vancomycin (VANCOCIN) 1-5 GM/200ML-% SOLN Inject 200 mLs (1,000 mg total) into the vein every Monday, Wednesday, and Friday with hemodialysis. 04/05/20   Deatra James, MD    Allergies Patient has no known allergies.  Family History  Problem Relation Age of Onset  . Hypertension Brother     Social History Social History   Tobacco Use  . Smoking status: Current Every Day Smoker    Packs/day: 0.25    Types: Cigarettes  . Smokeless tobacco: Never Used  Substance Use Topics  . Alcohol use: No  . Drug use: No    Review of Systems Constitutional: No fever/chills Eyes: No visual changes. ENT: No sore throat. Cardiovascular: Denies chest pain. Respiratory: Denies shortness of breath. Gastrointestinal: No abdominal pain.  No nausea, no vomiting.  No diarrhea.   Genitourinary: Negative for dysuria. Musculoskeletal: Negative for back pain. Skin: Negative for rash. Neurological: Negative for headaches, focal weakness or numbness.  ____________________________________________   PHYSICAL EXAM:  VITAL SIGNS: ED Triage Vitals  Enc Vitals Group     BP 04/21/20 0701 (!) 201/82     Pulse Rate 04/21/20 0701 74     Resp 04/21/20 0701 18     Temp 04/21/20 0701 97.8 F (36.6 C)     Temp Source 04/21/20 0701 Oral     SpO2 04/21/20 0701 97 %     Weight 04/21/20 0702 180 lb (81.6 kg)     Height 04/21/20 0702 5\' 8"  (1.727 m)     Head Circumference --      [facility] --      Pain Score 04/21/20 0702 0   Constitutional: Alert and oriented.  Eyes: Conjunctivae are normal.  ENT      Head: Normocephalic and atraumatic.      Nose: No  congestion/rhinnorhea.      Mouth/Throat: Mucous membranes are moist.      Neck: No stridor. Hematological/Lymphatic/Immunilogical: No cervical lymphadenopathy. Cardiovascular: Normal rate, regular rhythm.  No murmurs, rubs, or gallops.  Respiratory: Normal respiratory effort without tachypnea nor retractions. Breath sounds are clear and equal bilaterally. No wheezes/rales/rhonchi. Gastrointestinal: Soft and non tender. No rebound. No guarding.  Genitourinary: Deferred Musculoskeletal: Normal range of motion in all extremities. No lower extremity edema. Fistula in left lower arm. Neurologic:  Normal speech and language. No gross focal neurologic deficits are appreciated.  Skin:  Skin is warm, dry and intact. No rash noted. Psychiatric: Mood and affect are normal. Speech and behavior are normal. Patient exhibits appropriate insight and judgment.  ____________________________________________    LABS (pertinent positives/negatives)  CBC wbc 5.2, hgb 10.3, plt 160 BMP na 131, k 4.6, gl 86, cr 6.99  ____________________________________________   EKG  I, Nance Pear, attending physician, personally viewed and interpreted this EKG  EKG Time: 0753 Rate: 74 Rhythm: atrial flutter Axis: normal Intervals: qtc 458 QRS: narrow ST changes: no st elevation Impression: abnormal ekg   ____________________________________________    RADIOLOGY  None  ____________________________________________   PROCEDURES  Procedures  ____________________________________________   INITIAL IMPRESSION / ASSESSMENT AND PLAN / ED COURSE  Pertinent labs & imaging results that were available during my care of the patient were reviewed by me and considered in my medical decision making (see chart for details).   Patient presented to the emergency department today from dialysis because of concern for possible seizure.  However given EMS report does not sound like a seizure.  Additionally patient  was not incontinent nor did he have any intraoral trauma.  He was not postictal.  At this point I think seizure very unlikely.  Do wonder if he might have simply fallen asleep given that he had taken some Benadryl this morning.  Blood work was checked.  It does show a slight hyponatremia.  I discussed with Dr. Juleen China with nephrology.  He does think it be reasonable to send patient back to dialysis.  He states that they can correct his electrolyte abnormalities at dialysis.  ____________________________________________   FINAL CLINICAL IMPRESSION(S) / ED DIAGNOSES  Final diagnoses:  Hyponatremia     Note: This dictation was prepared with Dragon dictation. Any transcriptional errors that result from this process are unintentional     Nance Pear, MD 04/21/20 [phone]

## 2020-04-21 NOTE — ED Notes (Signed)
This RN spoke with pt's legal guardian. This RN informed legal guardian of pt discharge back to dialysis.

## 2020-04-21 NOTE — ED Triage Notes (Signed)
Patient coming in from [place] dialysis and he was 20 min into treatment and he dozed off and they called EMS for seizure like activities and said "they wanted him to get the rest of his treatment here."

## 2020-04-21 NOTE — ED Notes (Signed)
Spoke with Maudie Mercury, RN from [employer] - [phone].

## 2020-04-21 NOTE — ED Notes (Signed)
Pt sleepy, oriented, says he is hungry. Denies seizure activity.

## 2020-04-21 NOTE — ED Notes (Signed)
Per tressa with [facility] they cannot finish dialysis today but will contact [facility]

## 2020-04-21 NOTE — Discharge Instructions (Addendum)
Please seek medical attention for any high fevers, chest pain, shortness of breath, change in behavior, persistent vomiting, bloody stool or any other new or concerning symptoms.  

## 2020-06-22 DEATH — deceased
